# Patient Record
Sex: Male | Born: 1960 | Race: White | Hispanic: No | Marital: Married | Smoking: Never smoker
Health system: Southern US, Community
[De-identification: ages and names within clinical notes are randomized; demographics above are authoritative.]

## PROBLEM LIST (undated history)

## (undated) DIAGNOSIS — I10 Essential (primary) hypertension: Secondary | ICD-10-CM

## (undated) DIAGNOSIS — Z87442 Personal history of urinary calculi: Secondary | ICD-10-CM

## (undated) DIAGNOSIS — K219 Gastro-esophageal reflux disease without esophagitis: Secondary | ICD-10-CM

## (undated) DIAGNOSIS — T8859XA Other complications of anesthesia, initial encounter: Secondary | ICD-10-CM

## (undated) DIAGNOSIS — N50819 Testicular pain, unspecified: Secondary | ICD-10-CM

## (undated) DIAGNOSIS — C801 Malignant (primary) neoplasm, unspecified: Secondary | ICD-10-CM

## (undated) DIAGNOSIS — Z87438 Personal history of other diseases of male genital organs: Secondary | ICD-10-CM

## (undated) DIAGNOSIS — H8190 Unspecified disorder of vestibular function, unspecified ear: Secondary | ICD-10-CM

## (undated) DIAGNOSIS — E782 Mixed hyperlipidemia: Secondary | ICD-10-CM

## (undated) DIAGNOSIS — R42 Dizziness and giddiness: Secondary | ICD-10-CM

## (undated) DIAGNOSIS — G4730 Sleep apnea, unspecified: Secondary | ICD-10-CM

## (undated) HISTORY — PX: LITHOTRIPSY: SUR834

## (undated) HISTORY — DX: Personal history of other diseases of male genital organs: Z87.438

## (undated) HISTORY — DX: Testicular pain, unspecified: N50.819

## (undated) HISTORY — DX: Unspecified disorder of vestibular function, unspecified ear: H81.90

## (undated) HISTORY — DX: Mixed hyperlipidemia: E78.2

## (undated) HISTORY — DX: Dizziness and giddiness: R42

---

## 2002-08-01 HISTORY — PX: NASAL SEPTUM SURGERY: SHX37

## 2006-08-01 DIAGNOSIS — N50819 Testicular pain, unspecified: Secondary | ICD-10-CM

## 2006-08-01 HISTORY — DX: Testicular pain, unspecified: N50.819

## 2006-11-05 ENCOUNTER — Emergency Department: Payer: Self-pay | Admitting: Emergency Medicine

## 2006-11-14 ENCOUNTER — Emergency Department: Payer: Self-pay | Admitting: Emergency Medicine

## 2007-03-27 ENCOUNTER — Ambulatory Visit: Payer: Self-pay | Admitting: Unknown Physician Specialty

## 2007-03-29 ENCOUNTER — Ambulatory Visit: Payer: Self-pay | Admitting: Urology

## 2008-04-08 ENCOUNTER — Ambulatory Visit: Payer: Self-pay | Admitting: Urology

## 2008-04-10 ENCOUNTER — Ambulatory Visit: Payer: Self-pay | Admitting: Urology

## 2008-04-17 ENCOUNTER — Ambulatory Visit: Payer: Self-pay | Admitting: Urology

## 2008-04-29 ENCOUNTER — Ambulatory Visit: Payer: Self-pay | Admitting: Urology

## 2010-01-25 IMAGING — CR DG ABDOMEN 1V
1 series · 1 of 1 positions shown · non-contrast
Comparison: none

REASON FOR EXAM: renal calculi-lithotripsy
COMMENTS:

[view not recorded]
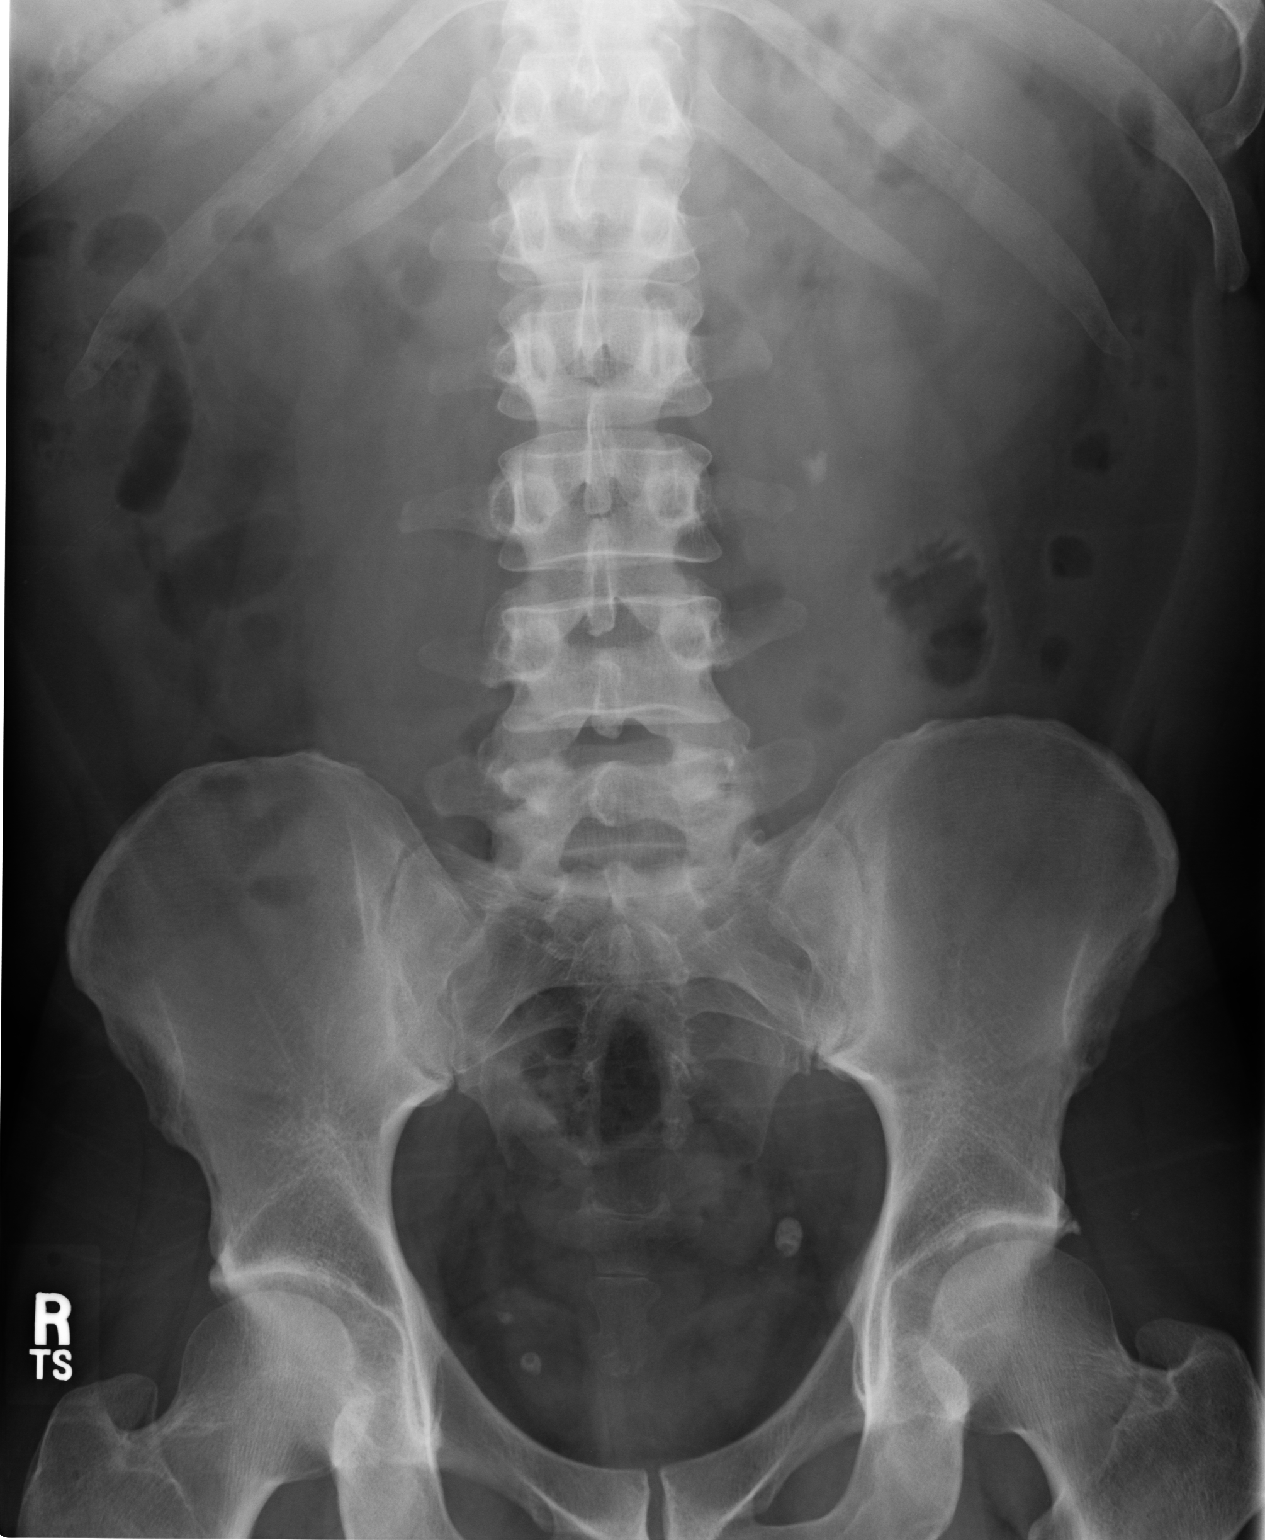

[1 of 1 positions shown; findings below may reference images not displayed]

PROCEDURE:     DXR - DXR KIDNEY URETER BLADDER  - April 17, 2008 [DATE]

RESULT:     Comparison is made to the prior exam of 04/08/2008. There is a 9
mm calcification projected superior to the L3 lumbar transverse process on
the LEFT and consistent with a LEFT ureteral stone that does not appear
appreciably changed in position as compared to the exam of 04/08/2008. No
other calcifications suspicious for renal or ureteral stones are seen.
IMPRESSION: 1.     There is again noted a calcific density superior to the L3 lumbar
transverse process on the LEFT and consistent with a LEFT ureteral stone.

## 2012-01-16 ENCOUNTER — Ambulatory Visit (INDEPENDENT_AMBULATORY_CARE_PROVIDER_SITE_OTHER): Payer: PRIVATE HEALTH INSURANCE | Admitting: Internal Medicine

## 2012-01-16 ENCOUNTER — Encounter: Payer: Self-pay | Admitting: Internal Medicine

## 2012-01-16 VITALS — BP 130/84 | HR 95 | Temp 98.1°F | Resp 16 | Ht 68.0 in | Wt 208.8 lb

## 2012-01-16 DIAGNOSIS — Z131 Encounter for screening for diabetes mellitus: Secondary | ICD-10-CM

## 2012-01-16 DIAGNOSIS — L255 Unspecified contact dermatitis due to plants, except food: Secondary | ICD-10-CM

## 2012-01-16 DIAGNOSIS — E785 Hyperlipidemia, unspecified: Secondary | ICD-10-CM

## 2012-01-16 DIAGNOSIS — Z0000 Encounter for general adult medical examination without abnormal findings: Secondary | ICD-10-CM

## 2012-01-16 DIAGNOSIS — B356 Tinea cruris: Secondary | ICD-10-CM

## 2012-01-16 DIAGNOSIS — L259 Unspecified contact dermatitis, unspecified cause: Secondary | ICD-10-CM

## 2012-01-16 DIAGNOSIS — L237 Allergic contact dermatitis due to plants, except food: Secondary | ICD-10-CM

## 2012-01-16 MED ORDER — NYSTATIN 100000 UNIT/GM EX POWD: Freq: Four times a day (QID) | CUTANEOUS | Status: AC

## 2012-01-16 MED ORDER — PREDNISONE (PAK) 10 MG PO TABS: ORAL_TABLET | ORAL | Status: AC

## 2012-01-16 NOTE — Progress Notes (Signed)
Patient ID: Plummer Matich, male   DOB: June 28, 1961, 51 y.o.   MRN: [id number] Patient Active Problem List  Diagnosis  . Routine general medical examination at a health care facility  . Jock itch  . Episodic recurrent vertigo  . History of acute prostatitis  . Contact dermatitis due to poison ivy    Subjective:  CC:   Chief Complaint  Patient presents with  . Annual Exam    HPI:   Kendricks Reap a 51 y.o. male who presents Annual exam.  Feels generally well.  No insomnia .  Walking for exercise 5 days per week.  No joint pain.  Has been trying to resolve a contact dermatitis on legs/arms for 4 weeks secondary to poison ivy/sumac,  Has tried calamine then fluocinolide ointment borrowed from parents  .  Left lateral legthe worst affected, with large patch covering 50% of lower leg.   2nd issue is recurrence of reflux aggravated by food choices. Not daily.  3rd issue is a skin tag on right inner thigh.    Past Medical History  Diagnosis Date  . Episodic recurrent vertigo     evaluated by Dr. Jenne Campus with normal MRI  . Testicular pain 2008    prostatis, treated with Flomax by Dr. Achilles Dunk  . Episodic recurrent vertigo     normal MRI   . History of acute prostatitis Fall 2008    Past Surgical History  Procedure Date  . Nasal septum surgery 2004         The following portions of the patient's history were reviewed and updated as appropriate: Allergies, current medications, and problem list.    Review of Systems:   12 Pt  review of systems was negative except those addressed in the HPI,     History   Social History  . Marital Status: Married    Spouse Name: N/A    Number of Children: N/A  . Years of Education: N/A   Occupational History  . Not on file.   Social History Main Topics  . Smoking status: Never Smoker   . Smokeless tobacco: Never Used  . Alcohol Use: No  . Drug Use: No  . Sexually Active: Not on file   Other Topics Concern  . Not on file   Social  History Narrative  . No narrative on file    Objective:  BP 130/84  Pulse 95  Temp 98.1 F (36.7 C) (Oral)  Resp 16  Ht 5\' 8"  (1.727 m)  Wt 208 lb 12 oz (94.688 kg)  BMI 31.74 kg/m2  SpO2 98%  General appearance: alert, cooperative and appears stated age Ears: normal TM's and external ear canals both ears Throat: lips, mucosa, and tongue normal; teeth and gums normal Neck: no adenopathy, no carotid bruit, supple, symmetrical, trachea midline and thyroid not enlarged, symmetric, no tenderness/mass/nodules Back: symmetric, no curvature. ROM normal. No CVA tenderness. Lungs: clear to auscultation bilaterally Heart: regular rate and rhythm, S1, S2 normal, no murmur, click, rub or gallop Abdomen: soft, non-tender; bowel sounds normal; no masses,  no organomegaly Pulses: 2+ and symmetric Skin: large erythematous papular rash left lower extremity, several linear stripes on arms.  Inguinal:red macular rash inguinal area. Skin tag ,, pedunculated left inner thigh.  Urologic: testicular exam normal, uncircumcised penis normal Lymph nodes: Cervical, supraclavicular, and axillary nodes normal.  Assessment and Plan:  Routine general medical examination at a health care facility Annual exam done except for DRE.  Visual acuity 20/15 wearing his corrective  lenses  Jock itch Trial of nystatin powder bid x 2 weeks, then switch to Gold Bond.   Contact dermatitis due to poison ivy Persistent,  X 4 weeks,  predisone taper. rxed   Updated Medication List Outpatient Encounter Prescriptions as of 01/16/2012  Medication Sig Dispense Refill  . nystatin (MYCOSTATIN) powder Apply topically 4 (four) times daily.  15 g  0  . predniSONE (STERAPRED UNI-PAK) 10 MG tablet 6 tablets on Day 1 , then reduce by 1 tablet daily until gone  21 tablet  0  . DISCONTD: Tamsulosin HCl (FLOMAX) 0.4 MG CAPS Take by mouth.         Orders Placed This Encounter  Procedures  . Lipid panel  . COMPLETE METABOLIC  PANEL WITH GFR    No Follow-up on file.

## 2012-01-16 NOTE — Assessment & Plan Note (Addendum)
Annual exam done except for DRE.  Visual acuity 20/15 wearing his corrective lenses

## 2012-01-16 NOTE — Patient Instructions (Addendum)
Try pepcid (famotidine) 20 mg as needed for reflux,  Max dose 40 mg daily  ,  Works faster than prilosec  Consider the Low Glycemic Index Diet and 6 smaller meals daily .  This boosts your metabolism and regulates your sugars:   7 AM Low carbohydrate Protein  Shakes (EAS Carb Control  Or Atkins ,  Available everywhere,   In  cases at [employer] )  2.5 carbs  (Add or substitute a toasted sandwhich thin w/ peanut butter)  10 AM: Protein bar by Atkins (snack size,  Chocolate lover's variety at  [employer])    Lunch: sandwich on pita bread or flatbread (Joseph's makes a pita bread and a flat bread , available at [employer] and [employer]; Toufayah makes a low carb flatbread available at [employer] and [employer]) Mission makes a low carb whole wheat tortilla available at [employer] most grocery stores   3 PM:  Mid day :  Another protein bar,  Or a  cheese stick, 1/4 cup of almonds, walnuts, pistachios, pecans, peanuts,  Macadamia nuts  6 PM  Dinner:  "mean and green:"  Meat/chicken/fish, salad, and green veggie : use ranch, vinagrette,  Blue cheese, etc  9 PM snack : Breyer's low carb fudgsicle or  ice cream bar (Carb Smart), or  Weight Watcher's ice cream bar , or another protein shake

## 2012-01-17 ENCOUNTER — Encounter: Payer: Self-pay | Admitting: Internal Medicine

## 2012-01-17 ENCOUNTER — Other Ambulatory Visit (INDEPENDENT_AMBULATORY_CARE_PROVIDER_SITE_OTHER): Payer: PRIVATE HEALTH INSURANCE | Admitting: *Deleted

## 2012-01-17 DIAGNOSIS — L237 Allergic contact dermatitis due to plants, except food: Secondary | ICD-10-CM | POA: Insufficient documentation

## 2012-01-17 DIAGNOSIS — Z131 Encounter for screening for diabetes mellitus: Secondary | ICD-10-CM

## 2012-01-17 DIAGNOSIS — E785 Hyperlipidemia, unspecified: Secondary | ICD-10-CM

## 2012-01-17 DIAGNOSIS — R42 Dizziness and giddiness: Secondary | ICD-10-CM | POA: Insufficient documentation

## 2012-01-17 DIAGNOSIS — Z87438 Personal history of other diseases of male genital organs: Secondary | ICD-10-CM | POA: Insufficient documentation

## 2012-01-17 LAB — COMPLETE METABOLIC PANEL WITH GFR
ALT: 14 U/L (ref 0–53)
AST: 17 U/L (ref 0–37)
Albumin: 4.5 g/dL (ref 3.5–5.2)
Alkaline Phosphatase: 61 U/L (ref 39–117)
GFR, Est Non African American: >89 mL/min
Glucose, Bld: 116 mg/dL — ABNORMAL HIGH (ref 70–99)
Potassium: 4.6 mEq/L (ref 3.5–5.3)
Sodium: 140 mEq/L (ref 135–145)
Total Bilirubin: 0.6 mg/dL (ref 0.3–1.2)
Total Protein: 7.3 g/dL (ref 6.0–8.3)

## 2012-01-17 LAB — LIPID PANEL
HDL: 49.1 mg/dL (ref >39.00)
Total CHOL/HDL Ratio: 5
Triglycerides: 115 mg/dL (ref 0.0–149.0)
VLDL: 23 mg/dL (ref 0.0–40.0)

## 2012-01-17 NOTE — Assessment & Plan Note (Signed)
Trial of nystatin powder bid x 2 weeks, then switch to Gold Bond.

## 2012-01-17 NOTE — Assessment & Plan Note (Signed)
Persistent,  X 4 weeks,  predisone taper. rxed

## 2012-01-27 ENCOUNTER — Encounter: Payer: Self-pay | Admitting: Internal Medicine

## 2012-03-06 ENCOUNTER — Telehealth: Payer: Self-pay | Admitting: Internal Medicine

## 2012-03-06 DIAGNOSIS — L255 Unspecified contact dermatitis due to plants, except food: Secondary | ICD-10-CM

## 2012-03-06 MED ORDER — PREDNISONE (PAK) 10 MG PO TABS: ORAL_TABLET | ORAL | Status: DC

## 2012-03-06 NOTE — Telephone Encounter (Signed)
Left message asking patient to call back

## 2012-03-06 NOTE — Telephone Encounter (Signed)
Caller: Tyler Peters; PCP: Duncan Dull; CB#: [phone]; Call regarding Poison Ivy/[place]ak;  Not responding to home treatment.  Onset 03/01/12.  Areas involved:  right lower leg, arm, chest axilla, thigh. Advised see in 24 hours per Poison [place], [place],  or [facility] protocol.  Caller requests to be seen today if possible and none available in Epic.    Note to office for possible work in appointment.

## 2012-03-06 NOTE — Telephone Encounter (Signed)
Patient notified appt scheduled for tomorrow.

## 2012-03-06 NOTE — Telephone Encounter (Signed)
Yes, but if he is diffusely covered in poisoin ivy,  He needs a prednsione dose pack to start today.  I  will call in to his pharmacy.  Follow up lateri n week with me.

## 2012-03-06 NOTE — Telephone Encounter (Signed)
Dr.Tullo you have no appts available for today, is it ok for patient to be seen tomorrow?

## 2012-03-07 ENCOUNTER — Ambulatory Visit (INDEPENDENT_AMBULATORY_CARE_PROVIDER_SITE_OTHER): Payer: PRIVATE HEALTH INSURANCE | Admitting: Internal Medicine

## 2012-03-07 ENCOUNTER — Encounter: Payer: Self-pay | Admitting: Internal Medicine

## 2012-03-07 VITALS — BP 126/94 | HR 114 | Temp 98.6°F | Resp 16 | Wt 207.5 lb

## 2012-03-07 DIAGNOSIS — L255 Unspecified contact dermatitis due to plants, except food: Secondary | ICD-10-CM

## 2012-03-07 DIAGNOSIS — L237 Allergic contact dermatitis due to plants, except food: Secondary | ICD-10-CM

## 2012-03-08 ENCOUNTER — Encounter: Payer: Self-pay | Admitting: Internal Medicine

## 2012-03-08 DIAGNOSIS — E782 Mixed hyperlipidemia: Secondary | ICD-10-CM | POA: Insufficient documentation

## 2012-03-08 NOTE — Progress Notes (Signed)
Patient ID: Tyler Peters, male   DOB: 07-31-1961, 51 y.o.   MRN: [id number]  Patient Active Problem List  Diagnosis  . Routine general medical examination at a health care facility  . Jock itch  . Episodic recurrent vertigo  . History of acute prostatitis  . Contact dermatitis due to poison ivy  . Hyperlipidemia, mixed    Subjective:  CC:   Chief Complaint  Patient presents with  . Poison Ivy    HPI:   Tyler Peters a 51 y.o. male who presents Contact dermatitis secondary to contact with poison ivy in his yard.  Has spread to arms and axilla from right LE.  Using an OTC soap that has helped to remove the oils,  But leg is very enflamed, and priro treatment with prednisone taper worked rapidly to relieve disomfort.    Past Medical History  Diagnosis Date  . Episodic recurrent vertigo     evaluated by Dr. Jenne Campus with normal MRI  . Testicular pain 2008    prostatis, treated with Flomax by Dr. Achilles Dunk  . Episodic recurrent vertigo     normal MRI   . History of acute prostatitis Fall 2008  . Hyperlipidemia, mixed     Past Surgical History  Procedure Date  . Nasal septum surgery 2004         The following portions of the patient's history were reviewed and updated as appropriate: Allergies, current medications, and problem list.    Review of Systems:   12 Pt  review of systems was negative except those addressed in the HPI,     History   Social History  . Marital Status: Married    Spouse Name: N/A    Number of Children: N/A  . Years of Education: N/A   Occupational History  . Not on file.   Social History Main Topics  . Smoking status: Never Smoker   . Smokeless tobacco: Never Used  . Alcohol Use: No  . Drug Use: No  . Sexually Active: Not on file   Other Topics Concern  . Not on file   Social History Narrative  . No narrative on file    Objective:  BP 126/94  Pulse 114  Temp 98.6 F (37 C) (Oral)  Resp 16  Wt 207 lb 8 oz (94.121 kg)   SpO2 96%  General appearance: alert, cooperative and appears stated age  Skin: 6 cm ellipitical shaped  patch of erythema, well demarcated,  Right shin.  Scattered linear streaks on both forearms. No cellulitis.  Lymph nodes: Cervical, supraclavicular, and axillary nodes normal.  Assessment and Plan:  Contact dermatitis due to poison ivy Repeat episode due to contact with poisin ivy .  6 Day steroid taper prescribed.    Updated Medication List Outpatient Encounter Prescriptions as of 03/07/2012  Medication Sig Dispense Refill  . nystatin (MYCOSTATIN) powder Apply topically 4 (four) times daily.  15 g  0  . predniSONE (STERAPRED UNI-PAK) 10 MG tablet 6 tablets on Day 1 , then reduce by 1 tablet daily until gone  21 tablet  0     No orders of the defined types were placed in this encounter.    No Follow-up on file.

## 2012-03-08 NOTE — Assessment & Plan Note (Signed)
Repeat episode due to contact with poisin ivy .  6 Day steroid taper prescribed.

## 2012-03-12 ENCOUNTER — Encounter: Payer: Self-pay | Admitting: Internal Medicine

## 2012-03-12 DIAGNOSIS — L309 Dermatitis, unspecified: Secondary | ICD-10-CM

## 2012-03-12 MED ORDER — PREDNISONE 10 MG PO TABS: ORAL_TABLET | ORAL | Status: DC

## 2012-09-16 ENCOUNTER — Other Ambulatory Visit: Payer: Self-pay

## 2013-01-16 ENCOUNTER — Encounter: Payer: PRIVATE HEALTH INSURANCE | Admitting: Internal Medicine

## 2013-04-16 ENCOUNTER — Encounter: Payer: PRIVATE HEALTH INSURANCE | Admitting: Internal Medicine

## 2013-06-03 ENCOUNTER — Encounter: Payer: Self-pay | Admitting: Internal Medicine

## 2013-06-03 ENCOUNTER — Ambulatory Visit (INDEPENDENT_AMBULATORY_CARE_PROVIDER_SITE_OTHER): Payer: Managed Care, Other (non HMO) | Admitting: Internal Medicine

## 2013-06-03 VITALS — BP 130/94 | HR 86 | Temp 98.7°F | Resp 12 | Ht 68.5 in | Wt 215.0 lb

## 2013-06-03 DIAGNOSIS — I10 Essential (primary) hypertension: Secondary | ICD-10-CM | POA: Insufficient documentation

## 2013-06-03 DIAGNOSIS — E785 Hyperlipidemia, unspecified: Secondary | ICD-10-CM

## 2013-06-03 DIAGNOSIS — R030 Elevated blood-pressure reading, without diagnosis of hypertension: Secondary | ICD-10-CM

## 2013-06-03 DIAGNOSIS — E559 Vitamin D deficiency, unspecified: Secondary | ICD-10-CM

## 2013-06-03 DIAGNOSIS — R5381 Other malaise: Secondary | ICD-10-CM

## 2013-06-03 DIAGNOSIS — Z0000 Encounter for general adult medical examination without abnormal findings: Secondary | ICD-10-CM

## 2013-06-03 DIAGNOSIS — E669 Obesity, unspecified: Secondary | ICD-10-CM

## 2013-06-03 DIAGNOSIS — Z23 Encounter for immunization: Secondary | ICD-10-CM

## 2013-06-03 LAB — CBC WITH DIFFERENTIAL/PLATELET
Basophils Absolute: 0 10*3/uL (ref 0.0–0.1)
Basophils Relative: 0.2 % (ref 0.0–3.0)
Eosinophils Absolute: 0.1 10*3/uL (ref 0.0–0.7)
Lymphocytes Relative: 19.8 % (ref 12.0–46.0)
MCHC: 33 g/dL (ref 30.0–36.0)
Neutrophils Relative %: 72.1 % (ref 43.0–77.0)
RBC: 5.55 Mil/uL (ref 4.22–5.81)
RDW: 14.1 % (ref 11.5–14.6)

## 2013-06-03 LAB — TSH: TSH: 1.36 u[IU]/mL (ref 0.35–5.50)

## 2013-06-03 LAB — LIPID PANEL
Cholesterol: 216 mg/dL — ABNORMAL HIGH (ref 0–200)
Triglycerides: 232 mg/dL — ABNORMAL HIGH (ref 0.0–149.0)

## 2013-06-03 LAB — COMPREHENSIVE METABOLIC PANEL
ALT: 21 U/L (ref 0–53)
CO2: 28 mEq/L (ref 19–32)
Calcium: 9.5 mg/dL (ref 8.4–10.5)
Chloride: 103 mEq/L (ref 96–112)
Creatinine, Ser: 1.1 mg/dL (ref 0.4–1.5)
GFR: 77.78 mL/min (ref >60.00)

## 2013-06-03 NOTE — Assessment & Plan Note (Signed)
Patient checks BP at [facility] and is consistently < 120/80.  No treatment today. Return for fasting labs.

## 2013-06-03 NOTE — Assessment & Plan Note (Signed)
Annual male exam was done including testicular  exam.

## 2013-06-03 NOTE — Assessment & Plan Note (Signed)
I have addressed  BMI and recommended wt loss of 10% of body weigh over the next 6 months using a low glycemic index diet and regular exercise a minimum of 5 days per week.   

## 2013-06-03 NOTE — Progress Notes (Signed)
Patient ID: Tyler Peters, male   DOB: April 04, 1961, 52 y.o.   MRN: [id number]  The patient is here for his annual male physical examination. He was last seen August 2013.  Still receivng "balance due" charges from [facility] for the prior visit that was supposed to be written off.   No new issues other than recurrent sinus congestion , sore throat,  Some cough and PND.  No fevers or myalgias.     The risk factors are reflected in the social history.  The roster of all physicians providing medical care to patient - is listed in the Snapshot section of the chart.  Activities of daily living:  The patient is 100% independent in all ADLs: dressing, toileting, feeding as well as independent mobility  Home safety : The patient has smoke detectors in the home. He wears seatbelts.  There are no firearms at home. There is no violence in the home.   There is no risks for hepatitis, STDs or HIV. There is no   history of blood transfusion. There is no travel history to infectious disease endemic areas of the world.  The patient has seen their dentist in the last six month and  their eye doctor in the last year.  They do not  have excessive sun exposure. They have seen a dermatoloigist in the last year. Discussed the need for sun protection: hats, long sleeves and use of sunscreen if there is significant sun exposure.   Diet: the importance of a healthy diet is discussed. They do have a healthy diet.  The benefits of regular aerobic exercise were discussed. He exercises a minimum of 30 minutes  5 days per week. Depression screen: there are no signs or vegative symptoms of depression- irritability, change in appetite, anhedonia, sadness/tearfullness.  The following portions of the patient's history were reviewed and updated as appropriate: allergies, current medications, past family history, past medical history,  past surgical history, past social history  and problem list.  Visual acuity was not assessed per  patient preference since he has regular follow up with his ophthalmologist. Hearing and body mass index were assessed and reviewed.   During the course of the visit the patient was educated and counseled about appropriate screening and preventive services including :  nutrition counseling, colorectal cancer screening, and recommended immunizations.    Objective:   BP 130/94  Pulse 86  Temp(Src) 98.7 F (37.1 C) (Oral)  Resp 12  Ht 5' 8.5" (1.74 m)  Wt 215 lb (97.523 kg)  BMI 32.21 kg/m2  SpO2 98%  General Appearance:    Alert, cooperative, no distress, appears stated age  Head:    Normocephalic, without obvious abnormality, atraumatic  Eyes:    PERRL, conjunctiva/corneas clear, EOM's intact, fundi    benign, both eyes       Ears:    Normal TM's and external ear canals, both ears  Nose:   Nares normal, septum midline, mucosa normal, no drainage   or sinus tenderness  Throat:   Lips, mucosa, and tongue normal; teeth and gums normal  Neck:   Supple, symmetrical, trachea midline, no adenopathy;       thyroid:  No enlargement/tenderness/nodules; no carotid   bruit or JVD  Back:     Symmetric, no curvature, ROM normal, no CVA tenderness  Lungs:     Clear to auscultation bilaterally, respirations unlabored  Chest wall:    No tenderness or deformity  Heart:    Regular rate and rhythm, S1 and  S2 normal, no murmur, rub   or gallop  Abdomen:     Soft, non-tender, bowel sounds active all four quadrants,    no masses, no organomegaly  Genitalia:    Normal male without lesion, discharge or tendernessNo inguinal hernias or testicular massess.     Extremities:   Extremities normal, atraumatic, no cyanosis or edema  Pulses:   2+ and symmetric all extremities  Skin:   Skin color, texture, turgor normal, no rashes or lesions  Lymph nodes:   Cervical, supraclavicular, and axillary nodes normal  Neurologic:   CNII-XII intact. Normal strength, sensation and reflexes      throughout   Assessment  and plan:   Routine general medical examination at a health care facility Annual male exam was done including testicular  exam.   White coat syndrome without diagnosis of hypertension [facility] and is consistently < 120/80.  No treatment today. Return for fasting labs.  Obesity, unspecified I have addressed  BMI and recommended wt loss of 10% of body weigh over the next 6 months using a low glycemic index diet and regular exercise a minimum of 5 days per week.     Updated Medication List Outpatient Encounter Prescriptions as of 06/03/2013  Medication Sig  . [DISCONTINUED] predniSONE (DELTASONE) 10 MG tablet 3 tablets daily for 2 days , decrease by 1 tablet every 2 days until gone.

## 2014-11-10 ENCOUNTER — Encounter: Payer: Self-pay | Admitting: Internal Medicine

## 2014-11-10 ENCOUNTER — Ambulatory Visit (INDEPENDENT_AMBULATORY_CARE_PROVIDER_SITE_OTHER): Payer: Managed Care, Other (non HMO) | Admitting: Internal Medicine

## 2014-11-10 VITALS — BP 128/70 | HR 96 | Temp 98.3°F | Resp 16 | Ht 67.0 in | Wt 217.5 lb

## 2014-11-10 DIAGNOSIS — R05 Cough: Secondary | ICD-10-CM

## 2014-11-10 DIAGNOSIS — J209 Acute bronchitis, unspecified: Secondary | ICD-10-CM | POA: Diagnosis not present

## 2014-11-10 DIAGNOSIS — R059 Cough, unspecified: Secondary | ICD-10-CM

## 2014-11-10 MED ORDER — HYDROCOD POLST-CHLORPHEN POLST 10-8 MG/5ML PO LQCR: 5.0000 mL | Freq: Every evening | ORAL | Status: DC | PRN

## 2014-11-10 MED ORDER — PREDNISONE 10 MG PO TABS: ORAL_TABLET | ORAL | Status: DC

## 2014-11-10 MED ORDER — BENZONATATE 200 MG PO CAPS: 200.0000 mg | ORAL_CAPSULE | Freq: Three times a day (TID) | ORAL | Status: DC | PRN

## 2014-11-10 MED ORDER — AZITHROMYCIN 500 MG PO TABS: 500.0000 mg | ORAL_TABLET | Freq: Every day | ORAL | Status: DC

## 2014-11-10 NOTE — Progress Notes (Signed)
Pre-visit discussion using our clinic review tool. No additional management support is needed unless otherwise documented below in the visit note.  

## 2014-11-10 NOTE — Progress Notes (Signed)
Patient ID: Tyler Peters, male   DOB: May 25, 1961, 54 y.o.   MRN: [id number]  Patient Active Problem List   Diagnosis Date Noted  . Acute bronchitis 11/11/2014  . White coat syndrome without diagnosis of hypertension 06/03/2013  . Obesity, unspecified 06/03/2013  . Hyperlipidemia, mixed   . Episodic recurrent vertigo   . History of acute prostatitis   . Routine general medical examination at a health care facility 01/16/2012    Subjective:  CC:   Chief Complaint  Patient presents with  . Cough    X 4 weeks sore throat and achy to start in March now head congestion and runny nose.  . Sinusitis    Drainage to back of throat. cough productive at times does not know color.    HPI:   Tyler Peters is a 54 y.o. male who presents for 4 week history of cough .  Symptoms started the weekend before Easter with a sore throat,  Generalized body aches, so severe he left work early.  Slept for 6 hours that day same day .  achiness improved but sinus congestion persisted.  She has been having daily headaches,  Copious sinus drainage and cough.  Chest sometimes hurts to cough .  Right maxillary pain ,  Cough makes his head hurt on the right,  Tried all otc decongestants and Mucinex     Past Medical History  Diagnosis Date  . Episodic recurrent vertigo     evaluated by Dr. Tami Ribas with normal MRI  . Testicular pain 2008    prostatis, treated with Flomax by Dr. Jacqlyn Larsen  . Episodic recurrent vertigo     normal MRI   . History of acute prostatitis Fall 2008  . Hyperlipidemia, mixed     Past Surgical History  Procedure Laterality Date  . Nasal septum surgery  2004       The following portions of the patient's history were reviewed and updated as appropriate: Allergies, current medications, and problem list.    Review of Systems:   Patient denies headache, fevers, malaise, unintentional weight loss, skin rash, eye pain, sinus congestion and sinus pain, sore throat, dysphagia,   hemoptysis , cough, dyspnea, wheezing, chest pain, palpitations, orthopnea, edema, abdominal pain, nausea, melena, diarrhea, constipation, flank pain, dysuria, hematuria, urinary  Frequency, nocturia, numbness, tingling, seizures,  Focal weakness, Loss of consciousness,  Tremor, insomnia, depression, anxiety, and suicidal ideation.     History   Social History  . Marital Status: Married    Spouse Name: N/A  . Number of Children: N/A  . Years of Education: N/A   Occupational History  . Not on file.   Social History Main Topics  . Smoking status: Never Smoker   . Smokeless tobacco: Never Used  . Alcohol Use: No  . Drug Use: No  . Sexual Activity: Not on file   Other Topics Concern  . Not on file   Social History Narrative    Objective:  Filed Vitals:   11/10/14 1651  BP: 128/70  Pulse: 96  Temp: 98.3 F (36.8 C)  Resp: 16     General appearance: alert, cooperative and appears stated age Ears: normal TM's and external ear canals both ears Throat: lips, mucosa, and tongue normal; teeth and gums normal Neck: no adenopathy, no carotid bruit, supple, symmetrical, trachea midline and thyroid not enlarged, symmetric, no tenderness/mass/nodules Back: symmetric, no curvature. ROM normal. No CVA tenderness. Lungs: clear to auscultation bilaterally Heart: regular rate and rhythm, S1, S2  normal, no murmur, click, rub or gallop Abdomen: soft, non-tender; bowel sounds normal; no masses,  no organomegaly Pulses: 2+ and symmetric Skin: Skin color, texture, turgor normal. No rashes or lesions Lymph nodes: Cervical, supraclavicular, and axillary nodes normal.  Assessment and Plan:  Acute bronchitis Suspect pertussis.  Nasal swab pending.  Treat with azithromycin,  Prednisone and cough suppression.      Updated Medication List Outpatient Encounter Prescriptions as of 11/10/2014  Medication Sig  . dextromethorphan-guaiFENesin (MUCINEX DM) 30-600 MG per 12 hr tablet Take 1 tablet  by mouth 2 (two) times daily.  . Pseudoeph-Doxylamine-DM-APAP (NYQUIL PO) Take 30 mLs by mouth at bedtime.  . Pseudoephedrine-APAP-DM (DAYQUIL PO) Take 2 capsules by mouth 2 (two) times daily.  Marland Kitchen azithromycin (ZITHROMAX) 500 MG tablet Take 1 tablet (500 mg total) by mouth daily.  . benzonatate (TESSALON) 200 MG capsule Take 1 capsule (200 mg total) by mouth 3 (three) times daily as needed for cough.  . chlorpheniramine-HYDROcodone (TUSSIONEX) 10-8 MG/5ML LQCR Take 5 mLs by mouth at bedtime as needed for cough.  . predniSONE (DELTASONE) 10 MG tablet 6 tablets on Day 1 , then reduce by 1 tablet daily until gone     Orders Placed This Encounter  Procedures  . Bordetella Pertussis PCR    No Follow-up on file.

## 2014-11-10 NOTE — Patient Instructions (Addendum)
I am treating you for pertussis based on the severity of your cough and the chroncity of it.   Azithromycin x 7 days and prednisone taper. Tessalon for daytime cough and tussionex for nighttime cough  Flush sinues twice daily with Neilmed sinus rinse  Add benadryl for sinus drainage  If no improvement in one week  Call for chest x ray and second round of abx    Please take a probiotic ( Align, Floraque or Culturelle) , or the generic equivalent for a minimum of 3 weeks while you are on the antibiotic to prevent a serious antibiotic associated diarrhea  Called clostridium dificile colitis

## 2014-11-11 ENCOUNTER — Encounter: Payer: Self-pay | Admitting: Internal Medicine

## 2014-11-11 DIAGNOSIS — J209 Acute bronchitis, unspecified: Secondary | ICD-10-CM | POA: Insufficient documentation

## 2014-11-11 NOTE — Assessment & Plan Note (Signed)
Suspect pertussis.  Nasal swab pending.  Treat with azithromycin,  Prednisone and cough suppression.

## 2014-11-12 LAB — BORDETELLA PERTUSSIS PCR
B parapertussis, DNA: NOT DETECTED
B pertussis, DNA: NOT DETECTED

## 2014-11-14 ENCOUNTER — Encounter: Payer: Self-pay | Admitting: Internal Medicine

## 2015-01-07 ENCOUNTER — Encounter: Payer: Self-pay | Admitting: Internal Medicine

## 2015-01-07 MED ORDER — PREDNISONE 10 MG PO TABS: ORAL_TABLET | ORAL | Status: DC

## 2015-11-10 ENCOUNTER — Encounter: Payer: Self-pay | Admitting: Internal Medicine

## 2015-11-10 ENCOUNTER — Ambulatory Visit (INDEPENDENT_AMBULATORY_CARE_PROVIDER_SITE_OTHER): Payer: Managed Care, Other (non HMO) | Admitting: Internal Medicine

## 2015-11-10 VITALS — BP 148/100 | HR 113 | Temp 97.9°F | Resp 12 | Ht 68.0 in | Wt 225.5 lb

## 2015-11-10 DIAGNOSIS — IMO0001 Reserved for inherently not codable concepts without codable children: Secondary | ICD-10-CM

## 2015-11-10 DIAGNOSIS — E669 Obesity, unspecified: Secondary | ICD-10-CM

## 2015-11-10 DIAGNOSIS — E785 Hyperlipidemia, unspecified: Secondary | ICD-10-CM | POA: Diagnosis not present

## 2015-11-10 DIAGNOSIS — R5383 Other fatigue: Secondary | ICD-10-CM

## 2015-11-10 DIAGNOSIS — E559 Vitamin D deficiency, unspecified: Secondary | ICD-10-CM | POA: Diagnosis not present

## 2015-11-10 DIAGNOSIS — Z7289 Other problems related to lifestyle: Secondary | ICD-10-CM | POA: Diagnosis not present

## 2015-11-10 DIAGNOSIS — R635 Abnormal weight gain: Secondary | ICD-10-CM | POA: Diagnosis not present

## 2015-11-10 DIAGNOSIS — Z0000 Encounter for general adult medical examination without abnormal findings: Secondary | ICD-10-CM

## 2015-11-10 DIAGNOSIS — Z125 Encounter for screening for malignant neoplasm of prostate: Secondary | ICD-10-CM | POA: Diagnosis not present

## 2015-11-10 DIAGNOSIS — R030 Elevated blood-pressure reading, without diagnosis of hypertension: Secondary | ICD-10-CM | POA: Diagnosis not present

## 2015-11-10 DIAGNOSIS — E782 Mixed hyperlipidemia: Secondary | ICD-10-CM

## 2015-11-10 DIAGNOSIS — R0683 Snoring: Secondary | ICD-10-CM

## 2015-11-10 NOTE — Progress Notes (Signed)
Patient ID: Tyler Peters, male    DOB: 10-Feb-1961  Age: 55 y.o. MRN: [id number]  The patient is here for annual  wellness examination and management of other chronic and acute problems.   The risk factors are reflected in the social history.  The roster of all physicians providing medical care to patient - is listed in the Snapshot section of the chart.  Activities of daily living:  The patient is 100% independent in all ADLs: dressing, toileting, feeding as well as independent mobility  Home safety : The patient has smoke detectors in the home. They wear seatbelts.  There are no firearms at home. There is no violence in the home.   There is no risks for hepatitis, STDs or HIV. There is no   history of blood transfusion. They have no travel history to infectious disease endemic areas of the world.  The patient has seen their dentist in the last six month. They have seen their eye doctor in the last year. They admit to slight hearing difficulty with regard to whispered voices and some television programs.  They have deferred audiologic testing in the last year.  They do not  have excessive sun exposure. Discussed the need for sun protection: hats, long sleeves and use of sunscreen if there is significant sun exposure.   Diet: the importance of a healthy diet is discussed. They do not have a healthy diet  The benefits of regular aerobic exercise were discussed. He is not exercising regularly and has a gained 8 lbs since last year's visit.    Depression screen: there are no signs or vegative symptoms of depression- irritability, change in appetite, anhedonia, sadness/tearfullness.  Cognitive assessment: the patient manages all their financial and personal affairs and is actively engaged. They could relate day,date,year and events; recalled 2/3 objects at 3 minutes; performed clock-face test normally.  The following portions of the patient's history were reviewed and updated as appropriate:  allergies, current medications, past family history, past medical history,  past surgical history, past social history  and problem list.  Visual acuity was not assessed per patient preference since she has regular follow up with her ophthalmologist. Hearing and body mass index were assessed and reviewed.   During the course of the visit the patient was educated and counseled about appropriate screening and preventive services including : fall prevention , diabetes screening, nutrition counseling, colorectal cancer screening, and recommended immunizations.    CC: The primary encounter diagnosis was Other problems related to lifestyle. Diagnoses of Hyperlipidemia, Weight gain, Elevated blood pressure, Vitamin D deficiency, Other fatigue, Prostate cancer screening, Blood pressure elevated without history of HTN, Encounter for preventive health examination, White coat syndrome without diagnosis of hypertension, Hyperlipidemia, mixed, Obesity, unspecified, and Snoring were also pertinent to this visit.   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  averaging 60 hours per week .  Not exercising.  Eats fast food two times per week with french fries . Eats  Fish once a week .  No deep frying ,  Most meats are baked. Pasta 2-3 /week, rice 1/wk,  Potatoes 1/wk in addition to the FF.    No ETOH.Marland Kitchen ice cream rare,  Candy bar once in a while  No sweet tea , but drinks  One glass of soda daily .     History Tyler Peters has a past medical history of Episodic recurrent vertigo; Testicular pain (2008); Episodic recurrent vertigo; History  of acute prostatitis (Fall 2008); and Hyperlipidemia, mixed.   He has past surgical history that includes Nasal septum surgery (2004).   His family history includes Diabetes in his maternal grandmother; Heart disease in his father and maternal grandmother.He reports that he has never smoked. He has never used smokeless tobacco. He reports  that he does not drink alcohol or use illicit drugs.  Outpatient Prescriptions Prior to Visit  Medication Sig Dispense Refill  . azithromycin (ZITHROMAX) 500 MG tablet Take 1 tablet (500 mg total) by mouth daily. 7 tablet 0  . benzonatate (TESSALON) 200 MG capsule Take 1 capsule (200 mg total) by mouth 3 (three) times daily as needed for cough. 60 capsule 1  . chlorpheniramine-HYDROcodone (TUSSIONEX) 10-8 MG/5ML LQCR Take 5 mLs by mouth at bedtime as needed for cough. 180 mL 0  . dextromethorphan-guaiFENesin (MUCINEX DM) 30-600 MG per 12 hr tablet Take 1 tablet by mouth 2 (two) times daily.    . predniSONE (DELTASONE) 10 MG tablet 6 tablets on Day 1 , then reduce by 1 tablet daily until gone 21 tablet 0  . predniSONE (DELTASONE) 10 MG tablet 6 tablets on Days 1 and 2 , then reduce by 1 tablet daily until gone 27 tablet 0  . Pseudoeph-Doxylamine-DM-APAP (NYQUIL PO) Take 30 mLs by mouth at bedtime.    . Pseudoephedrine-APAP-DM (DAYQUIL PO) Take 2 capsules by mouth 2 (two) times daily.     No facility-administered medications prior to visit.    Review of Systems   Patient denies headache, fevers, malaise, unintentional weight loss, skin rash, eye pain, sinus congestion and sinus pain, sore throat, dysphagia,  hemoptysis , cough, dyspnea, wheezing, chest pain, palpitations, orthopnea, edema, abdominal pain, nausea, melena, diarrhea, constipation, flank pain, dysuria, hematuria, urinary  Frequency, nocturia, numbness, tingling, seizures,  Focal weakness, Loss of consciousness,  Tremor, insomnia, depression, anxiety, and suicidal ideation.      Objective:  BP 148/100 mmHg  Pulse 113  Temp(Src) 97.9 F (36.6 C) (Oral)  Resp 12  Ht 5\' 8"  (1.727 m)  Wt 225 lb 8 oz (102.286 kg)  BMI 34.30 kg/m2  SpO2 94%  Physical Exam  General appearance: obese, alert, cooperative and appears stated age Ears: normal TM's and external ear canals both ears Throat: lips, mucosa, and tongue normal; teeth and  gums normal Neck: no adenopathy, no carotid bruit, supple, symmetrical, trachea midline and thyroid not enlarged, symmetric, no tenderness/mass/nodules Back: symmetric, no curvature. ROM normal. No CVA tenderness. Lungs: clear to auscultation bilaterally Heart: regular rate and rhythm, S1, S2 normal, no murmur, click, rub or gallop Abdomen: soft, non-tender; bowel sounds normal; no masses,  no organomegaly Pulses: 2+ and symmetric Skin: Skin color, texture, turgor normal. No rashes or lesions Lymph nodes: Cervical, supraclavicular, and axillary nodes normal.   Assessment & Plan:   Problem List Items Addressed This Visit    Encounter for preventive health examination    Annual comprehensive preventive exam was done as well as an evaluation and management of acute and chronic conditions .  During the course of the visit the patient was educated and counseled about appropriate screening and preventive services including :  diabetes screening, lipid analysis with projected  10 year  risk for CAD , nutrition counseling, prostate and colorectal cancer screening, and recommended immunizations.  Printed recommendations for health maintenance screenings was given.       Hyperlipidemia, mixed    Based on your current lipid profile, the risk of clinically significant CAD is 16 %  over the next 10 years, using the Framingham risk calculator. Statin therapy will be advised. .    Lab Results  Component Value Date   CHOL 236* 11/10/2015   HDL 44.00 11/10/2015   LDLDIRECT 147.0 11/10/2015   TRIG 239.0* 11/10/2015   CHOLHDL 5 11/10/2015         White coat syndrome without diagnosis of hypertension    He has not been checking his BP at home and BP remains elevated upon recheck. Possible secondary to untreated OSA given history of snoring. Patient advised to purchase a home BP device and check pressures once daily until diagnosis can be confirmed.   Lab Results  Component Value Date   CREATININE  0.97 11/10/2015   Lab Results  Component Value Date   NA 142 11/10/2015   K 4.0 11/10/2015   CL 101 11/10/2015   CO2 29 11/10/2015   No results found for: MICROALBUR, [id number]      Obesity, unspecified    Weight gain addressed.  I have addressed  BMI and recommended wt loss of 10% of body weigh over the next 6 months using a low glycemic index diet and regular exercise a minimum of 5 days per week.        Snoring    Advised to consider sleep study given his obesity, snoring and elevated blood pressure.        Other Visit Diagnoses    Other problems related to lifestyle    -  Primary    Relevant Orders    Hepatitis C antibody (Completed)    HIV antibody (Completed)    Hyperlipidemia        Relevant Orders    Lipid panel (Completed)    Weight gain        Relevant Orders    TSH (Completed)    Elevated blood pressure        Relevant Orders    Comprehensive metabolic panel (Completed)    Vitamin D deficiency        Relevant Orders    VITAMIN D 25 Hydroxy (Vit-D Deficiency, Fractures) (Completed)    Other fatigue        Relevant Orders    CBC with Differential/Platelet (Completed)    Prostate cancer screening        Relevant Orders    PSA (Completed)    Blood pressure elevated without history of HTN        Relevant Orders    DME Other see comment       I have discontinued Mr. Drath Pseudoephedrine-APAP-DM (DAYQUIL PO), Pseudoeph-Doxylamine-DM-APAP (NYQUIL PO), dextromethorphan-guaiFENesin, azithromycin, predniSONE, benzonatate, chlorpheniramine-HYDROcodone, and predniSONE. I am also having him maintain his multivitamin with minerals.  Meds ordered this encounter  Medications  . Multiple Vitamins-Minerals (MULTIVITAMIN WITH MINERALS) tablet    Sig: Take 1 tablet by mouth daily.    Medications Discontinued During This Encounter  Medication Reason  . azithromycin (ZITHROMAX) 500 MG tablet Completed Course  . benzonatate (TESSALON) 200 MG capsule Completed  Course  . chlorpheniramine-HYDROcodone (TUSSIONEX) 10-8 MG/5ML LQCR Completed Course  . dextromethorphan-guaiFENesin (MUCINEX DM) 30-600 MG per 12 hr tablet Error  . predniSONE (DELTASONE) 10 MG tablet Completed Course  . predniSONE (DELTASONE) 10 MG tablet Error  . Pseudoeph-Doxylamine-DM-APAP (NYQUIL PO) Error  . Pseudoephedrine-APAP-DM (DAYQUIL PO) Error    Follow-up: No Follow-up on file.   Crecencio Mc, MD

## 2015-11-10 NOTE — Patient Instructions (Addendum)
Please purchase a BP cuff and check your BP at home  Goal is 130/80 or less; let me know what readings you are getting   Consider getting a sleep study test to evaluate your snoring /apnea  Colonoscopy advised.  Here the the docs in [place]:  Job Founds,  [facility]  [facility]  [facility]:  Romero Liner,  [place], and [place]  I recommend 30 minutes of exercise 5 days per week as your goal ,  And wt loss of 22 lbs over the next 6 months  Mediterranean style diet is best .  Low carb if your fasting sugar is elevated today.  Health Maintenance, Male A healthy lifestyle and preventative care can promote health and wellness.  Maintain regular health, dental, and eye exams.  Eat a healthy diet. Foods like vegetables, fruits, whole grains, low-fat dairy products, and lean protein foods contain the nutrients you need and are low in calories. Decrease your intake of foods high in solid fats, added sugars, and salt. Get information about a proper diet from your health care provider, if necessary.  Regular physical exercise is one of the most important things you can do for your health. Most adults should get at least 150 minutes of moderate-intensity exercise (any activity that increases your heart rate and causes you to sweat) each week. In addition, most adults need muscle-strengthening exercises on 2 or more days a week.   Maintain a healthy weight. The body mass index (BMI) is a screening tool to identify possible weight problems. It provides an estimate of body fat based on height and weight. Your health care provider can find your BMI and can help you achieve or maintain a healthy weight. For males 20 years and older:  A BMI below 18.5 is considered underweight.  A BMI of 18.5 to 24.9 is normal.  A BMI of 25 to 29.9 is considered overweight.  A BMI of 30 and above is considered obese.  Maintain normal blood lipids and cholesterol by exercising  and minimizing your intake of saturated fat. Eat a balanced diet with plenty of fruits and vegetables. Blood tests for lipids and cholesterol should begin at age 30 and be repeated every 5 years. If your lipid or cholesterol levels are high, you are over age 32, or you are at high risk for heart disease, you may need your cholesterol levels checked more frequently.Ongoing high lipid and cholesterol levels should be treated with medicines if diet and exercise are not working.  If you smoke, find out from your health care provider how to quit. If you do not use tobacco, do not start.  Lung cancer screening is recommended for adults aged 43-80 years who are at high risk for developing lung cancer because of a history of smoking. A yearly low-dose CT scan of the lungs is recommended for people who have at least a 30-pack-year history of smoking and are current smokers or have quit within the past 15 years. A pack year of smoking is smoking an average of 1 pack of cigarettes a day for 1 year (for example, a 30-pack-year history of smoking could mean smoking 1 pack a day for 30 years or 2 packs a day for 15 years). Yearly screening should continue until the smoker has stopped smoking for at least 15 years. Yearly screening should be stopped for people who develop a health problem that would prevent them from having lung cancer treatment.  If you choose  to drink alcohol, do not have more than 2 drinks per day. One drink is considered to be 12 oz (360 mL) of beer, 5 oz (150 mL) of wine, or 1.5 oz (45 mL) of liquor.  Avoid the use of street drugs. Do not share needles with anyone. Ask for help if you need support or instructions about stopping the use of drugs.  High blood pressure causes heart disease and increases the risk of stroke. High blood pressure is more likely to develop in:  People who have blood pressure in the end of the normal range (100-139/85-89 mm Hg).  People who are overweight or  obese.  People who are African American.  If you are 20-55 years of age, have your blood pressure checked every 3-5 years. If you are 49 years of age or older, have your blood pressure checked every year. You should have your blood pressure measured twice--once when you are at a hospital or clinic, and once when you are not at a hospital or clinic. Record the average of the two measurements. To check your blood pressure when you are not at a hospital or clinic, you can use:  An automated blood pressure machine at a pharmacy.  A home blood pressure monitor.  If you are 32-67 years old, ask your health care provider if you should take aspirin to prevent heart disease.  Diabetes screening involves taking a blood sample to check your fasting blood sugar level. This should be done once every 3 years after age 60 if you are at a normal weight and without risk factors for diabetes. Testing should be considered at a younger age or be carried out more frequently if you are overweight and have at least 1 risk factor for diabetes.  Colorectal cancer can be detected and often prevented. Most routine colorectal cancer screening begins at the age of 55 and continues through age 92. However, your health care provider may recommend screening at an earlier age if you have risk factors for colon cancer. On a yearly basis, your health care provider may provide home test kits to check for hidden blood in the stool. A small camera at the end of a tube may be used to directly examine the colon (sigmoidoscopy or colonoscopy) to detect the earliest forms of colorectal cancer. Talk to your health care provider about this at age 19 when routine screening begins. A direct exam of the colon should be repeated every 5-10 years through age 77, unless early forms of precancerous polyps or small growths are found.  People who are at an increased risk for hepatitis B should be screened for this virus. You are considered at high risk  for hepatitis B if:  You were born in a country where hepatitis B occurs often. Talk with your health care provider about which countries are considered high risk.  Your parents were born in a high-risk country and you have not received a shot to protect against hepatitis B (hepatitis B vaccine).  You have HIV or AIDS.  You use needles to inject street drugs.  You live with, or have sex with, someone who has hepatitis B.  You are a man who has sex with other men (MSM).  You get hemodialysis treatment.  You take certain medicines for conditions like cancer, organ transplantation, and autoimmune conditions.  Hepatitis C blood testing is recommended for all people born from [birth year] through 1963 and any individual with known risk factors for hepatitis C.  Healthy men  should no longer receive prostate-specific antigen (PSA) blood tests as part of routine cancer screening. Talk to your health care provider about prostate cancer screening.  Testicular cancer screening is not recommended for adolescents or adult males who have no symptoms. Screening includes self-exam, a health care provider exam, and other screening tests. Consult with your health care provider about any symptoms you have or any concerns you have about testicular cancer.  Practice safe sex. Use condoms and avoid high-risk sexual practices to reduce the spread of sexually transmitted infections (STIs).  You should be screened for STIs, including gonorrhea and chlamydia if:  You are sexually active and are younger than 24 years.  You are older than 24 years, and your health care provider tells you that you are at risk for this type of infection.  Your sexual activity has changed since you were last screened, and you are at an increased risk for chlamydia or gonorrhea. Ask your health care provider if you are at risk.  If you are at risk of being infected with HIV, it is recommended that you take a prescription medicine daily to  prevent HIV infection. This is called pre-exposure prophylaxis (PrEP). You are considered at risk if:  You are a man who has sex with other men (MSM).  You are a heterosexual man who is sexually active with multiple partners.  You take drugs by injection.  You are sexually active with a partner who has HIV.  Talk with your health care provider about whether you are at high risk of being infected with HIV. If you choose to begin PrEP, you should first be tested for HIV. You should then be tested every 3 months for as long as you are taking PrEP.  Use sunscreen. Apply sunscreen liberally and repeatedly throughout the day. You should seek shade when your shadow is shorter than you. Protect yourself by wearing long sleeves, pants, a wide-brimmed hat, and sunglasses year round whenever you are outdoors.  Tell your health care provider of new moles or changes in moles, especially if there is a change in shape or color. Also, tell your health care provider if a mole is larger than the size of a pencil eraser.  A one-time screening for abdominal aortic aneurysm (AAA) and surgical repair of large AAAs by ultrasound is recommended for men aged 92-75 years who are current or former smokers.  Stay current with your vaccines (immunizations).   This information is not intended to replace advice given to you by your health care provider. Make sure you discuss any questions you have with your health care provider.   Document Released: 01/14/2008 Document Revised: 08/08/2014 Document Reviewed: 12/13/2010 Elsevier Interactive Patient Education [employer].

## 2015-11-10 NOTE — Progress Notes (Signed)
Pre-visit discussion using our clinic review tool. No additional management support is needed unless otherwise documented below in the visit note.  

## 2015-11-11 LAB — CBC WITH DIFFERENTIAL/PLATELET
BASOS ABS: 0.1 10*3/uL (ref 0.0–0.1)
BASOS PCT: 0.5 % (ref 0.0–3.0)
Eosinophils Absolute: 0.1 10*3/uL (ref 0.0–0.7)
Eosinophils Relative: 1.3 % (ref 0.0–5.0)
HCT: 49.4 % (ref 39.0–52.0)
Hemoglobin: 16.9 g/dL (ref 13.0–17.0)
LYMPHS ABS: 1.9 10*3/uL (ref 0.7–4.0)
Lymphocytes Relative: 18.7 % (ref 12.0–46.0)
MCHC: 34.2 g/dL (ref 30.0–36.0)
MCV: 85.7 fl (ref 78.0–100.0)
MONOS PCT: 6.4 % (ref 3.0–12.0)
Monocytes Absolute: 0.7 10*3/uL (ref 0.1–1.0)
NEUTROS ABS: 7.6 10*3/uL (ref 1.4–7.7)
NEUTROS PCT: 73.1 % (ref 43.0–77.0)
PLATELETS: 279 10*3/uL (ref 150.0–400.0)
RBC: 5.77 Mil/uL (ref 4.22–5.81)
RDW: 13.2 % (ref 11.5–15.5)
WBC: 10.4 10*3/uL (ref 4.0–10.5)

## 2015-11-11 LAB — COMPREHENSIVE METABOLIC PANEL
ALT: 18 U/L (ref 0–53)
AST: 16 U/L (ref 0–37)
Albumin: 4.6 g/dL (ref 3.5–5.2)
Alkaline Phosphatase: 59 U/L (ref 39–117)
BILIRUBIN TOTAL: 0.6 mg/dL (ref 0.2–1.2)
BUN: 13 mg/dL (ref 6–23)
CO2: 29 mEq/L (ref 19–32)
Calcium: 9.9 mg/dL (ref 8.4–10.5)
Chloride: 101 mEq/L (ref 96–112)
Creatinine, Ser: 0.97 mg/dL (ref 0.40–1.50)
GFR: 86.27 mL/min (ref >60.00)
GLUCOSE: 54 mg/dL — AB (ref 70–99)
Potassium: 4 mEq/L (ref 3.5–5.1)
SODIUM: 142 meq/L (ref 135–145)
TOTAL PROTEIN: 7.2 g/dL (ref 6.0–8.3)

## 2015-11-11 LAB — LIPID PANEL
Cholesterol: 236 mg/dL — ABNORMAL HIGH (ref 0–200)
HDL: 44 mg/dL (ref >39.00)
NONHDL: 192.49
Total CHOL/HDL Ratio: 5
Triglycerides: 239 mg/dL — ABNORMAL HIGH (ref 0.0–149.0)
VLDL: 47.8 mg/dL — ABNORMAL HIGH (ref 0.0–40.0)

## 2015-11-11 LAB — LDL CHOLESTEROL, DIRECT: LDL DIRECT: 147 mg/dL

## 2015-11-11 LAB — HEPATITIS C ANTIBODY: HCV AB: NEGATIVE

## 2015-11-11 LAB — VITAMIN D 25 HYDROXY (VIT D DEFICIENCY, FRACTURES): VITD: 23.92 ng/mL — ABNORMAL LOW (ref 30.00–100.00)

## 2015-11-11 LAB — HIV ANTIBODY (ROUTINE TESTING W REFLEX): HIV: NONREACTIVE

## 2015-11-11 LAB — TSH: TSH: 1.91 u[IU]/mL (ref 0.35–4.50)

## 2015-11-11 LAB — PSA: PSA: 0.99 ng/mL (ref 0.10–4.00)

## 2015-11-13 ENCOUNTER — Other Ambulatory Visit: Payer: Self-pay | Admitting: Internal Medicine

## 2015-11-13 ENCOUNTER — Encounter: Payer: Self-pay | Admitting: Internal Medicine

## 2015-11-13 DIAGNOSIS — G4733 Obstructive sleep apnea (adult) (pediatric): Secondary | ICD-10-CM | POA: Insufficient documentation

## 2015-11-13 DIAGNOSIS — R0683 Snoring: Secondary | ICD-10-CM | POA: Insufficient documentation

## 2015-11-13 DIAGNOSIS — E559 Vitamin D deficiency, unspecified: Secondary | ICD-10-CM | POA: Insufficient documentation

## 2015-11-13 MED ORDER — ERGOCALCIFEROL 1.25 MG (50000 UT) PO CAPS: 50000.0000 [IU] | ORAL_CAPSULE | ORAL | Status: DC

## 2015-11-13 NOTE — Assessment & Plan Note (Signed)
Advised to consider sleep study given his obesity, snoring and elevated blood pressure.

## 2015-11-13 NOTE — Assessment & Plan Note (Signed)
Weight gain addressed. I have addressed  BMI and recommended wt loss of 10% of body weigh over the next 6 months using a low glycemic index diet and regular exercise a minimum of 5 days per week.   

## 2015-11-13 NOTE — Assessment & Plan Note (Signed)
He has not been checking his BP at home and BP remains elevated upon recheck. Possible secondary to untreated OSA given history of snoring. Patient advised to purchase a home BP device and check pressures once daily until diagnosis can be confirmed.   Lab Results  Component Value Date   CREATININE 0.97 11/10/2015   Lab Results  Component Value Date   NA 142 11/10/2015   K 4.0 11/10/2015   CL 101 11/10/2015   CO2 29 11/10/2015   No results found for: Derl Barrow

## 2015-11-13 NOTE — Assessment & Plan Note (Signed)
Based on your current lipid profile, the risk of clinically significant CAD is 16 % over the next 10 years, using the Framingham risk calculator. Statin therapy will be advised. .    Lab Results  Component Value Date   CHOL 236* 11/10/2015   HDL 44.00 11/10/2015   LDLDIRECT 147.0 11/10/2015   TRIG 239.0* 11/10/2015   CHOLHDL 5 11/10/2015

## 2015-11-13 NOTE — Assessment & Plan Note (Signed)

## 2016-01-18 ENCOUNTER — Encounter: Payer: Self-pay | Admitting: Internal Medicine

## 2016-01-18 ENCOUNTER — Telehealth: Payer: Self-pay | Admitting: *Deleted

## 2016-01-18 ENCOUNTER — Other Ambulatory Visit: Payer: Managed Care, Other (non HMO)

## 2016-01-18 ENCOUNTER — Ambulatory Visit (INDEPENDENT_AMBULATORY_CARE_PROVIDER_SITE_OTHER): Payer: Managed Care, Other (non HMO) | Admitting: Internal Medicine

## 2016-01-18 VITALS — BP 144/98 | HR 103 | Temp 98.5°F | Resp 14 | Ht 68.0 in | Wt 228.1 lb

## 2016-01-18 DIAGNOSIS — J209 Acute bronchitis, unspecified: Secondary | ICD-10-CM

## 2016-01-18 DIAGNOSIS — J208 Acute bronchitis due to other specified organisms: Secondary | ICD-10-CM

## 2016-01-18 MED ORDER — GUAIFENESIN-CODEINE 100-10 MG/5ML PO SYRP: 5.0000 mL | ORAL_SOLUTION | Freq: Three times a day (TID) | ORAL | Status: DC | PRN

## 2016-01-18 MED ORDER — PREDNISONE 10 MG PO TABS: ORAL_TABLET | ORAL | Status: DC

## 2016-01-18 MED ORDER — LEVOFLOXACIN 500 MG PO TABS: 500.0000 mg | ORAL_TABLET | Freq: Every day | ORAL | Status: DC

## 2016-01-18 MED ORDER — HYDROCOD POLST-CPM POLST ER 10-8 MG/5ML PO SUER: 5.0000 mL | Freq: Two times a day (BID) | ORAL | Status: DC | PRN

## 2016-01-18 NOTE — Telephone Encounter (Signed)
Spoke to patient and scheduled an appointment for 1830 today.  Patient has no questions, comment, or concerns.

## 2016-01-18 NOTE — Patient Instructions (Addendum)
I am treating you for pneumonia .  Please go get a chest x ray tomorrow either here or at [facility] and collect the sputum tonight before starting the antibiotic    I am prescribing an antibiotic (levaquin) and a prednisone taper  To manage the infection and the inflammation in your ear/sinuses.   Use the tussionex liquid cough syrup at night fr severe cough or headache.  It contains hydrocodone in it so DO NOT SHARE WITH OTHERS and do not expect to drive or work for 12 hours   The other cough medication has codeine in it ,  less strong , and less $$$$

## 2016-01-18 NOTE — Progress Notes (Signed)
Pre-visit discussion using our clinic review tool. No additional management support is needed unless otherwise documented below in the visit note.  

## 2016-01-18 NOTE — Progress Notes (Signed)
Subjective:  Patient ID: Tyler Peters, male    DOB: [birth year]  Age: 55 y.o. MRN: [id number]  CC: The primary encounter diagnosis was Acute bronchitis due to other specified organisms. A diagnosis of Acute bronchitis, unspecified organism was also pertinent to this visit.  HPI Tyler Peters presents for evaluation of productive cough that hasb een present for 5 weeks,  Started with the development of flu like symptoms including sore throat and body aches , sinus congestion , ear ache, subjective fevers and chills that occurred 2 days after flying to [place], [place]. He stayed in a hotel as well. .  Self treated with Nyquil, tylenol, motrin.  The body aches,  Earache and sinus drainage , and subjective fevers resolved,  But the productive cough has been persistent and worsens if he stops taking Nyquil.  He reports purulent sputum for the last 2-3 weeks and dyspnea which occurs after each coughing spell, but denies paroxysmal coughing suggestive of  whooping cough and has had a pertussis booster vaccine (2014) . He  denies worsening of cough with supine position. No orthopnea.   Outpatient Prescriptions Prior to Visit  Medication Sig Dispense Refill  . ergocalciferol (DRISDOL) 50000 units capsule Take 1 capsule (50,000 Units total) by mouth once a week. 4 capsule 0  . Multiple Vitamins-Minerals (MULTIVITAMIN WITH MINERALS) tablet Take 1 tablet by mouth daily.     No facility-administered medications prior to visit.    Review of Systems;  Patient denies headache, fevers, malaise, unintentional weight loss, skin rash, eye pain, sinus congestion and sinus pain, sore throat, dysphagia,  hemoptysis ,chest pain, palpitations, orthopnea, edema, abdominal pain, nausea, melena, diarrhea, constipation, flank pain, dysuria, hematuria, urinary  Frequency, nocturia, numbness, tingling, seizures,  Focal weakness, Loss of consciousness,  Tremor, insomnia, depression, anxiety, and suicidal ideation.       Objective:  BP 144/98 mmHg  Pulse 103  Temp(Src) 98.5 F (36.9 C) (Oral)  Resp 14  Ht 5\' 8"  (1.727 m)  Wt 228 lb 2 oz (103.477 kg)  BMI 34.69 kg/m2  SpO2 93%  BP Readings from Last 3 Encounters:  01/18/16 144/98  11/10/15 148/100  11/10/14 128/70    Wt Readings from Last 3 Encounters:  01/18/16 228 lb 2 oz (103.477 kg)  11/10/15 225 lb 8 oz (102.286 kg)  11/10/14 217 lb 8 oz (98.657 kg)    General appearance: alert, cooperative and appears stated age Ears: normal TM's and external ear canals both ears Face: no frontal or maxillary sinus tenderness  Throat: lips, mucosa, and tongue normal; no tonsillar exudate Neck: no adenopathy, no carotid bruit, supple, symmetrical, trachea midline and thyroid not enlarged, symmetric, no tenderness/mass/nodules Back: symmetric, no curvature. ROM normal. No CVA tenderness. Lungs: bilateral ronchi in all fields,  No egophony  Heart: regular rate and rhythm, S1, S2 normal, no murmur, click, rub or gallop Abdomen: soft, non-tender; bowel sounds normal; no masses,  no organomegaly Pulses: 2+ and symmetric Skin: Skin color, texture, turgor normal. No rashes or lesions Lymph nodes: Cervical, supraclavicular, and axillary nodes normal.  No results found for: HGBA1C  Lab Results  Component Value Date   CREATININE 0.97 11/10/2015   CREATININE 1.1 06/03/2013   CREATININE 0.96 01/17/2012    Lab Results  Component Value Date   WBC 10.4 11/10/2015   HGB 16.9 11/10/2015   HCT 49.4 11/10/2015   PLT 279.0 11/10/2015   GLUCOSE 54* 11/10/2015   CHOL 236* 11/10/2015   TRIG 239.0* 11/10/2015  HDL 44.00 11/10/2015   LDLDIRECT 147.0 11/10/2015   ALT 18 11/10/2015   AST 16 11/10/2015   NA 142 11/10/2015   K 4.0 11/10/2015   CL 101 11/10/2015   CREATININE 0.97 11/10/2015   BUN 13 11/10/2015   CO2 29 11/10/2015   TSH 1.91 11/10/2015   PSA 0.99 11/10/2015    Assessment & Plan:   Problem List Items Addressed This Visit    Acute  bronchitis - Primary    Sputum culture, urinary legionella antigen collected,  Chest x ray and CBC ordered.  Empiric treatment with levaquin and prednisone taper pending results. Probiotic advised.       Relevant Orders   Legionella Antigen, Urine   Respiratory or Resp and Sputum Culture   CBC with Differential/Platelet   DG Chest 2 View      I am having Mr. Fichter start on levofloxacin, predniSONE, chlorpheniramine-HYDROcodone, and guaiFENesin-codeine. I am also having him maintain his multivitamin with minerals, ergocalciferol, Phenylephrine-DM-GG-APAP, and Phenyleph-Doxylamine-DM-APAP.  Meds ordered this encounter  Medications  . Phenylephrine-DM-GG-APAP (VICKS DAYQUIL SEVERE COLD/FLU) 5-10-200-325 MG TABS    Sig: Take 2 capsules by mouth 2 (two) times daily.  Marland Kitchen Phenyleph-Doxylamine-DM-APAP (NYQUIL SEVERE COLD/FLU) 5-6.25-10-325 MG/15ML LIQD    Sig: Take 1 oz by mouth at bedtime as needed.  Marland Kitchen levofloxacin (LEVAQUIN) 500 MG tablet    Sig: Take 1 tablet (500 mg total) by mouth daily.    Dispense:  7 tablet    Refill:  0  . predniSONE (DELTASONE) 10 MG tablet    Sig: 6 tablets on Day 1 , then reduce by 1 tablet daily until gone    Dispense:  21 tablet    Refill:  0  . chlorpheniramine-HYDROcodone (TUSSIONEX PENNKINETIC ER) 10-8 MG/5ML SUER    Sig: Take 5 mLs by mouth every 12 (twelve) hours as needed for cough.    Dispense:  140 mL    Refill:  0  . guaiFENesin-codeine (CHERATUSSIN AC) 100-10 MG/5ML syrup    Sig: Take 5 mLs by mouth 3 (three) times daily as needed for cough.    Dispense:  120 mL    Refill:  0    There are no discontinued medications.  Follow-up: Return in about 2 weeks (around 02/01/2016).   Crecencio Mc, MD

## 2016-01-18 NOTE — Telephone Encounter (Signed)
Patient has been sick for five weeks, with chest congestion and cough OTC medications are no longer working.  He only wants to see Dr.Tullo. Please give a time and date to schedule patient.  Pt contact [phone]

## 2016-01-19 ENCOUNTER — Other Ambulatory Visit: Payer: Managed Care, Other (non HMO)

## 2016-01-19 ENCOUNTER — Encounter: Payer: Self-pay | Admitting: Internal Medicine

## 2016-01-19 ENCOUNTER — Telehealth: Payer: Self-pay | Admitting: *Deleted

## 2016-01-19 ENCOUNTER — Ambulatory Visit: Payer: Self-pay

## 2016-01-19 ENCOUNTER — Ambulatory Visit (INDEPENDENT_AMBULATORY_CARE_PROVIDER_SITE_OTHER): Payer: Managed Care, Other (non HMO)

## 2016-01-19 DIAGNOSIS — J209 Acute bronchitis, unspecified: Secondary | ICD-10-CM

## 2016-01-19 LAB — CBC WITH DIFFERENTIAL/PLATELET
BASOS ABS: 0.1 10*3/uL (ref 0.0–0.1)
Basophils Relative: 0.6 % (ref 0.0–3.0)
EOS ABS: 0.2 10*3/uL (ref 0.0–0.7)
Eosinophils Relative: 2 % (ref 0.0–5.0)
HCT: 46.7 % (ref 39.0–52.0)
Hemoglobin: 15.8 g/dL (ref 13.0–17.0)
LYMPHS ABS: 2 10*3/uL (ref 0.7–4.0)
Lymphocytes Relative: 23.1 % (ref 12.0–46.0)
MCHC: 33.7 g/dL (ref 30.0–36.0)
MCV: 85.9 fl (ref 78.0–100.0)
MONO ABS: 0.6 10*3/uL (ref 0.1–1.0)
Monocytes Relative: 7.2 % (ref 3.0–12.0)
NEUTROS PCT: 67.1 % (ref 43.0–77.0)
Neutro Abs: 5.9 10*3/uL (ref 1.4–7.7)
Platelets: 290 10*3/uL (ref 150.0–400.0)
RBC: 5.44 Mil/uL (ref 4.22–5.81)
RDW: 13.8 % (ref 11.5–15.5)
WBC: 8.8 10*3/uL (ref 4.0–10.5)

## 2016-01-19 NOTE — Assessment & Plan Note (Signed)
Sputum culture, urinary legionella antigen collected,  Chest x ray and CBC ordered.  Empiric treatment with levaquin and prednisone taper pending results. Probiotic advised.

## 2016-01-19 NOTE — Telephone Encounter (Signed)
Patient requested a call in reference to home health care. The name of the place stated was   [facility] home health care [phone]

## 2016-01-19 NOTE — Telephone Encounter (Signed)
Do you know anything about this? Please advise.

## 2016-01-20 ENCOUNTER — Encounter: Payer: Self-pay | Admitting: Internal Medicine

## 2016-01-20 LAB — LEGIONELLA ANTIGEN, URINE
RESULTS - LGAGUR: DETECTED
RESULTS - LGAGUR: NEGATIVE
Result - LGAGUR: NOT DETECTED

## 2016-01-22 ENCOUNTER — Encounter: Payer: Self-pay | Admitting: Internal Medicine

## 2016-01-22 LAB — RESPIRATORY CULTURE OR RESPIRATORY AND SPUTUM CULTURE

## 2016-02-12 ENCOUNTER — Other Ambulatory Visit: Payer: Managed Care, Other (non HMO)

## 2016-02-12 ENCOUNTER — Ambulatory Visit (INDEPENDENT_AMBULATORY_CARE_PROVIDER_SITE_OTHER): Payer: Managed Care, Other (non HMO)

## 2016-02-12 ENCOUNTER — Encounter: Payer: Self-pay | Admitting: Internal Medicine

## 2016-02-12 ENCOUNTER — Other Ambulatory Visit: Payer: Self-pay | Admitting: Internal Medicine

## 2016-02-12 DIAGNOSIS — J189 Pneumonia, unspecified organism: Secondary | ICD-10-CM

## 2016-02-12 MED ORDER — FLUTICASONE PROPIONATE HFA 220 MCG/ACT IN AERO: 2.0000 | INHALATION_SPRAY | Freq: Two times a day (BID) | RESPIRATORY_TRACT | Status: DC

## 2016-02-12 MED ORDER — PREDNISONE 10 MG PO TABS: ORAL_TABLET | ORAL | Status: DC

## 2016-02-12 NOTE — Telephone Encounter (Signed)
Patient feels like symptoms are returning from 01/18/16, starting cough up light green to yellow mucus again which had stopped for 2.5 weeks. I scheduled patient for Monday at 3 FYI

## 2016-02-12 NOTE — Progress Notes (Signed)
Added to lab appointment.

## 2016-02-15 ENCOUNTER — Ambulatory Visit (INDEPENDENT_AMBULATORY_CARE_PROVIDER_SITE_OTHER): Payer: Managed Care, Other (non HMO) | Admitting: Internal Medicine

## 2016-02-15 ENCOUNTER — Encounter: Payer: Self-pay | Admitting: Internal Medicine

## 2016-02-15 VITALS — BP 128/88 | HR 99 | Temp 98.9°F | Resp 12 | Ht 68.0 in | Wt 224.0 lb

## 2016-02-15 DIAGNOSIS — J209 Acute bronchitis, unspecified: Secondary | ICD-10-CM

## 2016-02-15 MED ORDER — BENZONATATE 200 MG PO CAPS: 200.0000 mg | ORAL_CAPSULE | Freq: Three times a day (TID) | ORAL | Status: DC | PRN

## 2016-02-15 MED ORDER — BECLOMETHASONE DIPROPIONATE 40 MCG/ACT IN AERS: 2.0000 | INHALATION_SPRAY | Freq: Two times a day (BID) | RESPIRATORY_TRACT | Status: DC

## 2016-02-15 MED ORDER — DOXYCYCLINE HYCLATE 100 MG PO CAPS: 100.0000 mg | ORAL_CAPSULE | Freq: Two times a day (BID) | ORAL | Status: DC

## 2016-02-15 NOTE — Patient Instructions (Signed)
I am treating you for bronchitis:  Qvar is a steroid inhaler ,  Use it twice daily  Add sudafed or Sudafed PE  for congestion   Benzonatate capsules for cough suppression   Ok to use Afrin at bedtime for 5 days max   Finish  THE PREDNISONE TAPER    You can add Benadryl at night,  Or a non sedating antihistamine (Allegra, Claritin or Zyrtec)  if you are clearing your throat a lot from post nasal drip (which can prolong a cough)   Acute Bronchitis Bronchitis is inflammation of the airways that extend from the windpipe into the lungs (bronchi). The inflammation often causes mucus to develop. This leads to a cough, which is the most common symptom of bronchitis.  In acute bronchitis, the condition usually develops suddenly and goes away over time, usually in a couple weeks. Smoking, allergies, and asthma can make bronchitis worse. Repeated episodes of bronchitis may cause further lung problems.  CAUSES Acute bronchitis is most often caused by the same virus that causes a cold. The virus can spread from person to person (contagious) through coughing, sneezing, and touching contaminated objects. SIGNS AND SYMPTOMS   Cough.   Fever.   Coughing up mucus.   Body aches.   Chest congestion.   Chills.   Shortness of breath.   Sore throat.  DIAGNOSIS  Acute bronchitis is usually diagnosed through a physical exam. Your health care provider will also ask you questions about your medical history. Tests, such as chest X-rays, are sometimes done to rule out other conditions.  TREATMENT  Acute bronchitis usually goes away in a couple weeks. Oftentimes, no medical treatment is necessary. Medicines are sometimes given for relief of fever or cough. Antibiotic medicines are usually not needed but may be prescribed in certain situations. In some cases, an inhaler may be recommended to help reduce shortness of breath and control the cough. A cool mist vaporizer may also be used to help thin  bronchial secretions and make it easier to clear the chest.  HOME CARE INSTRUCTIONS  Get plenty of rest.   Drink enough fluids to keep your urine clear or pale yellow (unless you have a medical condition that requires fluid restriction). Increasing fluids may help thin your respiratory secretions (sputum) and reduce chest congestion, and it will prevent dehydration.   Take medicines only as directed by your health care provider.  If you were prescribed an antibiotic medicine, finish it all even if you start to feel better.  Avoid smoking and secondhand smoke. Exposure to cigarette smoke or irritating chemicals will make bronchitis worse. If you are a smoker, consider using nicotine gum or skin patches to help control withdrawal symptoms. Quitting smoking will help your lungs heal faster.   Reduce the chances of another bout of acute bronchitis by washing your hands frequently, avoiding people with cold symptoms, and trying not to touch your hands to your mouth, nose, or eyes.   Keep all follow-up visits as directed by your health care provider.  SEEK MEDICAL CARE IF: Your symptoms do not improve after 1 week of treatment.  SEEK IMMEDIATE MEDICAL CARE IF:  You develop an increased fever or chills.   You have chest pain.   You have severe shortness of breath.  You have bloody sputum.   You develop dehydration.  You faint or repeatedly feel like you are going to pass out.  You develop repeated vomiting.  You develop a severe headache. MAKE SURE YOU:  Understand these instructions.  Will watch your condition.  Will get help right away if you are not doing well or get worse.   This information is not intended to replace advice given to you by your health care provider. Make sure you discuss any questions you have with your health care provider.   Document Released: 08/25/2004 Document Revised: 08/08/2014 Document Reviewed: 01/08/2013 Elsevier Interactive Patient  Education [employer].

## 2016-02-15 NOTE — Progress Notes (Signed)
Subjective:  Patient ID: Tyler Peters, male    DOB: 1961-01-30  Age: 55 y.o. MRN: [id number]  CC: The encounter diagnosis was Acute bronchitis, unspecified organism.  HPI Tyler Peters presents for recurrence of cough following treatment for pneumonia 2 weeks ago.  Chest x ray was repeated last Friday showing resolution of infiltrate,  But peribronchial thickening.   He was prescribed a prednisone taper, steroid inhaler,  But insurance would not cover not cover Flovent,  Only Qvar.  He has been taking prednisone since Saturday.    Outpatient Prescriptions Prior to Visit  Medication Sig Dispense Refill  . fluticasone (FLOVENT HFA) 220 MCG/ACT inhaler Inhale 2 puffs into the lungs 2 (two) times daily. 1 Inhaler 12  . Multiple Vitamins-Minerals (MULTIVITAMIN WITH MINERALS) tablet Take 1 tablet by mouth daily.    . predniSONE (DELTASONE) 10 MG tablet 6 tablets on Day 1 , then reduce by 1 tablet daily until gone 21 tablet 0  . predniSONE (DELTASONE) 10 MG tablet 6 tablets on Day 1 , then reduce by 1 tablet daily until gone 21 tablet 0  . ergocalciferol (DRISDOL) 50000 units capsule Take 1 capsule (50,000 Units total) by mouth once a week. 4 capsule 0  . levofloxacin (LEVAQUIN) 500 MG tablet Take 1 tablet (500 mg total) by mouth daily. (Patient not taking: Reported on 02/15/2016) 7 tablet 0  . Phenyleph-Doxylamine-DM-APAP (NYQUIL SEVERE COLD/FLU) 5-6.25-10-325 MG/15ML LIQD Take 1 oz by mouth at bedtime as needed. Reported on 02/15/2016    . Phenylephrine-DM-GG-APAP (VICKS DAYQUIL SEVERE COLD/FLU) 5-10-200-325 MG TABS Take 2 capsules by mouth 2 (two) times daily. Reported on 02/15/2016    . chlorpheniramine-HYDROcodone (TUSSIONEX PENNKINETIC ER) 10-8 MG/5ML SUER Take 5 mLs by mouth every 12 (twelve) hours as needed for cough. (Patient not taking: Reported on 02/15/2016) 140 mL 0  . guaiFENesin-codeine (CHERATUSSIN AC) 100-10 MG/5ML syrup Take 5 mLs by mouth 3 (three) times daily as needed for cough.  (Patient not taking: Reported on 02/15/2016) 120 mL 0   No facility-administered medications prior to visit.    Review of Systems;  Patient denies headache, fevers, malaise, unintentional weight loss, skin rash, eye pain, sinus congestion and sinus pain, sore throat, dysphagia,  hemoptysis , cough, dyspnea, wheezing, chest pain, palpitations, orthopnea, edema, abdominal pain, nausea, melena, diarrhea, constipation, flank pain, dysuria, hematuria, urinary  Frequency, nocturia, numbness, tingling, seizures,  Focal weakness, Loss of consciousness,  Tremor, insomnia, depression, anxiety, and suicidal ideation.      Objective:  BP 128/88 mmHg  Pulse 99  Temp(Src) 98.9 F (37.2 C) (Oral)  Resp 12  Ht 5\' 8"  (1.727 m)  Wt 224 lb (101.606 kg)  BMI 34.07 kg/m2  SpO2 99%  BP Readings from Last 3 Encounters:  02/15/16 128/88  01/18/16 144/98  11/10/15 148/100    Wt Readings from Last 3 Encounters:  02/15/16 224 lb (101.606 kg)  01/18/16 228 lb 2 oz (103.477 kg)  11/10/15 225 lb 8 oz (102.286 kg)    General appearance: alert, cooperative and appears stated age Ears: normal TM's and external ear canals both ears Throat: lips, mucosa, and tongue normal; teeth and gums normal Neck: no adenopathy, no carotid bruit, supple, symmetrical, trachea midline and thyroid not enlarged, symmetric, no tenderness/mass/nodules Back: symmetric, no curvature. ROM normal. No CVA tenderness. Lungs: clear to auscultation bilaterally Heart: regular rate and rhythm, S1, S2 normal, no murmur, click, rub or gallop Abdomen: soft, non-tender; bowel sounds normal; no masses,  no organomegaly Pulses: 2+  and symmetric Skin: Skin color, texture, turgor normal. No rashes or lesions Lymph nodes: Cervical, supraclavicular, and axillary nodes normal.  No results found for: HGBA1C  Lab Results  Component Value Date   CREATININE 0.97 11/10/2015   CREATININE 1.1 06/03/2013   CREATININE 0.96 01/17/2012    Lab  Results  Component Value Date   WBC 8.8 01/18/2016   HGB 15.8 01/18/2016   HCT 46.7 01/18/2016   PLT 290.0 01/18/2016   GLUCOSE 54* 11/10/2015   CHOL 236* 11/10/2015   TRIG 239.0* 11/10/2015   HDL 44.00 11/10/2015   LDLDIRECT 147.0 11/10/2015   ALT 18 11/10/2015   AST 16 11/10/2015   NA 142 11/10/2015   K 4.0 11/10/2015   CL 101 11/10/2015   CREATININE 0.97 11/10/2015   BUN 13 11/10/2015   CO2 29 11/10/2015   TSH 1.91 11/10/2015   PSA 0.99 11/10/2015   .     Assessment & Plan:   Problem List Items Addressed This Visit    Acute bronchitis - Primary    Prednisone taper, steroid inhaler and cough suppressant.          I have discontinued Mr. Friedly ergocalciferol, chlorpheniramine-HYDROcodone, and guaiFENesin-codeine. I am also having him start on beclomethasone, benzonatate, and doxycycline. Additionally, I am having him maintain his multivitamin with minerals, Phenylephrine-DM-GG-APAP, Phenyleph-Doxylamine-DM-APAP, levofloxacin, predniSONE, predniSONE, fluticasone, and Vitamin D3.  Meds ordered this encounter  Medications  . Cholecalciferol (VITAMIN D3) 1000 units CAPS    Sig: Take 1 capsule by mouth daily.  . beclomethasone (QVAR) 40 MCG/ACT inhaler    Sig: Inhale 2 puffs into the lungs 2 (two) times daily.    Dispense:  1 Inhaler    Refill:  0  . benzonatate (TESSALON) 200 MG capsule    Sig: Take 1 capsule (200 mg total) by mouth 3 (three) times daily as needed for cough.    Dispense:  60 capsule    Refill:  1  . doxycycline (VIBRAMYCIN) 100 MG capsule    Sig: Take 1 capsule (100 mg total) by mouth 2 (two) times daily. For tick bite    Dispense:  20 capsule    Refill:  0    Medications Discontinued During This Encounter  Medication Reason  . ergocalciferol (DRISDOL) 50000 units capsule Completed Course  . guaiFENesin-codeine (CHERATUSSIN AC) 100-10 MG/5ML syrup   . chlorpheniramine-HYDROcodone (TUSSIONEX PENNKINETIC ER) 10-8 MG/5ML SUER     Follow-up:  No Follow-up on file.   Crecencio Mc, MD

## 2016-02-15 NOTE — Progress Notes (Signed)
Pre-visit discussion using our clinic review tool. No additional management support is needed unless otherwise documented below in the visit note.  

## 2016-02-17 NOTE — Assessment & Plan Note (Signed)
Prednisone taper, steroid inhaler and cough suppressant.

## 2016-11-15 ENCOUNTER — Ambulatory Visit (INDEPENDENT_AMBULATORY_CARE_PROVIDER_SITE_OTHER): Payer: Managed Care, Other (non HMO)

## 2016-11-15 ENCOUNTER — Ambulatory Visit (INDEPENDENT_AMBULATORY_CARE_PROVIDER_SITE_OTHER): Payer: Managed Care, Other (non HMO) | Admitting: Family

## 2016-11-15 ENCOUNTER — Encounter: Payer: Self-pay | Admitting: Family

## 2016-11-15 VITALS — BP 140/86 | HR 95 | Temp 98.2°F | Ht 68.0 in | Wt 231.0 lb

## 2016-11-15 DIAGNOSIS — J209 Acute bronchitis, unspecified: Secondary | ICD-10-CM | POA: Diagnosis not present

## 2016-11-15 DIAGNOSIS — K219 Gastro-esophageal reflux disease without esophagitis: Secondary | ICD-10-CM | POA: Insufficient documentation

## 2016-11-15 MED ORDER — ALBUTEROL SULFATE HFA 108 (90 BASE) MCG/ACT IN AERS
2.0000 | INHALATION_SPRAY | Freq: Four times a day (QID) | RESPIRATORY_TRACT | 0 refills | Status: DC | PRN
Start: 2016-11-15 — End: 2019-11-28

## 2016-11-15 NOTE — Assessment & Plan Note (Signed)
Discussed possible GERD contributing to cough. Education provided on long term use of ppis.

## 2016-11-15 NOTE — Progress Notes (Signed)
Subjective:    Patient ID: Tyler Peters, male    DOB: 1960/08/10, 56 y.o.   MRN: [id number]  CC: Tyler Peters is a 56 y.o. male who presents today for an acute visit.    HPI: CC: thin, clear sinus congestion x one, unchanged.  Endorses cough, wheezing.  No  SOB, fever.   Tried sudafed, allegra-D, tyelonol cold and congestion, tessalon, robitussin without relief. Did try zyrtec for couple days as well no relief.   Nonsmoker  GERD- takes pepcid. No epigastric burning          HISTORY:  Past Medical History:  Diagnosis Date  . Episodic recurrent vertigo    evaluated by Dr. Tami Ribas with normal MRI  . Episodic recurrent vertigo    normal MRI   . History of acute prostatitis Fall 2008  . Hyperlipidemia, mixed   . Testicular pain 2008   prostatis, treated with Flomax by Dr. Jacqlyn Larsen   Past Surgical History:  Procedure Laterality Date  . NASAL SEPTUM SURGERY  2004   Family History  Problem Relation Age of Onset  . Heart disease Father   . Heart disease Maternal Grandmother   . Diabetes Maternal Grandmother     Allergies: Patient has no known allergies. Current Outpatient Prescriptions on File Prior to Visit  Medication Sig Dispense Refill  . beclomethasone (QVAR) 40 MCG/ACT inhaler Inhale 2 puffs into the lungs 2 (two) times daily. 1 Inhaler 0  . benzonatate (TESSALON) 200 MG capsule Take 1 capsule (200 mg total) by mouth 3 (three) times daily as needed for cough. 60 capsule 1  . Cholecalciferol (VITAMIN D3) 1000 units CAPS Take 1 capsule by mouth daily.    . Multiple Vitamins-Minerals (MULTIVITAMIN WITH MINERALS) tablet Take 1 tablet by mouth daily.    Marland Kitchen Phenyleph-Doxylamine-DM-APAP (NYQUIL SEVERE COLD/FLU) 5-6.25-10-325 MG/15ML LIQD Take 1 oz by mouth at bedtime as needed. Reported on 02/15/2016     No current facility-administered medications on file prior to visit.     Social History  Substance Use Topics  . Smoking status: Never Smoker  . Smokeless  tobacco: Never Used  . Alcohol use No    Review of Systems  Constitutional: Negative for chills and fever.  HENT: Positive for congestion, rhinorrhea and sinus pressure. Negative for ear pain, sinus pain and sore throat.   Respiratory: Positive for cough and wheezing. Negative for shortness of breath.   Cardiovascular: Negative for chest pain and palpitations.  Gastrointestinal: Negative for nausea and vomiting.      Objective:    BP 140/86   Pulse 95   Temp 98.2 F (36.8 C) (Oral)   Ht 5\' 8"  (1.727 m)   Wt 231 lb (104.8 kg)   SpO2 97%   BMI 35.12 kg/m    Physical Exam  Constitutional: Vital signs are normal. He appears well-developed and well-nourished.  HENT:  Head: Normocephalic and atraumatic.  Right Ear: Hearing, tympanic membrane, external ear and ear canal normal. No drainage, swelling or tenderness. Tympanic membrane is not injected, not erythematous and not bulging. No middle ear effusion. No decreased hearing is noted.  Left Ear: Hearing, tympanic membrane, external ear and ear canal normal. No drainage, swelling or tenderness. Tympanic membrane is not injected, not erythematous and not bulging.  No middle ear effusion. No decreased hearing is noted.  Nose: Nose normal. Right sinus exhibits no maxillary sinus tenderness and no frontal sinus tenderness. Left sinus exhibits no maxillary sinus tenderness and no frontal sinus tenderness.  Mouth/Throat: Uvula is midline, oropharynx is clear and moist and mucous membranes are normal. No oropharyngeal exudate, posterior oropharyngeal edema, posterior oropharyngeal erythema or tonsillar abscesses.  Eyes: Conjunctivae are normal.  Cardiovascular: Regular rhythm and normal heart sounds.   Pulmonary/Chest: Effort normal. No respiratory distress. He has wheezes in the right lower field and the left lower field. He has no rhonchi. He has no rales.  Lymphadenopathy:       Head (right side): No submental, no submandibular, no  tonsillar, no preauricular, no posterior auricular and no occipital adenopathy present.       Head (left side): No submental, no submandibular, no tonsillar, no preauricular, no posterior auricular and no occipital adenopathy present.    He has no cervical adenopathy.  Neurological: He is alert.  Skin: Skin is warm and dry.  Psychiatric: He has a normal mood and affect. His speech is normal and behavior is normal.  Vitals reviewed. Patient felt significantly better after albuterol treatment. Lung sounds clear and increased      Assessment & Plan:   Problem List Items Addressed This Visit      Respiratory   Acute bronchitis - Primary    sao2 97. No acute respiratory distress. Pleased to see improvement on nebulizer.  Advised qvar and albuterol inhaler. OTC couugh suppressant at home. Pending CXR to ensure no PNA.       Relevant Medications   albuterol (PROVENTIL HFA;VENTOLIN HFA) 108 (90 Base) MCG/ACT inhaler   Other Relevant Orders   DG Chest 2 View     Digestive   GERD (gastroesophageal reflux disease)    Discussed possible GERD contributing to cough. Education provided on long term use of ppis.           I have discontinued Tyler Peters Phenylephrine-DM-GG-APAP, levofloxacin, predniSONE, predniSONE, fluticasone, and doxycycline. I am also having him start on albuterol. Additionally, I am having him maintain his multivitamin with minerals, Phenyleph-Doxylamine-DM-APAP, Vitamin D3, beclomethasone, and benzonatate.   Meds ordered this encounter  Medications  . albuterol (PROVENTIL HFA;VENTOLIN HFA) 108 (90 Base) MCG/ACT inhaler    Sig: Inhale 2 puffs into the lungs every 6 (six) hours as needed for wheezing or shortness of breath.    Dispense:  1 Inhaler    Refill:  0    Order Specific Question:   Supervising Provider    Answer:   Crecencio Mc [[id number]]    Return precautions given.   Risks, benefits, and alternatives of the medications and treatment plan prescribed  today were discussed, and patient expressed understanding.   Education regarding symptom management and diagnosis given to patient on AVS.  Continue to follow with TULLO, Aris Everts, MD for routine health maintenance.   Daiva Nakayama and I agreed with plan.   Mable Paris, FNP

## 2016-11-15 NOTE — Progress Notes (Signed)
Pre visit review using our clinic review tool, if applicable. No additional management support is needed unless otherwise documented below in the visit note. 

## 2016-11-15 NOTE — Assessment & Plan Note (Addendum)
sao2 97. No acute respiratory distress. Pleased to see improvement on nebulizer.  Advised qvar and albuterol inhaler. OTC couugh suppressant at home. Pending CXR to ensure no PNA.

## 2016-11-15 NOTE — Patient Instructions (Addendum)
Long term use beyond 3 months of proton pump inhibitors , also called PPI's, is associated with malabsorption of vitamins, chronic kidney disease, fracture risk, and diarrheal illnesses. PPI's include Nexium, Prilosec, Protonix, Dexilant, and Prevacid.   I generally recommend trying to control acid reflux with lifestyle modifications including avoiding trigger foods, not eating 2 hours prior to bedtime. You may use histamine 2 blockers daily to twice daily ( this is Zantac, Pepcid) and then when symptoms flare, start back on PPI for short course.    Trial of Qvar consistently   Use albuterol every 6 hours for first 24 hours to get good medication into the lungs and loosen congestion; after, you may use as needed and eventually stop all together when cough resolves.   Chest Xray  Let me know if not better

## 2017-01-12 ENCOUNTER — Encounter: Payer: Self-pay | Admitting: Internal Medicine

## 2017-01-12 ENCOUNTER — Other Ambulatory Visit: Payer: Self-pay | Admitting: Internal Medicine

## 2017-01-12 MED ORDER — PREDNISONE 10 MG PO TABS: ORAL_TABLET | ORAL | 0 refills | Status: DC

## 2017-01-12 MED ORDER — TRIAMCINOLONE ACETONIDE 0.1 % EX CREA: 1.0000 "application " | TOPICAL_CREAM | Freq: Two times a day (BID) | CUTANEOUS | 0 refills | Status: DC

## 2017-01-19 ENCOUNTER — Other Ambulatory Visit: Payer: Self-pay | Admitting: Internal Medicine

## 2017-01-19 MED ORDER — PREDNISONE 10 MG PO TABS: ORAL_TABLET | ORAL | 0 refills | Status: DC

## 2017-05-04 ENCOUNTER — Telehealth: Payer: Self-pay | Admitting: Internal Medicine

## 2017-05-04 NOTE — Telephone Encounter (Signed)
Will you please schedule pt for 05/18/2017 at 11:30am per Dr. Derrel Nip. Pt is aware of appt date and time.

## 2017-05-04 NOTE — Telephone Encounter (Signed)
Would it be okay to schedule him in one of your Monday evening slots or an 11:30 or 4:30 slot?

## 2017-05-04 NOTE — Telephone Encounter (Signed)
You can use an 11:30 slot

## 2017-05-04 NOTE — Telephone Encounter (Signed)
Pt called about needing to have a CPE for his job before the end of October. I advised pt no appt avail to sch. Is there anywhere we can schedule pt to get a CPE? Please advise? Pt preference is any day and any time.   Call pt @ [phone]. Thank you!

## 2017-05-05 NOTE — Telephone Encounter (Signed)
Scheduled

## 2017-05-09 IMAGING — DX DG CHEST 2V
2 series · 2 of 2 positions shown · non-contrast
Comparison: None.

CLINICAL DATA: Five week history of productive cough and wheezing.

EXAM:
CHEST  2 VIEW

[chest pa]
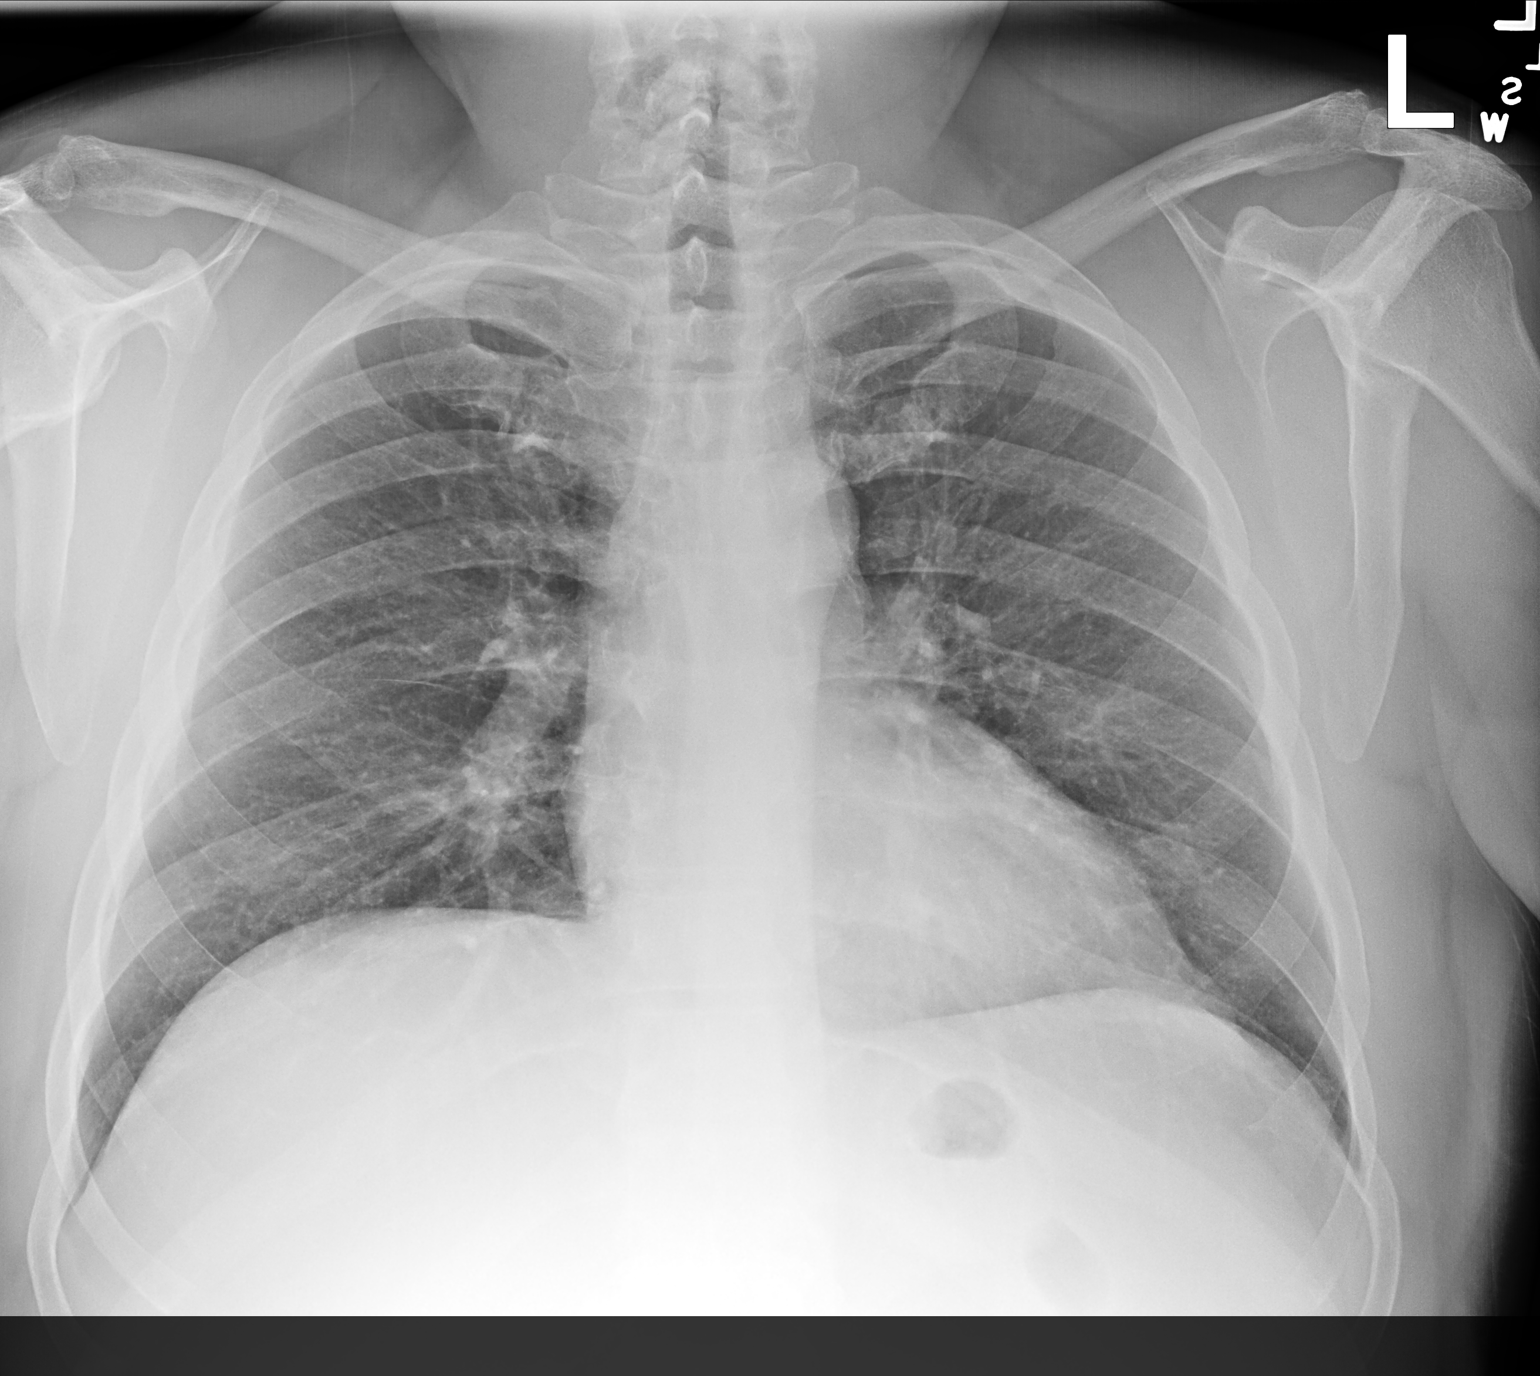

[chest lat]
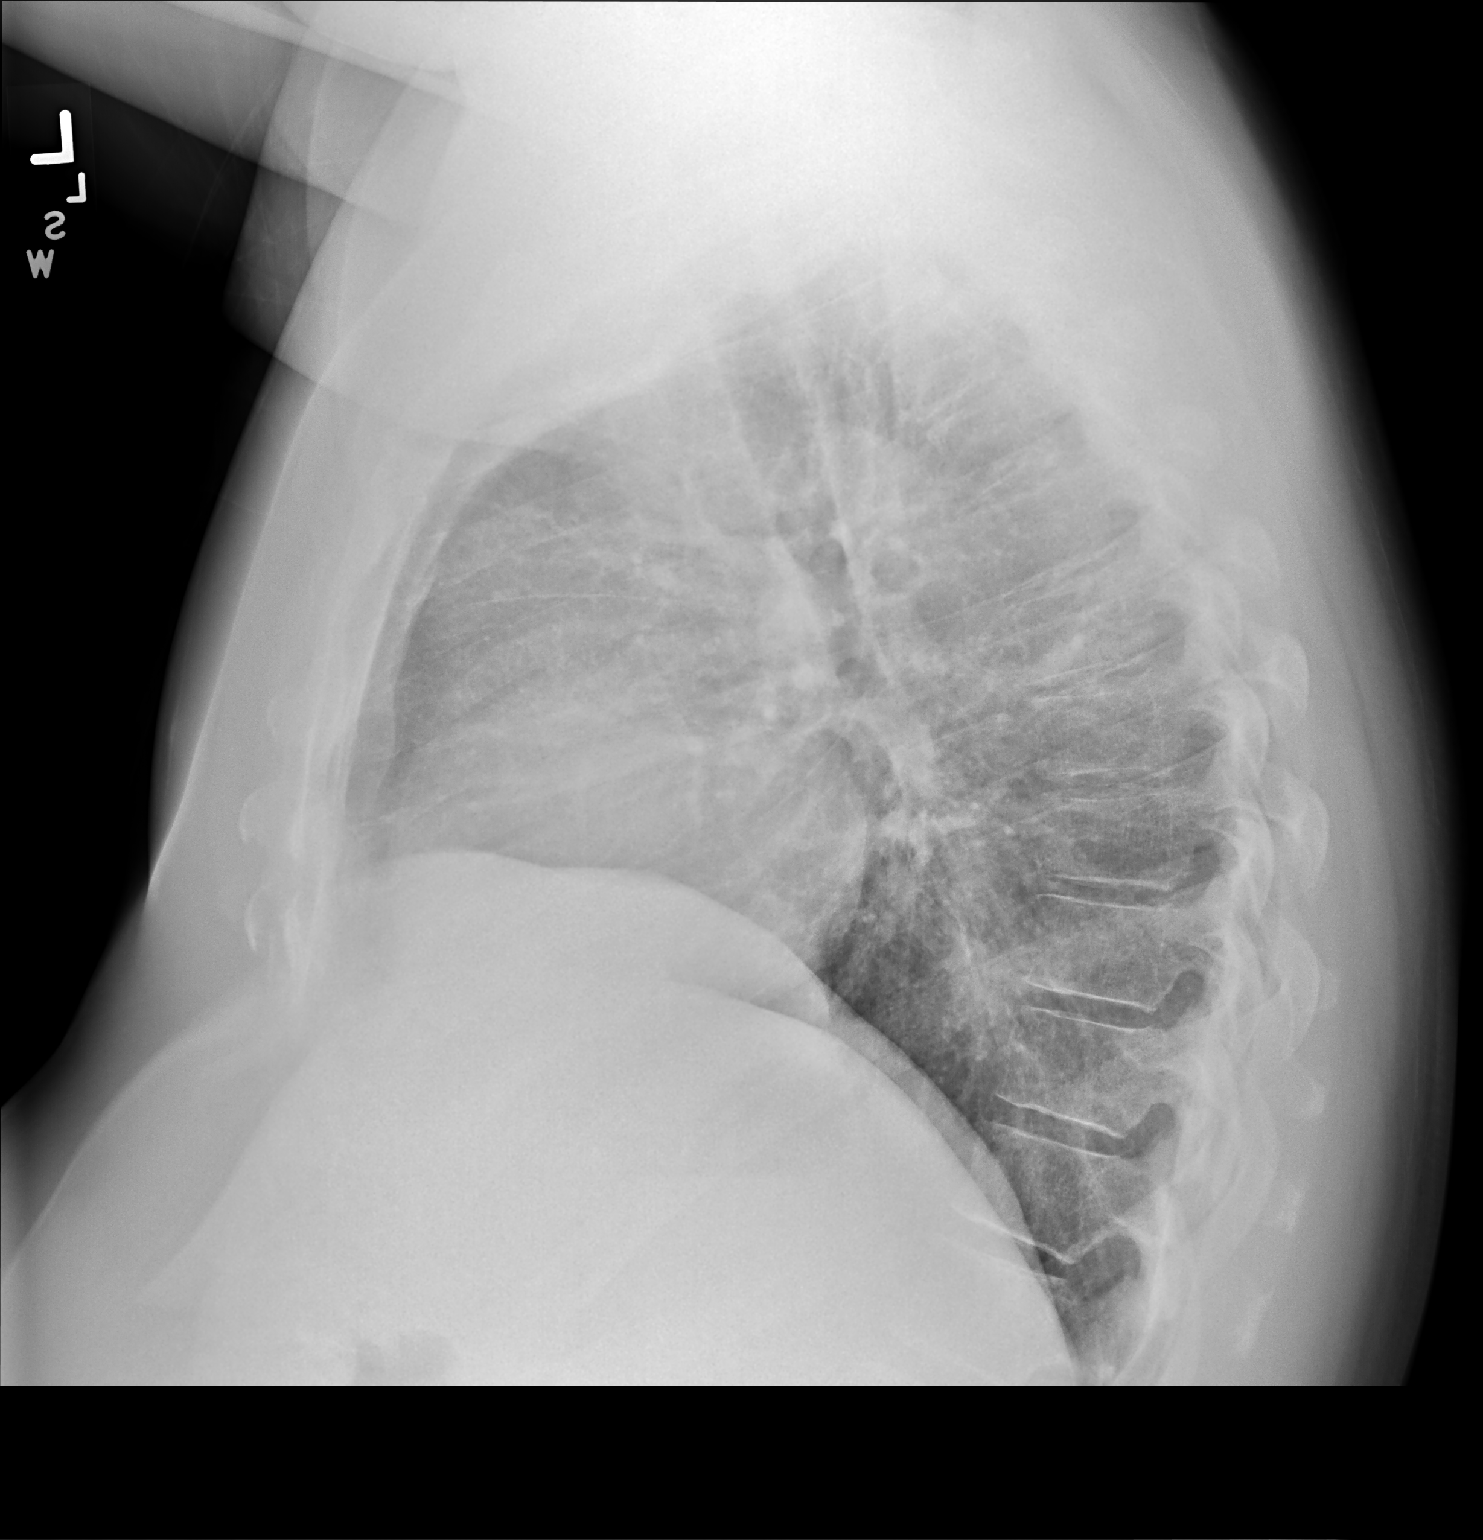

[2 of 2 positions shown; findings below may reference images not displayed]

FINDINGS: Examination demonstrates low lung volumes with possible mild
opacification over the posterior left base which may be due to
vascular crowding versus early infection. No evidence of effusion.
Cardiomediastinal silhouette is within normal. Bones and soft
tissues are normal.
IMPRESSION: Mild opacification of the left retrocardiac region which may be due
to vascular crowding versus early infection. Recommend follow-up to
resolution.

## 2017-05-18 ENCOUNTER — Encounter: Payer: Self-pay | Admitting: Internal Medicine

## 2017-05-18 ENCOUNTER — Ambulatory Visit (INDEPENDENT_AMBULATORY_CARE_PROVIDER_SITE_OTHER): Payer: Managed Care, Other (non HMO) | Admitting: Internal Medicine

## 2017-05-18 VITALS — BP 148/96 | HR 87 | Temp 98.5°F | Resp 15 | Ht 68.0 in | Wt 233.6 lb

## 2017-05-18 DIAGNOSIS — R0683 Snoring: Secondary | ICD-10-CM

## 2017-05-18 DIAGNOSIS — E6609 Other obesity due to excess calories: Secondary | ICD-10-CM

## 2017-05-18 DIAGNOSIS — Z23 Encounter for immunization: Secondary | ICD-10-CM | POA: Diagnosis not present

## 2017-05-18 DIAGNOSIS — E7800 Pure hypercholesterolemia, unspecified: Secondary | ICD-10-CM | POA: Diagnosis not present

## 2017-05-18 DIAGNOSIS — R030 Elevated blood-pressure reading, without diagnosis of hypertension: Secondary | ICD-10-CM | POA: Diagnosis not present

## 2017-05-18 DIAGNOSIS — Z0000 Encounter for general adult medical examination without abnormal findings: Secondary | ICD-10-CM | POA: Diagnosis not present

## 2017-05-18 DIAGNOSIS — I10 Essential (primary) hypertension: Secondary | ICD-10-CM | POA: Diagnosis not present

## 2017-05-18 DIAGNOSIS — R5383 Other fatigue: Secondary | ICD-10-CM

## 2017-05-18 DIAGNOSIS — Z6835 Body mass index (BMI) 35.0-35.9, adult: Secondary | ICD-10-CM

## 2017-05-18 LAB — MICROALBUMIN / CREATININE URINE RATIO
CREATININE, U: 181.5 mg/dL
MICROALB/CREAT RATIO: 0.6 mg/g (ref 0.0–30.0)
Microalb, Ur: 1 mg/dL (ref 0.0–1.9)

## 2017-05-18 LAB — COMPREHENSIVE METABOLIC PANEL
ALT: 16 U/L (ref 0–53)
AST: 17 U/L (ref 0–37)
Albumin: 4.2 g/dL (ref 3.5–5.2)
Alkaline Phosphatase: 52 U/L (ref 39–117)
BUN: 14 mg/dL (ref 6–23)
CHLORIDE: 104 meq/L (ref 96–112)
CO2: 29 meq/L (ref 19–32)
Calcium: 9.4 mg/dL (ref 8.4–10.5)
Creatinine, Ser: 1 mg/dL (ref 0.40–1.50)
GFR: 82.81 mL/min (ref >60.00)
GLUCOSE: 93 mg/dL (ref 70–99)
POTASSIUM: 4 meq/L (ref 3.5–5.1)
SODIUM: 139 meq/L (ref 135–145)
Total Bilirubin: 0.6 mg/dL (ref 0.2–1.2)
Total Protein: 7 g/dL (ref 6.0–8.3)

## 2017-05-18 LAB — LIPID PANEL
CHOL/HDL RATIO: 5
Cholesterol: 206 mg/dL — ABNORMAL HIGH (ref 0–200)
HDL: 39.7 mg/dL (ref >39.00)
NONHDL: 166.59
TRIGLYCERIDES: 201 mg/dL — AB (ref 0.0–149.0)
VLDL: 40.2 mg/dL — ABNORMAL HIGH (ref 0.0–40.0)

## 2017-05-18 LAB — TSH: TSH: 1.67 u[IU]/mL (ref 0.35–4.50)

## 2017-05-18 LAB — LDL CHOLESTEROL, DIRECT: LDL DIRECT: 141 mg/dL

## 2017-05-18 NOTE — Patient Instructions (Addendum)
I recommend that you lose 23 lbs,  Or 10% of your body weight over the next 6 months..  This will reduce your blood pressure and may lessen your symptoms of sleep apnea.  your blood pressure today is  above the currently recommended acceptable standards of 120/70, so I am recommending that you  cehck your blood pressure at home at least 5 times over the next two weeks and send me the readings.   If they are elevated,  I will recommend that you start a mild diuretic called hydrochlorthiazide (HCTZ for short) to lower it, and will send it  to your pharmacy for a 30 day trial.    If there is protein in your urine today,  This will necessitate a different medication to protect your kidneys from the blood pressure    This is  Dr. Lupita Dawn  example of a  "Low GI"  Diet:  It will allow you to lose 4 to 8  lbs  per month if you follow it carefully.  Your goal with exercise is a minimum of 30 minutes of aerobic exercise 5 days per week (Walking does not count once it becomes easy!)    All of the foods can be found at grocery stores and in bulk at [employer].  The Atkins protein bars and shakes are available in more varieties at [employer], [employer] and [place].     7 AM Breakfast:  Choose from the following:  Low carbohydrate Protein  Shakes (I recommend the  Premier Protein chocolate shakes,  EAS AdvantEdge "Carb Control" shakes  Or the Atkins shakes all are under 3 net carbs)     a scrambled egg/bacon/cheese burrito made with [employer]'s "carb balance" whole wheat tortilla  (about 10 net carbs )  Regulatory affairs officer (basically a quiche without the pastry crust) that is eaten cold and very convenient way to get your eggs.  8 carbs)  If you make your own protein shakes, avoid bananas and pineapple,  And use low carb greek yogurt or original /unsweetened almond or soy milk    Avoid cereal and bananas, oatmeal and cream of wheat and grits. They are loaded with carbohydrates!   10 AM: high  protein snack:  Protein bar by Atkins (the snack size, under 200 cal, usually < 6 net carbs).    A stick of cheese:  Around 1 carb,  100 cal     Dannon Light n Fit [place] Yogurt  (80 cal, 8 carbs)  Other so called "protein bars" and Greek yogurts tend to be loaded with carbohydrates.  Remember, in food advertising, the word "energy" is synonymous for " carbohydrate."  Lunch:   A Sandwich using the bread choices listed, Can use any  Eggs,  lunchmeat, grilled meat or canned tuna), avocado, regular mayo/mustard  and cheese.  A Salad using blue cheese, ranch,  Goddess or vinagrette,  Avoid taco shells, croutons or "confetti" and no "candied nuts" but regular nuts OK.   No pretzels, nabs  or chips.  Pickles and miniature sweet peppers are a good low carb alternative that provide a "crunch"  The bread is the only source of carbohydrate in a sandwich and  can be decreased by trying some of the attached alternatives to traditional loaf bread   Avoid "Low fat dressings, as well as Barry Brunner and [employer] dressings They are loaded with sugar!   3 PM/ Mid day  Snack:  Consider  1 ounce of  almonds, walnuts, pistachios, pecans, peanuts,  Macadamia nuts or a nut medley.  Avoid "granola and granola bars "  Mixed nuts are ok in moderation as long as there are no raisins,  cranberries or dried fruit.   KIND bars are OK if you get the low glycemic index variety   Try the prosciutto/mozzarella cheese sticks by [employer]  In deli /backery section   High protein      6 PM  Dinner:     Meat/fowl/fish with a green salad, and either broccoli, cauliflower, green beans, spinach, brussel sprouts or  Lima beans. DO NOT BREAD THE PROTEIN!!      There is a low carb pasta by Dreamfield's that is acceptable and tastes great: only 5 digestible carbs/serving.( All grocery stores but BJs carry it ) Several ready made meals are available low carb:   Try Michel Angelo's chicken piccata or chicken or eggplant parm over low  carb pasta.(Lowes and BJs)   Marjory Lies Sanchez's "Carnitas" (pulled pork, no sauce,  0 carbs) or his beef pot roast to make a dinner burrito (at [employer])  Pesto over low carb pasta (bj's sells a good quality pesto in the center refrigerated section of the deli   Try satueeing  Cheral Marker with mushroooms as a good side   [employer] makes a mashed cauliflower that tastes like mashed potatoes  Whole wheat pasta is still full of digestible carbs and  Not as low in glycemic index as Dreamfield's.   Brown rice is still rice,  So skip the rice and noodles if you eat [place] or [place] (or at least limit to 1/2 cup)  9 PM snack :   Breyer's "low carb" fudgsicle or  ice cream bar (Carb Smart line), or  Weight Watcher's ice cream bar , or another "no sugar added" ice cream;  a serving of fresh berries/cherries with whipped cream   Cheese or DANNON'S LlGHT N FIT GREEK YOGURT  8 ounces of [place] unsweetened almond/cococunut milk    Treat yourself to a parfait made with whipped cream blueberiies, walnuts and vanilla greek yogurt  Avoid bananas, pineapple, grapes  and watermelon on a regular basis because they are high in sugar.  THINK OF THEM AS DESSERT  Remember that snack Substitutions should be less than 10 NET carbs per serving and meals < 20 carbs. Remember to subtract fiber grams to get the "net carbs."

## 2017-05-18 NOTE — Progress Notes (Signed)
Patient ID: Tyler Peters, male    DOB: 02-26-1961  Age: 56 y.o. MRN: [id number]  The patient is here for annual prevetive  examination and management of other chronic and acute problems.   The risk factors are reflected in the social history.  The roster of all physicians providing medical care to patient - is listed in the Snapshot section of the chart.  Activities of daily living:  The patient is 100% independent in all ADLs: dressing, toileting, feeding as well as independent mobility  Home safety : The patient has smoke detectors in the home. They wear seatbelts.  There are no firearms at home. There is no violence in the home.   There is no risks for hepatitis, STDs or HIV. There is no   history of blood transfusion. They have no travel history to infectious disease endemic areas of the world.  The patient has seen their dentist in the last six month. They have seen their eye doctor in the last year.  They do not  have excessive sun exposure. Discussed the need for sun protection: hats, long sleeves and use of sunscreen if there is significant sun exposure.   Diet: the importance of a healthy diet is discussed.  He has a  Relatively  healthy diet.  The benefits of regular aerobic exercise were discussed. He does not exercise regularly. .   Depression screen: there are no signs or vegative symptoms of depression- irritability, change in appetite, anhedonia, sadness/tearfullness.    The following portions of the patient's history were reviewed and updated as appropriate: allergies, current medications, past family history, past medical history,  past surgical history, past social history  and problem list.  Visual acuity was not assessed per patient preference since she has regular follow up with her ophthalmologist. Hearing and body mass index were assessed and reviewed.   During the course of the visit the patient was educated and counseled about appropriate screening and preventive  services including : fall prevention , diabetes screening, nutrition counseling, colorectal cancer screening, and recommended immunizations.    CC: The primary encounter diagnosis was Fatigue, unspecified type. Diagnoses of Pure hypercholesterolemia, Elevated blood pressure reading in office without diagnosis of hypertension, Need for immunization against influenza, Hypertension, essential, Class 2 obesity due to excess calories without serious comorbidity with body mass index (BMI) of 35.0 to 35.9 in adult, Encounter for preventive health examination, and Snoring were also pertinent to this visit.  History Tyler Peters has a past medical history of Episodic recurrent vertigo; Episodic recurrent vertigo; History of acute prostatitis (Fall 2008); Hyperlipidemia, mixed; and Testicular pain (2008).   He has a past surgical history that includes Nasal septum surgery (2004).   His family history includes Diabetes in his maternal grandmother; Heart disease in his father and maternal grandmother.He reports that he has never smoked. He has never used smokeless tobacco. He reports that he does not drink alcohol or use drugs.  Outpatient Medications Prior to Visit  Medication Sig Dispense Refill  . albuterol (PROVENTIL HFA;VENTOLIN HFA) 108 (90 Base) MCG/ACT inhaler Inhale 2 puffs into the lungs every 6 (six) hours as needed for wheezing or shortness of breath. 1 Inhaler 0  . beclomethasone (QVAR) 40 MCG/ACT inhaler Inhale 2 puffs into the lungs 2 (two) times daily. 1 Inhaler 0  . Cholecalciferol (VITAMIN D3) 1000 units CAPS Take 1 capsule by mouth daily.    . Multiple Vitamins-Minerals (MULTIVITAMIN WITH MINERALS) tablet Take 1 tablet by mouth daily.    Marland Kitchen  benzonatate (TESSALON) 200 MG capsule Take 1 capsule (200 mg total) by mouth 3 (three) times daily as needed for cough. (Patient not taking: Reported on 05/18/2017) 60 capsule 1  . Phenyleph-Doxylamine-DM-APAP (NYQUIL SEVERE COLD/FLU) 5-6.25-10-325 MG/15ML LIQD  Take 1 oz by mouth at bedtime as needed. Reported on 02/15/2016    . predniSONE (DELTASONE) 10 MG tablet 6 tablets on Day 1 , then reduce by 1 tablet daily until gone (Patient not taking: Reported on 05/18/2017) 21 tablet 0  . triamcinolone cream (KENALOG) 0.1 % Apply 1 application topically 2 (two) times daily. (Patient not taking: Reported on 05/18/2017) 30 g 0   No facility-administered medications prior to visit.     Review of Systems   Patient denies headache, fevers, malaise, unintentional weight loss, skin rash, eye pain, sinus congestion and sinus pain, sore throat, dysphagia,  hemoptysis , cough, dyspnea, wheezing, chest pain, palpitations, orthopnea, edema, abdominal pain, nausea, melena, diarrhea, constipation, flank pain, dysuria, hematuria, urinary  Frequency, nocturia, numbness, tingling, seizures,  Focal weakness, Loss of consciousness,  Tremor, insomnia, depression, anxiety, and suicidal ideation.      Objective:  BP (!) 148/96 (BP Location: Left Arm, Patient Position: Sitting, Cuff Size: Normal)   Pulse 87   Temp 98.5 F (36.9 C) (Oral)   Resp 15   Ht 5\' 8"  (1.727 m)   Wt 233 lb 9.6 oz (106 kg)   SpO2 (!) 87%   BMI 35.52 kg/m   Physical Exam   General appearance: alert, cooperative and appears stated age Ears: normal TM's and external ear canals both ears Throat: lips, mucosa, and tongue normal; teeth and gums normal Neck: no adenopathy, no carotid bruit, supple, symmetrical, trachea midline and thyroid not enlarged, symmetric, no tenderness/mass/nodules Back: symmetric, no curvature. ROM normal. No CVA tenderness. Lungs: clear to auscultation bilaterally Heart: regular rate and rhythm, S1, S2 normal, no murmur, click, rub or gallop Abdomen: soft, non-tender; bowel sounds normal; no masses,  no organomegaly Pulses: 2+ and symmetric Skin: Skin color, texture, turgor normal. No rashes or lesions Lymph nodes: Cervical, supraclavicular, and axillary nodes  normal.   Assessment & Plan:   Problem List Items Addressed This Visit    Encounter for preventive health examination    Annual comprehensive preventive exam was done as well as an evaluation and management of current untreated conditions.  During the course of the visit the patient was educated and counseled about appropriate screening and preventive services including :  diabetes screening, lipid analysis with projected  10 year  risk for CAD , nutrition counseling, breast, cervical and colorectal cancer screening, and recommended immunizations.  Printed recommendations for health maintenance screenings was given.  Lab Results  Component Value Date   CHOL 206 (H) 05/18/2017   HDL 39.70 05/18/2017   LDLDIRECT 141.0 05/18/2017   TRIG 201.0 (H) 05/18/2017   CHOLHDL 5 05/18/2017   No results found for: HGBA1C Lab Results  Component Value Date   TSH 1.67 05/18/2017         Hypertension, essential    He has not been checking his BP at home but has head repeatedly elevated readings in office.  Etiology is likely  untreated OSA given history of snoring, 1+ pitting edema on exam, and obesity . Renal function, lytes and UA are normal .  Starting hctz .   Lab Results  Component Value Date   CREATININE 1.00 05/18/2017   Lab Results  Component Value Date   NA 139 05/18/2017   K  4.0 05/18/2017   CL 104 05/18/2017   CO2 29 05/18/2017   Lab Results  Component Value Date   MICROALBUR 1.0 05/18/2017        Obesity, unspecified    I have addressed  BMI and recommended wt loss of 10% of body weight over the next 6 months using a low glycemic index diet and regular exercise a minimum of 5 days per week.        Snoring    sleeep apnea suspected based on exam. He has deferred testing again        Other Visit Diagnoses    Fatigue, unspecified type    -  Primary   Relevant Orders   Comprehensive metabolic panel (Completed)   TSH (Completed)   Pure hypercholesterolemia        Relevant Orders   Lipid panel (Completed)   Need for immunization against influenza       Relevant Orders   Flu Vaccine QUAD 36+ mos IM (Completed)      I have discontinued Mr. Alen Phenyleph-Doxylamine-DM-APAP, benzonatate, triamcinolone cream, and predniSONE. I am also having him maintain his multivitamin with minerals, Vitamin D3, beclomethasone, and albuterol.  No orders of the defined types were placed in this encounter.   Medications Discontinued During This Encounter  Medication Reason  . benzonatate (TESSALON) 200 MG capsule Patient has not taken in last 30 days  . Phenyleph-Doxylamine-DM-APAP (NYQUIL SEVERE COLD/FLU) 5-6.25-10-325 MG/15ML LIQD Patient has not taken in last 30 days  . predniSONE (DELTASONE) 10 MG tablet Patient has not taken in last 30 days  . triamcinolone cream (KENALOG) 0.1 % Patient has not taken in last 30 days    Follow-up: Return in about 6 months (around 11/16/2017).   Crecencio Mc, MD

## 2017-05-20 ENCOUNTER — Encounter: Payer: Self-pay | Admitting: Internal Medicine

## 2017-05-20 MED ORDER — HYDROCHLOROTHIAZIDE 25 MG PO TABS: 25.0000 mg | ORAL_TABLET | Freq: Every day | ORAL | 3 refills | Status: DC

## 2017-05-20 NOTE — Assessment & Plan Note (Signed)
sleeep apnea suspected based on exam. He has deferred testing again

## 2017-05-20 NOTE — Assessment & Plan Note (Addendum)
He has not been checking his BP at home but has head repeatedly elevated readings in office.  Etiology is likely  untreated OSA given history of snoring, 1+ pitting edema on exam, and obesity . Renal function, lytes and UA are normal .  Starting hctz .   Lab Results  Component Value Date   CREATININE 1.00 05/18/2017   Lab Results  Component Value Date   NA 139 05/18/2017   K 4.0 05/18/2017   CL 104 05/18/2017   CO2 29 05/18/2017   Lab Results  Component Value Date   MICROALBUR 1.0 05/18/2017

## 2017-05-20 NOTE — Assessment & Plan Note (Addendum)
Annual comprehensive preventive exam was done as well as an evaluation and management of current untreated conditions.  During the course of the visit the patient was educated and counseled about appropriate screening and preventive services including :  diabetes screening, lipid analysis with projected  10 year  risk for CAD , nutrition counseling, breast, cervical and colorectal cancer screening, and recommended immunizations.  Printed recommendations for health maintenance screenings was given.  Lab Results  Component Value Date   CHOL 206 (H) 05/18/2017   HDL 39.70 05/18/2017   LDLDIRECT 141.0 05/18/2017   TRIG 201.0 (H) 05/18/2017   CHOLHDL 5 05/18/2017   No results found for: HGBA1C Lab Results  Component Value Date   TSH 1.67 05/18/2017

## 2017-05-20 NOTE — Assessment & Plan Note (Signed)
I have addressed  BMI and recommended wt loss of 10% of body weight over the next 6 months using a low glycemic index diet and regular exercise a minimum of 5 days per week.   

## 2017-09-14 ENCOUNTER — Ambulatory Visit: Payer: Self-pay | Admitting: *Deleted

## 2017-09-14 ENCOUNTER — Ambulatory Visit: Payer: Managed Care, Other (non HMO) | Admitting: Family Medicine

## 2017-09-14 ENCOUNTER — Encounter: Payer: Self-pay | Admitting: Family Medicine

## 2017-09-14 DIAGNOSIS — R059 Cough, unspecified: Secondary | ICD-10-CM

## 2017-09-14 DIAGNOSIS — R05 Cough: Secondary | ICD-10-CM

## 2017-09-14 MED ORDER — DOXYCYCLINE HYCLATE 100 MG PO TABS: 100.0000 mg | ORAL_TABLET | Freq: Two times a day (BID) | ORAL | 0 refills | Status: DC

## 2017-09-14 MED ORDER — PREDNISONE 10 MG PO TABS: ORAL_TABLET | ORAL | 0 refills | Status: DC

## 2017-09-14 NOTE — Patient Instructions (Signed)
Use albuterol if needed, restart qvar and rinse after use.   If more wheeze, then start prednisone with food.  If fevers or discolored sputum, then start the antibiotics.  Take care.  Glad to see you.  Update [place] as needed.

## 2017-09-14 NOTE — Progress Notes (Signed)
Sx started about 5-6 days ago.  Aches, fevers, chills initially.  Aches continued for a few days.  Then he had more runny nose.  No ST.  Some cough, some nausea.  Ears feel stuffy.  Still on PRN SABA, hadn't been on qvar recently.  Cough is dry.  Some occ wheeze/extra noise exhalation.  No diarrhea, no rash.  He does have some relief temporarily with SABA.  Fatigued.  He feels some better today but still tired in general.    Meds, vitals, and allergies reviewed.   ROS: Per HPI unless specifically indicated in ROS section   GEN: nad, alert and oriented HEENT: mucous membranes moist, tm w/o erythema, nasal exam w/o erythema, clear discharge noted,  OP with cobblestoning NECK: supple w/o LA CV: rrr.   PULM: ctab, no inc wob except for scant B wheeze at end expiration.   EXT: no edema SKIN: no acute rash

## 2017-09-14 NOTE — Telephone Encounter (Signed)
Pt called with cold symptoms that developed over the last part of the weekend. He states he was feverish in the beginning but did not check his temp.  He now has a non productive cough but has wheezing. He c/o some aches and pains. He has pressure in his ears and head and makes him feel dizzy. And he is not able to work with the dizziness.  Appointment made for today per protocol.  Reason for Disposition . [1] Sinus congestion (pressure, fullness) AND [2] present > 10 days  Answer Assessment - Initial Assessment Questions 1. ONSET: "When did the nasal discharge start?"      none 2. AMOUNT: "How much discharge is there?"      n/a 3. COUGH: "Do you have a cough?" If yes, ask: "Describe the color of your sputum" (clear, white, yellow, green)     Yes, non productive 4. RESPIRATORY DISTRESS: "Describe your breathing."      wheezing 5. FEVER: "Do you have a fever?" If so, ask: "What is your temperature, how was it measured, and when did it start?"     Not now 6. SEVERITY: "Overall, how bad are you feeling right now?" (e.g., doesn't interfere with normal activities, staying home from school/work, staying in bed)      dizziness 7. OTHER SYMPTOMS: "Do you have any other symptoms?" (e.g., sore throat, earache, wheezing, vomiting)     Ear pressure, wheezing 8. PREGNANCY: "Is there any chance you are pregnant?" "When was your last menstrual period?"     no  Protocols used: COMMON COLD-A-AH

## 2017-09-15 DIAGNOSIS — R051 Acute cough: Secondary | ICD-10-CM | POA: Insufficient documentation

## 2017-09-15 DIAGNOSIS — R059 Cough, unspecified: Secondary | ICD-10-CM | POA: Insufficient documentation

## 2017-09-15 DIAGNOSIS — R05 Cough: Secondary | ICD-10-CM | POA: Insufficient documentation

## 2017-09-15 NOTE — Assessment & Plan Note (Signed)
Nontoxic.  D/w pt . Use albuterol if needed, restart qvar and rinse after use.   If more wheeze, then start prednisone with food.  If fevers or discolored sputum, then start the doxy.  Update [place] as needed.   He agrees.

## 2017-10-23 ENCOUNTER — Other Ambulatory Visit: Payer: Self-pay | Admitting: Internal Medicine

## 2017-10-23 DIAGNOSIS — I10 Essential (primary) hypertension: Secondary | ICD-10-CM

## 2017-11-16 ENCOUNTER — Encounter: Payer: Self-pay | Admitting: Internal Medicine

## 2017-11-16 ENCOUNTER — Ambulatory Visit: Payer: Managed Care, Other (non HMO) | Admitting: Internal Medicine

## 2017-11-16 VITALS — BP 140/82 | HR 93 | Temp 98.1°F | Resp 15 | Ht 68.0 in | Wt 227.8 lb

## 2017-11-16 DIAGNOSIS — I10 Essential (primary) hypertension: Secondary | ICD-10-CM | POA: Diagnosis not present

## 2017-11-16 DIAGNOSIS — K219 Gastro-esophageal reflux disease without esophagitis: Secondary | ICD-10-CM | POA: Diagnosis not present

## 2017-11-16 DIAGNOSIS — E6609 Other obesity due to excess calories: Secondary | ICD-10-CM

## 2017-11-16 DIAGNOSIS — Z6835 Body mass index (BMI) 35.0-35.9, adult: Secondary | ICD-10-CM | POA: Diagnosis not present

## 2017-11-16 DIAGNOSIS — Z1211 Encounter for screening for malignant neoplasm of colon: Secondary | ICD-10-CM

## 2017-11-16 MED ORDER — BECLOMETHASONE DIPROPIONATE 40 MCG/ACT IN AERS: 2.0000 | INHALATION_SPRAY | Freq: Two times a day (BID) | RESPIRATORY_TRACT | 0 refills | Status: DC

## 2017-11-16 MED ORDER — ZOSTER VAC RECOMB ADJUVANTED 50 MCG/0.5ML IM SUSR: 0.5000 mL | Freq: Once | INTRAMUSCULAR | 1 refills | Status: AC

## 2017-11-16 MED ORDER — HYDROCHLOROTHIAZIDE 25 MG PO TABS: 25.0000 mg | ORAL_TABLET | Freq: Every day | ORAL | 1 refills | Status: DC

## 2017-11-16 NOTE — Patient Instructions (Signed)
  The new goals for optimal blood pressure management are 120/70.  Please check your blood pressure once a week  at home and let me know  If  the readings are consistently higher   BMET today to check lytes,  Kidney function   The ShingRx vaccine is now available here and  in local pharmacies and is 97% preventive  against shingles

## 2017-11-16 NOTE — Progress Notes (Signed)
Subjective:  Patient ID: Tyler Peters, male    DOB: [birth year]  Age: 57 y.o. MRN: [id number]  CC: The primary encounter diagnosis was Colon cancer screening. Diagnoses of Hypertension, essential, Class 2 obesity due to excess calories without serious comorbidity with body mass index (BMI) of 35.0 to 35.9 in adult, and Gastroesophageal reflux disease, esophagitis presence not specified were also pertinent to this visit.  HPI Tyler Peters presents for follow up on hypertension, GERD, obesity  and cough.  Cough: resolved. he was treated by Dr Damita Dunnings in mid February with qvar, albuterol .   Was given prednisone and doxy for prn use but never required use of either  To resolve symptoms.   Averages on episode of bronchitis every year  That follows a viral illness.   Has been using Qvar daily  But not all year long  Obesity:  Has lost ten lbs by his home scales.  Portion reduction.  Fewer starches.   Hypertension: patient checks blood pressure twice weekly at home.  Readings have been for the most part < 140/80 at rest . Patient is following a reduce salt diet most days and is taking medications as prescribed Outpatient Medications Prior to Visit  Medication Sig Dispense Refill  . albuterol (PROVENTIL HFA;VENTOLIN HFA) 108 (90 Base) MCG/ACT inhaler Inhale 2 puffs into the lungs every 6 (six) hours as needed for wheezing or shortness of breath. 1 Inhaler 0  . Cholecalciferol (VITAMIN D3) 1000 units CAPS Take 1 capsule by mouth daily.    . Multiple Vitamins-Minerals (MULTIVITAMIN WITH MINERALS) tablet Take 1 tablet by mouth daily.    . beclomethasone (QVAR) 40 MCG/ACT inhaler Inhale 2 puffs into the lungs 2 (two) times daily. 1 Inhaler 0  . hydrochlorothiazide (HYDRODIURIL) 25 MG tablet TAKE 1 TABLET BY MOUTH EVERY DAY 30 tablet 3  . doxycycline (VIBRA-TABS) 100 MG tablet Take 1 tablet (100 mg total) by mouth 2 (two) times daily. (Patient not taking: Reported on 11/16/2017) 20 tablet 0  . predniSONE  (DELTASONE) 10 MG tablet Take 2 a day for 5 days, then 1 a day for 5 days, with food. Don't take with aleve/ibuprofen. (Patient not taking: Reported on 11/16/2017) 15 tablet 0   No facility-administered medications prior to visit.     Review of Systems;  Patient denies headache, fevers, malaise, unintentional weight loss, skin rash, eye pain, sinus congestion and sinus pain, sore throat, dysphagia,  hemoptysis , cough, dyspnea, wheezing, chest pain, palpitations, orthopnea, edema, abdominal pain, nausea, melena, diarrhea, constipation, flank pain, dysuria, hematuria, urinary  Frequency, nocturia, numbness, tingling, seizures,  Focal weakness, Loss of consciousness,  Tremor, insomnia, depression, anxiety, and suicidal ideation.      Objective:  BP 140/82 (BP Location: Left Arm, Patient Position: Sitting, Cuff Size: Normal)   Pulse 93   Temp 98.1 F (36.7 C) (Oral)   Resp 15   Ht 5\' 8"  (1.727 m)   Wt 227 lb 12.8 oz (103.3 kg)   SpO2 97%   BMI 34.64 kg/m   BP Readings from Last 3 Encounters:  11/16/17 140/82  09/14/17 108/80  05/18/17 (!) 148/96    Wt Readings from Last 3 Encounters:  11/16/17 227 lb 12.8 oz (103.3 kg)  09/14/17 225 lb 4 oz (102.2 kg)  05/18/17 233 lb 9.6 oz (106 kg)    General appearance: alert, cooperative and appears stated age Ears: normal TM's and external ear canals both ears Throat: lips, mucosa, and tongue normal; teeth and gums normal  Neck: no adenopathy, no carotid bruit, supple, symmetrical, trachea midline and thyroid not enlarged, symmetric, no tenderness/mass/nodules Back: symmetric, no curvature. ROM normal. No CVA tenderness. Lungs: clear to auscultation bilaterally Heart: regular rate and rhythm, S1, S2 normal, no murmur, click, rub or gallop Abdomen: soft, non-tender; bowel sounds normal; no masses,  no organomegaly Pulses: 2+ and symmetric Skin: Skin color, texture, turgor normal. No rashes or lesions Lymph nodes: Cervical, supraclavicular,  and axillary nodes normal.  No results found for: HGBA1C  Lab Results  Component Value Date   CREATININE 0.99 11/16/2017   CREATININE 1.00 05/18/2017   CREATININE 0.97 11/10/2015    Lab Results  Component Value Date   WBC 8.8 01/18/2016   HGB 15.8 01/18/2016   HCT 46.7 01/18/2016   PLT 290.0 01/18/2016   GLUCOSE 107 (H) 11/16/2017   CHOL 206 (H) 05/18/2017   TRIG 201.0 (H) 05/18/2017   HDL 39.70 05/18/2017   LDLDIRECT 141.0 05/18/2017   ALT 16 05/18/2017   AST 17 05/18/2017   NA 142 11/16/2017   K 3.8 11/16/2017   CL 102 11/16/2017   CREATININE 0.99 11/16/2017   BUN 13 11/16/2017   CO2 23 11/16/2017   TSH 1.67 05/18/2017   PSA 0.99 11/10/2015   MICROALBUR 1.0 05/18/2017       Assessment & Plan:   Problem List Items Addressed This Visit    Obesity, unspecified    I have congratulated her in reduction of   BMI and encouraged  Continued weight loss with goal of 10% of body weight over the next 6 months using a low glycemic index diet and regular exercise a minimum of 5 days per week.        Hypertension, essential    Well controlled on current regimen. Renal function stable, no changes today.  Lab Results  Component Value Date   CREATININE 0.99 11/16/2017   Lab Results  Component Value Date   NA 142 11/16/2017   K 3.8 11/16/2017   CL 102 11/16/2017   CO2 23 11/16/2017         Relevant Medications   hydrochlorothiazide (HYDRODIURIL) 25 MG tablet   Other Relevant Orders   Basic metabolic panel (Completed)   GERD (gastroesophageal reflux disease)    Discussed current controversy regarding prolonged use of PPI in patients without documented Barretts esophagus.  Patient has no prior EGD but has been on PPI therapy for > 5 years (per patient).  Suggested trial of pepcid 20 mg daily.  If GERD symptoms return,  advised her to accept referral for EGD.        Other Visit Diagnoses    Colon cancer screening    -  Primary   Relevant Orders   Ambulatory  referral to General Surgery      I have discontinued Tyler Peters's predniSONE and doxycycline. I have also changed his hydrochlorothiazide. Additionally, I am having him start on Zoster Vaccine Adjuvanted. Lastly, I am having him maintain his multivitamin with minerals, Vitamin D3, albuterol, and beclomethasone.  Meds ordered this encounter  Medications  . beclomethasone (QVAR) 40 MCG/ACT inhaler    Sig: Inhale 2 puffs into the lungs 2 (two) times daily.    Dispense:  1 Inhaler    Refill:  0  . hydrochlorothiazide (HYDRODIURIL) 25 MG tablet    Sig: Take 1 tablet (25 mg total) by mouth daily.    Dispense:  90 tablet    Refill:  1  . Zoster Vaccine Adjuvanted [facility])  injection    Sig: Inject 0.5 mLs into the muscle once for 1 dose.    Dispense:  1 each    Refill:  1    Medications Discontinued During This Encounter  Medication Reason  . doxycycline (VIBRA-TABS) 100 MG tablet Completed Course  . predniSONE (DELTASONE) 10 MG tablet Completed Course  . beclomethasone (QVAR) 40 MCG/ACT inhaler Reorder  . hydrochlorothiazide (HYDRODIURIL) 25 MG tablet     Follow-up: Return in about 6 months (around 05/18/2018) for CPE.   Crecencio Mc, MD

## 2017-11-17 LAB — BASIC METABOLIC PANEL
BUN/Creatinine Ratio: 13 (ref 9–20)
BUN: 13 mg/dL (ref 6–24)
CALCIUM: 9.5 mg/dL (ref 8.7–10.2)
CO2: 23 mmol/L (ref 20–29)
CREATININE: 0.99 mg/dL (ref 0.76–1.27)
Chloride: 102 mmol/L (ref 96–106)
GFR calc Af Amer: 99 mL/min/{1.73_m2} (ref >59)
GFR, EST NON AFRICAN AMERICAN: 86 mL/min/{1.73_m2} (ref >59)
Glucose: 107 mg/dL — ABNORMAL HIGH (ref 65–99)
Potassium: 3.8 mmol/L (ref 3.5–5.2)
Sodium: 142 mmol/L (ref 134–144)

## 2017-11-18 NOTE — Assessment & Plan Note (Addendum)
Discussed current controversy regarding prolonged use of PPI in patients without documented Barretts esophagus.  Patient has no prior EGD but has been on PPI therapy for > 5 years (per patient).  Suggested trial of pepcid 20 mg daily.  If GERD symptoms return,  advised her to accept referral for EGD.  

## 2017-11-18 NOTE — Assessment & Plan Note (Signed)
I have congratulated her in reduction of   BMI and encouraged  Continued weight loss with goal of 10% of body weight over the next 6 months using a low glycemic index diet and regular exercise a minimum of 5 days per week.     

## 2017-11-18 NOTE — Assessment & Plan Note (Signed)
Well controlled on current regimen. Renal function stable, no changes today.  Lab Results  Component Value Date   CREATININE 0.99 11/16/2017   Lab Results  Component Value Date   NA 142 11/16/2017   K 3.8 11/16/2017   CL 102 11/16/2017   CO2 23 11/16/2017

## 2018-01-02 ENCOUNTER — Encounter: Payer: Self-pay | Admitting: General Surgery

## 2018-01-02 ENCOUNTER — Ambulatory Visit (INDEPENDENT_AMBULATORY_CARE_PROVIDER_SITE_OTHER): Payer: Managed Care, Other (non HMO) | Admitting: General Surgery

## 2018-01-02 VITALS — BP 134/72 | HR 78 | Resp 13 | Ht 68.0 in | Wt 227.0 lb

## 2018-01-02 DIAGNOSIS — Z1211 Encounter for screening for malignant neoplasm of colon: Secondary | ICD-10-CM

## 2018-01-02 NOTE — Progress Notes (Signed)
Patient ID: Tyler Peters, male   DOB: 1961/04/03, 57 y.o.   MRN: [id number]  Chief Complaint  Patient presents with  . Colonoscopy    HPI Tyler Peters is a 57 y.o. male here today for a evaluation of a screening colonoscopy. No GI problems at this time. Moves his bowels daily.  HPI  Past Medical History:  Diagnosis Date  . Episodic recurrent vertigo    evaluated by Dr. Tami Ribas with normal MRI  . Episodic recurrent vertigo    normal MRI   . History of acute prostatitis Fall 2008  . Hyperlipidemia, mixed   . Testicular pain 2008   prostatis, treated with Flomax by Dr. Jacqlyn Larsen    Past Surgical History:  Procedure Laterality Date  . NASAL SEPTUM SURGERY  2004    Family History  Problem Relation Age of Onset  . Heart disease Father   . Heart disease Maternal Grandmother   . Diabetes Maternal Grandmother     Social History Social History   Tobacco Use  . Smoking status: Never Smoker  . Smokeless tobacco: Never Used  Substance Use Topics  . Alcohol use: No  . Drug use: No    No Known Allergies  Current Outpatient Medications  Medication Sig Dispense Refill  . albuterol (PROVENTIL HFA;VENTOLIN HFA) 108 (90 Base) MCG/ACT inhaler Inhale 2 puffs into the lungs every 6 (six) hours as needed for wheezing or shortness of breath. 1 Inhaler 0  . beclomethasone (QVAR) 40 MCG/ACT inhaler Inhale 2 puffs into the lungs 2 (two) times daily. 1 Inhaler 0  . Cholecalciferol (VITAMIN D3) 1000 units CAPS Take 1 capsule by mouth daily.    . hydrochlorothiazide (HYDRODIURIL) 25 MG tablet Take 1 tablet (25 mg total) by mouth daily. 90 tablet 1  . Multiple Vitamins-Minerals (MULTIVITAMIN WITH MINERALS) tablet Take 1 tablet by mouth daily.     No current facility-administered medications for this visit.     Review of Systems Review of Systems  Constitutional: Negative.   Respiratory: Negative.   Cardiovascular: Negative.   Gastrointestinal: Negative.     Blood pressure 134/72,  pulse 78, resp. rate 13, height 5\' 8"  (1.727 m), weight 227 lb (103 kg).  Physical Exam Physical Exam  Constitutional: He is oriented to person, place, and time. He appears well-developed and well-nourished.  Cardiovascular: Normal rate, regular rhythm and normal heart sounds.  Pulmonary/Chest: Effort normal and breath sounds normal.  Neurological: He is alert and oriented to person, place, and time.  Skin: Skin is warm and dry.    Data Reviewed  Comprehensive metabolic panel dated May 18, 2017 showed normal renal function with a creatinine 1.0. Estimated GFR of 82.  Normal electrolytes.  Assessment    Candidate for screening colonoscopy.    Plan  Colonoscopy with possible biopsy/polypectomy prn: Information regarding the procedure, including its potential risks and complications (including but not limited to perforation of the bowel, which may require emergency surgery to repair, and bleeding) was verbally given to the patient. Educational information regarding lower intestinal endoscopy was given to the patient. Written instructions for how to complete the bowel prep using Miralax were provided. The importance of drinking ample fluids to avoid dehydration as a result of the prep emphasized.  HPI, Physical Exam, Assessment and Plan have been scribed under the direction and in the presence of Hervey Ard, MD.  Gaspar Cola, CMA  I have completed the exam and reviewed the above documentation for accuracy and completeness.  I agree with  the above.  Haematologist has been used and any errors in dictation or transcription are unintentional.  Hervey Ard, M.D., F.A.C.S.  Forest Gleason Karren Newland 01/02/2018, 9:24 PM

## 2018-01-02 NOTE — Patient Instructions (Signed)
Colonoscopy, Adult A colonoscopy is an exam to look at the entire large intestine. During the exam, a lubricated, bendable tube is inserted into the anus and then passed into the rectum, colon, and other parts of the large intestine. A colonoscopy is often done as a part of normal colorectal screening or in response to certain symptoms, such as anemia, persistent diarrhea, abdominal pain, and blood in the stool. The exam can help screen for and diagnose medical problems, including:  Tumors.  Polyps.  Inflammation.  Areas of bleeding.  Tell a health care provider about:  Any allergies you have.  All medicines you are taking, including vitamins, herbs, eye drops, creams, and over-the-counter medicines.  Any problems you or family members have had with anesthetic medicines.  Any blood disorders you have.  Any surgeries you have had.  Any medical conditions you have.  Any problems you have had passing stool. What are the risks? Generally, this is a safe procedure. However, problems may occur, including:  Bleeding.  A tear in the intestine.  A reaction to medicines given during the exam.  Infection (rare).  What happens before the procedure? Eating and drinking restrictions Follow instructions from your health care provider about eating and drinking, which may include:  A few days before the procedure - follow a low-fiber diet. Avoid nuts, seeds, dried fruit, raw fruits, and vegetables.  1-3 days before the procedure - follow a clear liquid diet. Drink only clear liquids, such as clear broth or bouillon, black coffee or tea, clear juice, clear soft drinks or sports drinks, gelatin dessert, and popsicles. Avoid any liquids that contain red or purple dye.  On the day of the procedure - do not eat or drink anything during the 2 hours before the procedure, or within the time period that your health care provider recommends.  Bowel prep If you were prescribed an oral bowel prep  to clean out your colon:  Take it as told by your health care provider. Starting the day before your procedure, you will need to drink a large amount of medicated liquid. The liquid will cause you to have multiple loose stools until your stool is almost clear or light green.  If your skin or anus gets irritated from diarrhea, you may use these to relieve the irritation: ? Medicated wipes, such as adult wet wipes with aloe and vitamin E. ? A skin soothing-product like petroleum jelly.  If you vomit while drinking the bowel prep, take a break for up to 60 minutes and then begin the bowel prep again. If vomiting continues and you cannot take the bowel prep without vomiting, call your health care provider.  General instructions  Ask your health care provider about changing or stopping your regular medicines. This is especially important if you are taking diabetes medicines or blood thinners.  Plan to have someone take you home from the hospital or clinic. What happens during the procedure?  An IV tube may be inserted into one of your veins.  You will be given medicine to help you relax (sedative).  To reduce your risk of infection: ? Your health care team will wash or sanitize their hands. ? Your anal area will be washed with soap.  You will be asked to lie on your side with your knees bent.  Your health care provider will lubricate a long, thin, flexible tube. The tube will have a camera and a light on the end.  The tube will be inserted into your   anus.  The tube will be gently eased through your rectum and colon.  Air will be delivered into your colon to keep it open. You may feel some pressure or cramping.  The camera will be used to take images during the procedure.  A small tissue sample may be removed from your body to be examined under a microscope (biopsy). If any potential problems are found, the tissue will be sent to a lab for testing.  If small polyps are found, your  health care provider may remove them and have them checked for cancer cells.  The tube that was inserted into your anus will be slowly removed. The procedure may vary among health care providers and hospitals. What happens after the procedure?  Your blood pressure, heart rate, breathing rate, and blood oxygen level will be monitored until the medicines you were given have worn off.  Do not drive for 24 hours after the exam.  You may have a small amount of blood in your stool.  You may pass gas and have mild abdominal cramping or bloating due to the air that was used to inflate your colon during the exam.  It is up to you to get the results of your procedure. Ask your health care provider, or the department performing the procedure, when your results will be ready. This information is not intended to replace advice given to you by your health care provider. Make sure you discuss any questions you have with your health care provider. Document Released: 07/15/2000 Document Revised: 05/18/2016 Document Reviewed: 09/29/2015 Elsevier Interactive Patient Education  2018 [employer]vier [employer]  

## 2018-01-03 ENCOUNTER — Telehealth: Payer: Self-pay | Admitting: *Deleted

## 2018-01-03 MED ORDER — POLYETHYLENE GLYCOL 3350 17 GM/SCOOP PO POWD: ORAL | 0 refills | Status: DC

## 2018-01-03 NOTE — Telephone Encounter (Signed)
Patient has been scheduled for a colonoscopy on 01-17-18 at [facility]. Miralax prescription has been sent in to the patient's pharmacy today. Colonoscopy instructions were provided to the patient at time of his appointment yesterday and have been reviewed with the patient over the phone today. This patient is aware to call the office if they have further questions.

## 2018-01-17 ENCOUNTER — Encounter: Payer: Self-pay | Admitting: Anesthesiology

## 2018-01-17 ENCOUNTER — Ambulatory Visit: Payer: Managed Care, Other (non HMO) | Admitting: Anesthesiology

## 2018-01-17 ENCOUNTER — Encounter
Admission: RE | Disposition: A | Discharge status: Home / Self Care | Payer: Self-pay | Source: Ambulatory Visit | Attending: General Surgery

## 2018-01-17 ENCOUNTER — Other Ambulatory Visit: Payer: Self-pay

## 2018-01-17 ENCOUNTER — Ambulatory Visit
Admission: RE | Admit: 2018-01-17 | Discharge: 2018-01-17 | Disposition: A | Discharge status: Home / Self Care | Payer: Managed Care, Other (non HMO) | Source: Ambulatory Visit | Attending: General Surgery | Admitting: General Surgery

## 2018-01-17 DIAGNOSIS — K219 Gastro-esophageal reflux disease without esophagitis: Secondary | ICD-10-CM | POA: Diagnosis not present

## 2018-01-17 DIAGNOSIS — K5730 Diverticulosis of large intestine without perforation or abscess without bleeding: Secondary | ICD-10-CM | POA: Insufficient documentation

## 2018-01-17 DIAGNOSIS — E782 Mixed hyperlipidemia: Secondary | ICD-10-CM | POA: Diagnosis not present

## 2018-01-17 DIAGNOSIS — Z79899 Other long term (current) drug therapy: Secondary | ICD-10-CM | POA: Insufficient documentation

## 2018-01-17 DIAGNOSIS — I10 Essential (primary) hypertension: Secondary | ICD-10-CM | POA: Diagnosis not present

## 2018-01-17 DIAGNOSIS — Z1211 Encounter for screening for malignant neoplasm of colon: Secondary | ICD-10-CM | POA: Insufficient documentation

## 2018-01-17 DIAGNOSIS — Z8249 Family history of ischemic heart disease and other diseases of the circulatory system: Secondary | ICD-10-CM | POA: Diagnosis not present

## 2018-01-17 HISTORY — DX: Essential (primary) hypertension: I10

## 2018-01-17 HISTORY — PX: COLONOSCOPY WITH PROPOFOL: SHX5780

## 2018-01-17 SURGERY — COLONOSCOPY WITH PROPOFOL
Anesthesia: General

## 2018-01-17 MED ORDER — PROPOFOL 10 MG/ML IV BOLUS
INTRAVENOUS | Status: DC | PRN
  Administered 2018-01-17 (×2): 50 mg via INTRAVENOUS

## 2018-01-17 MED ORDER — PROPOFOL 10 MG/ML IV BOLUS
INTRAVENOUS | Status: AC
  Filled 2018-01-17: qty 20

## 2018-01-17 MED ORDER — SODIUM CHLORIDE 0.9 % IV SOLN
INTRAVENOUS | Status: DC
  Administered 2018-01-17 (×2): via INTRAVENOUS

## 2018-01-17 MED ORDER — PROPOFOL 500 MG/50ML IV EMUL
INTRAVENOUS | Status: AC
  Filled 2018-01-17: qty 50

## 2018-01-17 MED ORDER — PROPOFOL 500 MG/50ML IV EMUL
INTRAVENOUS | Status: DC | PRN
  Administered 2018-01-17: 80 ug/kg/min via INTRAVENOUS

## 2018-01-17 NOTE — Transfer of Care (Signed)
Immediate Anesthesia Transfer of Care Note  Patient: Tyler Peters  Procedure(s) Performed: COLONOSCOPY WITH PROPOFOL (N/A )  Patient Location: PACU  Anesthesia Type:General  Level of Consciousness: awake  Airway & Oxygen Therapy: Patient Spontanous Breathing  Post-op Assessment: Report given to RN  Post vital signs: stable  Last Vitals:  Vitals Value Taken Time  BP    Temp    Pulse    Resp    SpO2      Last Pain:  Vitals:   01/17/18 1032  TempSrc: Tympanic  PainSc: 0-No pain         Complications: No apparent anesthesia complications

## 2018-01-17 NOTE — Anesthesia Preprocedure Evaluation (Signed)
Anesthesia Evaluation  Patient identified by MRN, date of birth, ID band Patient awake    Reviewed: Allergy & Precautions, H&P , NPO status , Patient's Chart, lab work & pertinent test results, reviewed documented beta blocker date and time   History of Anesthesia Complications Negative for: history of anesthetic complications  Airway Mallampati: II  TM Distance: >3 FB Neck ROM: full    Dental  (+) Dental Advidsory Given, Teeth Intact, Caps   Pulmonary neg pulmonary ROS,           Cardiovascular Exercise Tolerance: Good hypertension, (-) angina(-) CAD, (-) Past MI, (-) Cardiac Stents and (-) CABG (-) dysrhythmias (-) Valvular Problems/Murmurs     Neuro/Psych negative neurological ROS  negative psych ROS   GI/Hepatic Neg liver ROS, GERD  ,  Endo/Other  negative endocrine ROS  Renal/GU negative Renal ROS  negative genitourinary   Musculoskeletal   Abdominal   Peds  Hematology negative hematology ROS (+)   Anesthesia Other Findings Past Medical History: No date: Episodic recurrent vertigo     Comment:  evaluated by Dr. Tami Ribas with normal MRI No date: Episodic recurrent vertigo     Comment:  normal MRI  Fall 2008: History of acute prostatitis No date: Hyperlipidemia, mixed 2008: Testicular pain     Comment:  prostatis, treated with Flomax by Dr. Jacqlyn Larsen   Reproductive/Obstetrics negative OB ROS                             Anesthesia Physical Anesthesia Plan  ASA: II  Anesthesia Plan: General   Post-op Pain Management:    Induction: Intravenous  PONV Risk Score and Plan: 2 and Propofol infusion  Airway Management Planned: Nasal Cannula  Additional Equipment:   Intra-op Plan:   Post-operative Plan:   Informed Consent: I have reviewed the patients History and Physical, chart, labs and discussed the procedure including the risks, benefits and alternatives for the proposed  anesthesia with the patient or authorized representative who has indicated his/her understanding and acceptance.   Dental Advisory Given  Plan Discussed with: Anesthesiologist, CRNA and Surgeon  Anesthesia Plan Comments:         Anesthesia Quick Evaluation

## 2018-01-17 NOTE — Anesthesia Post-op Follow-up Note (Signed)
Anesthesia QCDR form completed.        

## 2018-01-17 NOTE — H&P (Signed)
No change in clinical history or exam. Tolerated prep well.  

## 2018-01-17 NOTE — Op Note (Signed)
[facility] Patient Name: Tyler Peters Procedure Date: 01/17/2018 10:38 AM MRN: [id number] Account #: [id number] Date of Birth: 10-06-1960 Admit Type: Outpatient Age: 57 Room: [facility] ENDO ROOM 1 Gender: Male Note Status: Finalized Procedure:            Colonoscopy Indications:          Screening for colorectal malignant neoplasm Providers:            Robert Bellow, MD Referring MD:         Deborra Medina, MD (Referring MD) Medicines:            Monitored Anesthesia Care Complications:        No immediate complications. Procedure:            Pre-Anesthesia Assessment:                       - Prior to the procedure, a History and Physical was                        performed, and patient medications, allergies and                        sensitivities were reviewed. The patient's tolerance of                        previous anesthesia was reviewed.                       - The risks and benefits of the procedure and the                        sedation options and risks were discussed with the                        patient. All questions were answered and informed                        consent was obtained.                       After obtaining informed consent, the colonoscope was                        passed under direct vision. Throughout the procedure,                        the patient's blood pressure, pulse, and oxygen                        saturations were monitored continuously. The                        Colonoscope was introduced through the anus and                        advanced to the the cecum, identified by appendiceal                        orifice and ileocecal valve. The colonoscopy was  performed without difficulty. The patient tolerated the                        procedure well. The quality of the bowel preparation                        was excellent. Findings:      A few small-mouthed diverticula  were found in the sigmoid colon, mid       sigmoid colon and mid ascending colon.      The retroflexed view of the distal rectum and anal verge was normal and       showed no anal or rectal abnormalities. Impression:           - Diverticulosis in the sigmoid colon, in the mid                        sigmoid colon and in the mid ascending colon.                       - The distal rectum and anal verge are normal on                        retroflexion view.                       - No specimens collected. Recommendation:       - Use fiber, for example Citrucel, Fibercon, Konsyl or                        Metamucil.                       - Repeat colonoscopy in 10 years for screening purposes. Procedure Code(s):    --- Professional ---                       [id number], Colonoscopy, flexible; diagnostic, including                        collection of specimen(s) by brushing or washing, when                        performed (separate procedure) Diagnosis Code(s):    --- Professional ---                       Z12.11, Encounter for screening for malignant neoplasm                        of colon                       K57.30, Diverticulosis of large intestine without                        perforation or abscess without bleeding CPT copyright 2017 American Medical Association. All rights reserved. The codes documented in this report are preliminary and upon coder review may  be revised to meet current compliance requirements. Robert Bellow, MD 01/17/2018 11:13:33 AM This report has been signed electronically. Number of Addenda: 0 Note Initiated On: 01/17/2018 10:38 AM Scope Withdrawal Time: 0 hours 7 minutes 26 seconds  Total Procedure Duration: 0 hours 12 minutes 38 seconds       [facility]

## 2018-01-18 ENCOUNTER — Encounter: Payer: Self-pay | Admitting: General Surgery

## 2018-01-19 NOTE — Anesthesia Postprocedure Evaluation (Signed)
Anesthesia Post Note  Patient: Tyler Peters  Procedure(s) Performed: COLONOSCOPY WITH PROPOFOL (N/A )  Patient location during evaluation: Endoscopy Anesthesia Type: General Level of consciousness: awake and alert Pain management: pain level controlled Vital Signs Assessment: post-procedure vital signs reviewed and stable Respiratory status: spontaneous breathing, nonlabored ventilation, respiratory function stable and patient connected to nasal cannula oxygen Cardiovascular status: blood pressure returned to baseline and stable Postop Assessment: no apparent nausea or vomiting Anesthetic complications: no     Last Vitals:  Vitals:   01/17/18 1116 01/17/18 1127  BP: 119/86 (!) 123/96  Pulse: 86 81  Resp: 17 12  Temp: (!) 36.1 C   SpO2: 97% 98%    Last Pain:  Vitals:   01/17/18 1116  TempSrc: Tympanic  PainSc: 0-No pain                 Martha Clan

## 2018-05-21 ENCOUNTER — Encounter: Payer: Self-pay | Admitting: Internal Medicine

## 2018-05-21 ENCOUNTER — Ambulatory Visit (INDEPENDENT_AMBULATORY_CARE_PROVIDER_SITE_OTHER): Payer: Managed Care, Other (non HMO) | Admitting: Internal Medicine

## 2018-05-21 VITALS — BP 116/78 | HR 91 | Temp 98.2°F | Resp 15 | Ht 68.0 in | Wt 228.2 lb

## 2018-05-21 DIAGNOSIS — I10 Essential (primary) hypertension: Secondary | ICD-10-CM

## 2018-05-21 DIAGNOSIS — E782 Mixed hyperlipidemia: Secondary | ICD-10-CM

## 2018-05-21 DIAGNOSIS — E6609 Other obesity due to excess calories: Secondary | ICD-10-CM | POA: Diagnosis not present

## 2018-05-21 DIAGNOSIS — Z6835 Body mass index (BMI) 35.0-35.9, adult: Secondary | ICD-10-CM | POA: Diagnosis not present

## 2018-05-21 DIAGNOSIS — Z23 Encounter for immunization: Secondary | ICD-10-CM

## 2018-05-21 DIAGNOSIS — Z0000 Encounter for general adult medical examination without abnormal findings: Secondary | ICD-10-CM | POA: Diagnosis not present

## 2018-05-21 DIAGNOSIS — Z125 Encounter for screening for malignant neoplasm of prostate: Secondary | ICD-10-CM | POA: Diagnosis not present

## 2018-05-21 LAB — PSA: PSA: 1.16 ng/mL (ref 0.10–4.00)

## 2018-05-21 LAB — COMPREHENSIVE METABOLIC PANEL
ALT: 15 U/L (ref 0–53)
AST: 15 U/L (ref 0–37)
Albumin: 4.1 g/dL (ref 3.5–5.2)
Alkaline Phosphatase: 52 U/L (ref 39–117)
BUN: 16 mg/dL (ref 6–23)
CALCIUM: 9.3 mg/dL (ref 8.4–10.5)
CHLORIDE: 101 meq/L (ref 96–112)
CO2: 30 mEq/L (ref 19–32)
CREATININE: 1.02 mg/dL (ref 0.40–1.50)
GFR: 80.63 mL/min (ref >60.00)
Glucose, Bld: 96 mg/dL (ref 70–99)
POTASSIUM: 3.8 meq/L (ref 3.5–5.1)
SODIUM: 140 meq/L (ref 135–145)
Total Bilirubin: 0.6 mg/dL (ref 0.2–1.2)
Total Protein: 6.6 g/dL (ref 6.0–8.3)

## 2018-05-21 LAB — TSH: TSH: 1.98 u[IU]/mL (ref 0.35–4.50)

## 2018-05-21 LAB — LIPID PANEL
CHOL/HDL RATIO: 5
Cholesterol: 202 mg/dL — ABNORMAL HIGH (ref 0–200)
HDL: 38.2 mg/dL — ABNORMAL LOW (ref >39.00)
LDL Cholesterol: 127 mg/dL — ABNORMAL HIGH (ref 0–99)
NonHDL: 164.06
TRIGLYCERIDES: 183 mg/dL — AB (ref 0.0–149.0)
VLDL: 36.6 mg/dL (ref 0.0–40.0)

## 2018-05-21 MED ORDER — FAMOTIDINE 20 MG PO TABS: 20.0000 mg | ORAL_TABLET | Freq: Two times a day (BID) | ORAL | 1 refills | Status: AC

## 2018-05-21 NOTE — Assessment & Plan Note (Signed)
Well controlled on current regimen. Renal function stable, no changes today. 

## 2018-05-21 NOTE — Patient Instructions (Signed)

## 2018-05-21 NOTE — Assessment & Plan Note (Signed)
I have addressed  BMI and recommended wt loss of 10% of body weigh over the next 6 months using a low glycemic index diet and regular exercise a minimum of 5 days per week.   

## 2018-05-21 NOTE — Assessment & Plan Note (Signed)
Based on current lipid profile, the risk of clinically significant CAD is 15 % over the next 10 years, using the Framingham risk calculator. Statin therapy will be deferred. .    Lab Results  Component Value Date   CHOL 202 (H) 05/21/2018   HDL 38.20 (L) 05/21/2018   LDLCALC 127 (H) 05/21/2018   LDLDIRECT 141.0 05/18/2017   TRIG 183.0 (H) 05/21/2018   CHOLHDL 5 05/21/2018

## 2018-05-21 NOTE — Assessment & Plan Note (Signed)

## 2018-05-21 NOTE — Progress Notes (Signed)
Patient ID: Tyler Peters, male    DOB: 20-Feb-1961  Age: 57 y.o. MRN: [id number]  The patient is here for annual preventive examination and management of other chronic and acute problems.   The risk factors are reflected in the social history.  The roster of all physicians providing medical care to patient - is listed in the Snapshot section of the chart.  Activities of daily living:  The patient is 100% independent in all ADLs: dressing, toileting, feeding as well as independent mobility  Home safety : The patient has smoke detectors in the home. They wear seatbelts.  There are no firearms at home. There is no violence in the home.   There is no risks for hepatitis, STDs or HIV. There is no   history of blood transfusion. They have no travel history to infectious disease endemic areas of the world.  The patient has seen their dentist in the last six month. They have seen their eye doctor in the last year. They admit to slight hearing difficulty with regard to whispered voices and some television programs.  They have deferred audiologic testing in the last year.  They do not  have excessive sun exposure. Discussed the need for sun protection: hats, long sleeves and use of sunscreen if there is significant sun exposure.   Diet: the importance of a healthy diet is discussed. They do have a healthy diet.  The benefits of regular aerobic exercise were discussed. She walks 4 times per week ,  20 minutes.   Depression screen: there are no signs or vegative symptoms of depression- irritability, change in appetite, anhedonia, sadness/tearfullness.  Cognitive assessment: the patient manages all their financial and personal affairs and is actively engaged. They could relate day,date,year and events; recalled 2/3 objects at 3 minutes; performed clock-face test normally.  The following portions of the patient's history were reviewed and updated as appropriate: allergies, current medications, past family  history, past medical history,  past surgical history, past social history  and problem list.  Visual acuity was not assessed per patient preference since she has regular follow up with her ophthalmologist. Hearing and body mass index were assessed and reviewed.   During the course of the visit the patient was educated and counseled about appropriate screening and preventive services including : fall prevention , diabetes screening, nutrition counseling, colorectal cancer screening, and recommended immunizations.    CC: The primary encounter diagnosis was Hypertension, essential. Diagnoses of Class 2 obesity due to excess calories without serious comorbidity with body mass index (BMI) of 35.0 to 35.9 in adult, Prostate cancer screening, Need for immunization against influenza, Encounter for preventive health examination, and Hyperlipidemia, mixed were also pertinent to this visit.  History Tyler Peters has a past medical history of Episodic recurrent vertigo, Episodic recurrent vertigo, History of acute prostatitis (Fall 2008), Hyperlipidemia, mixed, Hypertension, and Testicular pain (2008).   Tyler Peters has a past surgical history that includes Nasal septum surgery (2004); Lithotripsy; and Colonoscopy with propofol (N/A, 01/17/2018).   Tyler Peters family history includes Diabetes in Tyler Peters maternal grandmother; Heart disease in Tyler Peters father and maternal grandmother.Tyler Peters reports that Tyler Peters has never smoked. Tyler Peters has never used smokeless tobacco. Tyler Peters reports that Tyler Peters does not drink alcohol or use drugs.  Outpatient Medications Prior to Visit  Medication Sig Dispense Refill  . albuterol (PROVENTIL HFA;VENTOLIN HFA) 108 (90 Base) MCG/ACT inhaler Inhale 2 puffs into the lungs every 6 (six) hours as needed for wheezing or shortness of breath. 1 Inhaler 0  .  beclomethasone (QVAR) 40 MCG/ACT inhaler Inhale 2 puffs into the lungs 2 (two) times daily. 1 Inhaler 0  . hydrochlorothiazide (HYDRODIURIL) 25 MG tablet Take 1 tablet (25 mg total) by  mouth daily. 90 tablet 1  . Multiple Vitamins-Minerals (MULTIVITAMIN WITH MINERALS) tablet Take 1 tablet by mouth daily.    . Cholecalciferol (VITAMIN D3) 1000 units CAPS Take 1 capsule by mouth daily.    . polyethylene glycol powder (GLYCOLAX/MIRALAX) powder 255 grams one bottle for colonoscopy prep (Patient not taking: Reported on 01/17/2018) 255 g 0   No facility-administered medications prior to visit.     Review of Systems   Patient denies headache, fevers, malaise, unintentional weight loss, skin rash, eye pain, sinus congestion and sinus pain, sore throat, dysphagia,  hemoptysis , cough, dyspnea, wheezing, chest pain, palpitations, orthopnea, edema, abdominal pain, nausea, melena, diarrhea, constipation, flank pain, dysuria, hematuria, urinary  Frequency, nocturia, numbness, tingling, seizures,  Focal weakness, Loss of consciousness,  Tremor, insomnia, depression, anxiety, and suicidal ideation.      Objective:  BP 116/78 (BP Location: Left Arm, Patient Position: Sitting, Cuff Size: Large)   Pulse 91   Temp 98.2 F (36.8 C) (Oral)   Resp 15   Ht 5\' 8"  (1.727 m)   Wt 228 lb 3.2 oz (103.5 kg)   SpO2 96%   BMI 34.70 kg/m   Physical Exam   General appearance: alert, cooperative and appears stated age Ears: normal TM's and external ear canals both ears Throat: lips, mucosa, and tongue normal; teeth and gums normal Neck: no adenopathy, no carotid bruit, supple, symmetrical, trachea midline and thyroid not enlarged, symmetric, no tenderness/mass/nodules Back: symmetric, no curvature. ROM normal. No CVA tenderness. Lungs: clear to auscultation bilaterally Heart: regular rate and rhythm, S1, S2 normal, no murmur, click, rub or gallop Abdomen: soft, non-tender; bowel sounds normal; no masses,  no organomegaly Pulses: 2+ and symmetric Skin: Skin color, texture, turgor normal. No rashes or lesions Lymph nodes: Cervical, supraclavicular, and axillary nodes normal.    Assessment &  Plan:   Problem List Items Addressed This Visit    Encounter for preventive health examination    Annual comprehensive preventive exam was done as well as an evaluation and management of acute and chronic conditions .  During the course of the visit the patient was educated and counseled about appropriate screening and preventive services including :  diabetes screening, lipid analysis with projected  10 year  risk for CAD , nutrition counseling, prostate and colorectal cancer screening, and recommended immunizations.  Printed recommendations for health maintenance screenings was given.       Hyperlipidemia, mixed    Based on current lipid profile, the risk of clinically significant CAD is 15 % over the next 10 years, using the Framingham risk calculator. Statin therapy will be deferred. .    Lab Results  Component Value Date   CHOL 202 (H) 05/21/2018   HDL 38.20 (L) 05/21/2018   LDLCALC 127 (H) 05/21/2018   LDLDIRECT 141.0 05/18/2017   TRIG 183.0 (H) 05/21/2018   CHOLHDL 5 05/21/2018         Hypertension, essential - Primary    Well controlled on current regimen. Renal function stable, no changes today.      Relevant Orders   Comprehensive metabolic panel (Completed)   Obesity, unspecified    I have addressed  BMI and recommended wt loss of 10% of body weigh over the next 6 months using a low glycemic index diet and regular  exercise a minimum of 5 days per week.        Relevant Orders   Lipid panel (Completed)   TSH (Completed)    Other Visit Diagnoses    Prostate cancer screening       Relevant Orders   PSA (Completed)   Need for immunization against influenza       Relevant Orders   Flu Vaccine QUAD 36+ mos IM (Completed)      I have discontinued Delwin L. Picazo "Loryn"'s Vitamin D3 and polyethylene glycol powder. I am also having him start on famotidine. Additionally, I am having him maintain Tyler Peters multivitamin with minerals, albuterol, beclomethasone, and  hydrochlorothiazide.  Meds ordered this encounter  Medications  . famotidine (PEPCID) 20 MG tablet    Sig: Take 1 tablet (20 mg total) by mouth 2 (two) times daily.    Dispense:  90 tablet    Refill:  1    Medications Discontinued During This Encounter  Medication Reason  . polyethylene glycol powder (GLYCOLAX/MIRALAX) powder Completed Course  . Cholecalciferol (VITAMIN D3) 1000 units CAPS Patient has not taken in last 30 days    Follow-up: Return in about 6 months (around 11/20/2018).   Crecencio Mc, MD

## 2018-07-02 ENCOUNTER — Encounter: Payer: Self-pay | Admitting: Family Medicine

## 2018-07-02 ENCOUNTER — Ambulatory Visit (INDEPENDENT_AMBULATORY_CARE_PROVIDER_SITE_OTHER): Payer: Managed Care, Other (non HMO)

## 2018-07-02 ENCOUNTER — Ambulatory Visit: Payer: Managed Care, Other (non HMO) | Admitting: Family Medicine

## 2018-07-02 ENCOUNTER — Ambulatory Visit: Payer: Self-pay | Admitting: *Deleted

## 2018-07-02 VITALS — BP 138/88 | HR 94 | Temp 98.4°F | Ht 68.0 in | Wt 232.2 lb

## 2018-07-02 DIAGNOSIS — R202 Paresthesia of skin: Secondary | ICD-10-CM

## 2018-07-02 DIAGNOSIS — M25511 Pain in right shoulder: Secondary | ICD-10-CM

## 2018-07-02 MED ORDER — MELOXICAM 7.5 MG PO TABS: 7.5000 mg | ORAL_TABLET | Freq: Every day | ORAL | 0 refills | Status: DC

## 2018-07-02 MED ORDER — METHYLPREDNISOLONE 4 MG PO TBPK: ORAL_TABLET | ORAL | 0 refills | Status: DC

## 2018-07-02 NOTE — Progress Notes (Signed)
Subjective:    Patient ID: Tyler Peters, male    DOB: 1961/06/30, 57 y.o.   MRN: [id number]  HPI  Presents to clinic c/o right arm/shoulder pain for about a week.  Patient denies any trauma or falling that could have injured the right shoulder.  Patient states that he noticed some tingling in his fingers of the right hand, so this alarmed him.  Patient states he is taking ibuprofen and Tylenol without any help.  Patient states the pain is the worst when his arm is hanging down in a dependent position or when he has to use hand to type a computer.  Patient states that the pain feels much better when he raises his arm up above head or he is laying in bed.  Patient denies any neck pain or injury to the neck.    Patient denies any past history of injury to right arm/shoulder/neck; even in his childhood.  Patient has no fever or chills.  Patient Active Problem List   Diagnosis Date Noted  . GERD (gastroesophageal reflux disease) 11/15/2016  . Snoring 11/13/2015  . Vitamin D deficiency 11/13/2015  . Hypertension, essential 06/03/2013  . Obesity, unspecified 06/03/2013  . Hyperlipidemia, mixed   . Episodic recurrent vertigo   . History of acute prostatitis   . Encounter for preventive health examination 01/16/2012   Social History   Tobacco Use  . Smoking status: Never Smoker  . Smokeless tobacco: Never Used  Substance Use Topics  . Alcohol use: No   Review of Systems  Constitutional: Negative for chills, fatigue and fever.  HENT: Negative for congestion, ear pain, sinus pain and sore throat.   Eyes: Negative.   Respiratory: Negative for cough, shortness of breath and wheezing.   Cardiovascular: Negative for chest pain, palpitations and leg swelling.  Gastrointestinal: Negative for abdominal pain, diarrhea, nausea and vomiting.  Genitourinary: Negative for dysuria, frequency and urgency.  Musculoskeletal: +right shoulder/arm pain Skin: Negative for color change, pallor and  rash.  Neurological: Negative for syncope, light-headedness and headaches.  Psychiatric/Behavioral: The patient is not nervous/anxious.       Objective:   Physical Exam  Constitutional: He is oriented to person, place, and time. No distress.  HENT:  Head: Normocephalic and atraumatic.  Eyes: Pupils are equal, round, and reactive to light. Conjunctivae and EOM are normal. No scleral icterus.  Neck: Normal range of motion. Neck supple. No tracheal deviation present.  Cardiovascular: Normal rate, regular rhythm and intact distal pulses.  Pulmonary/Chest: Effort normal. No respiratory distress.  Musculoskeletal: Normal range of motion. He exhibits no edema or deformity.       Right shoulder: He exhibits tenderness.       Arms: Area of tenderness of right shoulder indicated by red circle on diagram.  Range of motion of right shoulder appears completely intact, patient has no issue raising right arm straight up above head, putting arm behind back, reaching arm straight out in front of him.  Patient's grips are equal and strong.  Patient is able to hold both arm straight out in front of him and resist me pushing down without any problems.  Neurological: He is alert and oriented to person, place, and time. No cranial nerve deficit or sensory deficit. He exhibits normal muscle tone.  Able to feel me touching RUE/fingers of right hand  Skin: Skin is warm and dry. No rash noted. No erythema. No pallor.  Psychiatric: He has a normal mood and affect. His behavior is normal.  Nursing note and vitals reviewed.     Vitals:   07/02/18 1404  BP: 138/88  Pulse: 94  Temp: 98.4 F (36.9 C)  SpO2: 95%   Assessment & Plan:   Acute pain of right shoulder - unclear reason for this pain.  We will get x-ray of shoulder to further investigate the joint.  Patient also will take steroid taper to see if this will calm down any sort of inflammation and help reduce pain.  He will also begin Mobic once daily to  help with inflammation and pain as well.  Tingling in extremity-tingling and sensation described in right hand makes me suspect something could possibly be going on in the neck.  We will also get neck x-ray.  I am hoping the steroid taper will help with inflammation and improve and in turn help tingling improve.  Once we have x-ray results we will better be able to determine next step in plan of care.  If pain and tingling do not improve over the next week we can do referral to orthopedic and/or sports medicine for further evaluation and management.

## 2018-07-02 NOTE — Telephone Encounter (Signed)
FYI

## 2018-07-02 NOTE — Telephone Encounter (Signed)
The pt called with complaints of pain in his upper arm/right shoulder; initially it was intermittent and is now constant; he says the on;y thing that helps with the pain is to lay flat; he has taken advil ; he says that the fingers on his right hand are tingling and numb like they are asleep; nurse triage initiated and recommendations made per  Protocol; the pt would like to be seen in the office today; he normally sees Dr Derrel Nip but she has no availability; he is agreeable to seeing another provider; pt offered and accepted appointment with Phil[facility] Nettle, [place], 07/02/18 at 1400; he verbalized understanding; will route to office for notification of this upcoming visit.   Reason for Disposition . [1] MODERATE pain (e.g., interferes with normal activities) AND [2] present > 3 days  Answer Assessment - Initial Assessment Questions 1. ONSET: "When did the pain start?"     06/26/18 2. LOCATION: "Where is the pain located?"     Right upper arm/shouler 3. PAIN: "How bad is the pain?" (Scale 1-10; or mild, moderate, severe)   - MILD (1-3): doesn't interfere with normal activities   - MODERATE (4-7): interferes with normal activities (e.g., work or school) or awakens from sleep   - SEVERE (8-10): excruciating pain, unable to do any normal activities, unable to hold a cup of water      Rated 6-7 out of 10 intermittently 4. WORK OR EXERCISE: "Has there been any recent work or exercise that involved this part of the body?"     no 5. CAUSE: "What do you think is causing the arm pain?"     no 6. OTHER SYMPTOMS: "Do you have any other symptoms?" (e.g., neck pain, swelling, rash, fever, numbness, weakness)     Numbness and tingling in right hand  7. PREGNANCY: "Is there any chance you are pregnant?" "When was your last menstrual period?"     n/a  Protocols used: ARM PAIN-A-AH

## 2018-07-03 ENCOUNTER — Encounter: Payer: Self-pay | Admitting: Family Medicine

## 2018-07-13 DIAGNOSIS — M4722 Other spondylosis with radiculopathy, cervical region: Secondary | ICD-10-CM

## 2018-07-16 MED ORDER — PREDNISONE 10 MG PO TABS: ORAL_TABLET | ORAL | 0 refills | Status: DC

## 2018-07-16 MED ORDER — TRAMADOL HCL 50 MG PO TABS: 50.0000 mg | ORAL_TABLET | Freq: Three times a day (TID) | ORAL | 0 refills | Status: DC | PRN

## 2018-07-18 ENCOUNTER — Other Ambulatory Visit: Payer: Self-pay | Admitting: Internal Medicine

## 2018-07-18 DIAGNOSIS — I10 Essential (primary) hypertension: Secondary | ICD-10-CM

## 2018-07-22 ENCOUNTER — Other Ambulatory Visit: Payer: Self-pay | Admitting: Family Medicine

## 2018-07-22 DIAGNOSIS — R202 Paresthesia of skin: Secondary | ICD-10-CM

## 2018-07-22 DIAGNOSIS — M25511 Pain in right shoulder: Secondary | ICD-10-CM

## 2018-07-23 NOTE — Telephone Encounter (Signed)
Last OV 07/02/2018   Last refilled 07/02/2018 disp 30 with no refills   Sent to [facility] for approval

## 2018-07-30 ENCOUNTER — Telehealth: Payer: Self-pay | Admitting: Internal Medicine

## 2018-08-31 ENCOUNTER — Other Ambulatory Visit: Payer: Self-pay | Admitting: Family Medicine

## 2018-08-31 DIAGNOSIS — M25511 Pain in right shoulder: Secondary | ICD-10-CM

## 2018-08-31 DIAGNOSIS — R202 Paresthesia of skin: Secondary | ICD-10-CM

## 2018-10-25 ENCOUNTER — Other Ambulatory Visit: Payer: Self-pay | Admitting: Internal Medicine

## 2018-10-25 MED ORDER — PREDNISONE 10 MG PO TABS: ORAL_TABLET | ORAL | 0 refills | Status: DC

## 2018-10-25 NOTE — Progress Notes (Signed)
d 

## 2018-11-09 ENCOUNTER — Other Ambulatory Visit: Payer: Self-pay

## 2018-11-12 MED ORDER — PREDNISONE 10 MG PO TABS: ORAL_TABLET | ORAL | 0 refills | Status: DC

## 2018-11-12 NOTE — Telephone Encounter (Signed)
Refilled: 10/25/2018 Last OV: 05/21/2018 Next OV: 11/21/2018

## 2018-11-21 ENCOUNTER — Other Ambulatory Visit: Payer: Self-pay

## 2018-11-21 ENCOUNTER — Ambulatory Visit (INDEPENDENT_AMBULATORY_CARE_PROVIDER_SITE_OTHER): Payer: No Typology Code available for payment source | Admitting: Internal Medicine

## 2018-11-21 DIAGNOSIS — R3911 Hesitancy of micturition: Secondary | ICD-10-CM | POA: Diagnosis not present

## 2018-11-21 DIAGNOSIS — M5412 Radiculopathy, cervical region: Secondary | ICD-10-CM

## 2018-11-21 DIAGNOSIS — I10 Essential (primary) hypertension: Secondary | ICD-10-CM

## 2018-11-21 DIAGNOSIS — L255 Unspecified contact dermatitis due to plants, except food: Secondary | ICD-10-CM

## 2018-11-21 DIAGNOSIS — Z7189 Other specified counseling: Secondary | ICD-10-CM

## 2018-11-21 DIAGNOSIS — Z87438 Personal history of other diseases of male genital organs: Secondary | ICD-10-CM

## 2018-11-21 DIAGNOSIS — R7301 Impaired fasting glucose: Secondary | ICD-10-CM

## 2018-11-21 DIAGNOSIS — E782 Mixed hyperlipidemia: Secondary | ICD-10-CM | POA: Diagnosis not present

## 2018-11-21 DIAGNOSIS — M4692 Unspecified inflammatory spondylopathy, cervical region: Secondary | ICD-10-CM

## 2018-11-21 MED ORDER — PREDNISONE 10 MG PO TABS: ORAL_TABLET | ORAL | 0 refills | Status: DC

## 2018-11-21 MED ORDER — GABAPENTIN 100 MG PO CAPS: 100.0000 mg | ORAL_CAPSULE | Freq: Three times a day (TID) | ORAL | 3 refills | Status: DC

## 2018-11-21 MED ORDER — HYDROCHLOROTHIAZIDE 25 MG PO TABS: 25.0000 mg | ORAL_TABLET | Freq: Every day | ORAL | 1 refills | Status: DC

## 2018-11-21 NOTE — Patient Instructions (Addendum)
I recommend adding a nonsedating antihistamine  Daily for your next flare of poison ivy/sumac and have sent an extended prednisone taper to [facility] (60 mg daily x 5 days,  Then reduce by  10 mg every 2 days until gone)  You should suspend meloxicam use when taking prednisone   I will try to get an MRI of cervical spine approved.    Trial of gabapentin 100 mg at bedtime for the radiculitis.  Ok to combine with tylenol and meloxicam  The urinary hesitancy may be due to an enlarged prostate  Please send me some readings of your blood pressure over the next few weeks.   I would like to repeat your fasting labs in 1-2 weeks (after the prednisone is out of your system).  You can have them done at our office,  Just schedule a lab visit.  The office has not had a sick patient enter the building in over 4 weeks and everybody is wearing masks

## 2018-11-21 NOTE — Progress Notes (Signed)
Virtual Visit via [email or web address]  This visit type was conducted due to national recommendations for restrictions regarding the COVID-19 pandemic (e.g. social distancing).  This format is felt to be most appropriate for this patient at this time.  All issues noted in this document were discussed and addressed.  No physical exam was performed (except for noted visual exam findings with Video Visits).   I connected with@ on 11/21/18 at  8:00 AM EDT by a video enabled telemedicine application and verified that I am speaking with the correct person using two identifiers. Location patient: home Location provider: work or home office Persons participating in the virtual visit: patient, provider  I discussed the limitations, risks, security and privacy concerns of performing an evaluation and management service by telephone and the availability of in person appointments. I also discussed with the patient that there may be a patient responsible charge related to this service. The patient expressed understanding and agreed to proceed.  Reason for visit: follow up on multiple issues  HPI: The patient has no signs or symptoms of COVID 19 infection (fever, cough, sore throat  or shortness of breath beyond what is typical for patient).  Patient denies contact with other persons with the above mentioned symptoms or with anyone confirmed to have COVID 19   1) contact dermatitis finally resolving, on 2nd round of prednisone and concurrent use of topical anti itch cream called Ivarest obtained OTC   2) cervical radiculitis ,  Right sided.  Recurrent , previous episode lasted all of December   The pain continues to radiate to his right inde finger .Marland Kitchen  MRI was ordered but insurance would not approve the procedure.  He has changed insurance since then . He did not tolerate tramadol due to nausea.   He is now wanting to pursue  the  MRI due to recurrent debilitating pain..  Discussed trial of gabapentin   3) one week of  urinary hesitancy,  Feels it is getting  better . Denies dysuria and nocturia  4)Hypertension: patient checks blood pressure twice weekly at home.  Readings have been for the most part > 140/80 at rest . Patient is following a reduce salt diet most days and is taking medications as prescribed    ROS: See pertinent positives and negatives per HPI.  Past Medical History:  Diagnosis Date  . Episodic recurrent vertigo    evaluated by Dr. Tami Ribas with normal MRI  . Episodic recurrent vertigo    normal MRI   . History of acute prostatitis Fall 2008  . Hyperlipidemia, mixed   . Hypertension   . Testicular pain 2008   prostatis, treated with Flomax by Dr. Jacqlyn Larsen    Past Surgical History:  Procedure Laterality Date  . COLONOSCOPY WITH PROPOFOL N/A 01/17/2018   Procedure: COLONOSCOPY WITH PROPOFOL;  Surgeon: Robert Bellow, MD;  Location: [facility] ENDOSCOPY;  Service: Endoscopy;  Laterality: N/A;  . LITHOTRIPSY    . NASAL SEPTUM SURGERY  2004    Family History  Problem Relation Age of Onset  . Heart disease Father   . Heart disease Maternal Grandmother   . Diabetes Maternal Grandmother     SOCIAL HX: married  No alcohol or tobacco . Volunteers with [employer].  Working remotely and in [employer]   Current Outpatient Medications:  .  albuterol (PROVENTIL HFA;VENTOLIN HFA) 108 (90 Base) MCG/ACT inhaler, Inhale 2 puffs into the lungs every 6 (six) hours as needed for wheezing or shortness of breath., Disp:  1 Inhaler, Rfl: 0 .  beclomethasone (QVAR) 40 MCG/ACT inhaler, Inhale 2 puffs into the lungs 2 (two) times daily., Disp: 1 Inhaler, Rfl: 0 .  famotidine (PEPCID) 20 MG tablet, Take 1 tablet (20 mg total) by mouth 2 (two) times daily., Disp: 90 tablet, Rfl: 1 .  hydrochlorothiazide (HYDRODIURIL) 25 MG tablet, Take 1 tablet (25 mg total) by mouth daily., Disp: 90 tablet, Rfl: 1 .  meloxicam (MOBIC) 7.5 MG tablet, TAKE 1 TABLET (7.5 MG TOTAL) BY MOUTH DAILY. TAKE WITH FOOD., Disp: 30  tablet, Rfl: 0 .  Multiple Vitamins-Minerals (MULTIVITAMIN WITH MINERALS) tablet, Take 1 tablet by mouth daily., Disp: , Rfl:  .  predniSONE (DELTASONE) 10 MG tablet, 6 tablets daily on Days 1 and 2,  then reduce by 1 tablet every 2 days  until gone, Disp: 42 tablet, Rfl: 0 .  gabapentin (NEURONTIN) 100 MG capsule, Take 1 capsule (100 mg total) by mouth 3 (three) times daily., Disp: 90 capsule, Rfl: 3 .  predniSONE (DELTASONE) 10 MG tablet, 6 tablets  Daily for 5 days ,  Then reduce by 1 tablet every 2 days until gone, Disp: 60 tablet, Rfl: 0 .  traMADol (ULTRAM) 50 MG tablet, Take 1 tablet (50 mg total) by mouth every 8 (eight) hours as needed. (Patient not taking: Reported on 11/21/2018), Disp: 30 tablet, Rfl: 0  EXAM:  VITALS per patient if applicable:  GENERAL: alert, oriented, appears well and in no acute distress  HEENT: atraumatic, conjunttiva clear, no obvious abnormalities on inspection of external nose and ears  NECK: normal movements of the head and neck  LUNGS: on inspection no signs of respiratory distress, breathing rate appears normal, no obvious gross SOB, gasping or wheezing  CV: no obvious cyanosis  MS: moves all visible extremities without noticeable abnormality  PSYCH/NEURO: pleasant and cooperative, no obvious depression or anxiety, speech and thought processing grossly intact  ASSESSMENT AND PLAN:  Discussed the following assessment and plan:  Hyperlipidemia, mixed - Plan: Lipid panel  Hypertension, essential - Plan: hydrochlorothiazide (HYDRODIURIL) 25 MG tablet, Comprehensive metabolic panel  Impaired fasting glucose - Plan: Hemoglobin A1c  Urinary hesitancy - Plan: Urinalysis, Routine w reflex microscopic, Urine Culture  History of acute prostatitis  Cervical spondylitis with radiculitis (HCC)  Educated About Covid-19 Virus Infection  Contact dermatitis due to plant  History of acute prostatitis Current symptoms of urinary hesitancy are improving  . Advised to submit a urine for testing,   Hypertension, essential Well controlled on hctz only.  Renal function due, no changes today.  Hyperlipidemia, mixed Based on last  lipid profile, the risk of clinically significant CAD is 15 % over the next 10 years, using the Framingham risk calculator. Statin therapy was offered but deferred. .    Lab Results  Component Value Date   CHOL 202 (H) 05/21/2018   HDL 38.20 (L) 05/21/2018   LDLCALC 127 (H) 05/21/2018   LDLDIRECT 141.0 05/18/2017   TRIG 183.0 (H) 05/21/2018   CHOLHDL 5 05/21/2018     Cervical spondylitis with radiculitis ([place]) Suggested by history of DDD with bone spurring and   pain radiating to index finger on the right MRI ordered. . Adding gabapentin   Educated About Covid-19 Virus Infection Educated patient on the signs and symptoms of COVID-19 infection and ways to avoid the viral infection including washing hands frequently with soap and water,  using hand sanitizer if unable to wash, avoiding touching face,  staying at home and limiting visitors,  and  avoiding contact with people coming in and out of home.  Reminded patient to call office with questions/concerns.  The importance of social distancing was discussed today  Contact dermatitis due to plant Advised to add a nonsedating antihistamine for next episode.  Extended prednisone taper written     I discussed the assessment and treatment plan with the patient. The patient was provided an opportunity to ask questions and all were answered. The patient agreed with the plan and demonstrated an understanding of the instructions.   The patient was advised to call back or seek an in-person evaluation if the symptoms worsen or if the condition fails to improve as anticipated.  I provided  25 minutes of non-face-to-face time during this encounter.   Crecencio Mc, MD

## 2018-11-24 DIAGNOSIS — Z7189 Other specified counseling: Secondary | ICD-10-CM | POA: Insufficient documentation

## 2018-11-24 DIAGNOSIS — M5412 Radiculopathy, cervical region: Secondary | ICD-10-CM

## 2018-11-24 DIAGNOSIS — L255 Unspecified contact dermatitis due to plants, except food: Secondary | ICD-10-CM | POA: Insufficient documentation

## 2018-11-24 DIAGNOSIS — M4692 Unspecified inflammatory spondylopathy, cervical region: Secondary | ICD-10-CM | POA: Insufficient documentation

## 2018-11-24 NOTE — Assessment & Plan Note (Signed)
Current symptoms of urinary hesitancy are improving . Advised to submit a urine for testing,

## 2018-11-24 NOTE — Assessment & Plan Note (Addendum)
Suggested by history of DDD with bone spurring and   pain radiating to index finger on the right MRI ordered. . Adding gabapentin

## 2018-11-24 NOTE — Assessment & Plan Note (Signed)
Advised to add a nonsedating antihistamine for next episode.  Extended prednisone taper written

## 2018-11-24 NOTE — Assessment & Plan Note (Signed)
Educated patient on the signs and symptoms of COVID-19 infection and ways to avoid the viral infection including washing hands frequently with soap and water,  using hand sanitizer if unable to wash, avoiding touching face,  staying at home and limiting visitors,  and avoiding contact with people coming in and out of home.  Reminded patient to call office with questions/concerns.  The importance of social distancing was discussed today 

## 2018-11-24 NOTE — Assessment & Plan Note (Signed)
Based on last  lipid profile, the risk of clinically significant CAD is 15 % over the next 10 years, using the Framingham risk calculator. Statin therapy was offered but deferred. .    Lab Results  Component Value Date   CHOL 202 (H) 05/21/2018   HDL 38.20 (L) 05/21/2018   LDLCALC 127 (H) 05/21/2018   LDLDIRECT 141.0 05/18/2017   TRIG 183.0 (H) 05/21/2018   CHOLHDL 5 05/21/2018

## 2018-11-24 NOTE — Assessment & Plan Note (Signed)
Well controlled on hctz only.  Renal function due, no changes today.

## 2019-01-10 ENCOUNTER — Other Ambulatory Visit: Payer: Self-pay

## 2019-01-10 ENCOUNTER — Ambulatory Visit
Admission: RE | Admit: 2019-01-10 | Discharge: 2019-01-10 | Disposition: A | Discharge status: Home / Self Care | Payer: PRIVATE HEALTH INSURANCE | Source: Ambulatory Visit | Attending: Internal Medicine | Admitting: Internal Medicine

## 2019-01-10 DIAGNOSIS — M4692 Unspecified inflammatory spondylopathy, cervical region: Secondary | ICD-10-CM | POA: Insufficient documentation

## 2019-01-10 DIAGNOSIS — M5412 Radiculopathy, cervical region: Secondary | ICD-10-CM | POA: Diagnosis present

## 2019-05-17 ENCOUNTER — Other Ambulatory Visit: Payer: Self-pay | Admitting: Internal Medicine

## 2019-05-17 DIAGNOSIS — I10 Essential (primary) hypertension: Secondary | ICD-10-CM

## 2019-05-27 ENCOUNTER — Other Ambulatory Visit: Payer: Self-pay

## 2019-05-29 ENCOUNTER — Encounter: Payer: Self-pay | Admitting: Internal Medicine

## 2019-05-29 ENCOUNTER — Ambulatory Visit (INDEPENDENT_AMBULATORY_CARE_PROVIDER_SITE_OTHER): Payer: No Typology Code available for payment source | Admitting: Internal Medicine

## 2019-05-29 ENCOUNTER — Other Ambulatory Visit: Payer: Self-pay

## 2019-05-29 ENCOUNTER — Encounter: Payer: No Typology Code available for payment source | Admitting: Internal Medicine

## 2019-05-29 VITALS — BP 124/86 | HR 96 | Temp 97.9°F | Resp 15 | Ht 68.0 in | Wt 224.8 lb

## 2019-05-29 DIAGNOSIS — E782 Mixed hyperlipidemia: Secondary | ICD-10-CM | POA: Diagnosis not present

## 2019-05-29 DIAGNOSIS — Z0000 Encounter for general adult medical examination without abnormal findings: Secondary | ICD-10-CM

## 2019-05-29 DIAGNOSIS — E559 Vitamin D deficiency, unspecified: Secondary | ICD-10-CM

## 2019-05-29 DIAGNOSIS — M4692 Unspecified inflammatory spondylopathy, cervical region: Secondary | ICD-10-CM

## 2019-05-29 DIAGNOSIS — Z125 Encounter for screening for malignant neoplasm of prostate: Secondary | ICD-10-CM | POA: Diagnosis not present

## 2019-05-29 DIAGNOSIS — I10 Essential (primary) hypertension: Secondary | ICD-10-CM

## 2019-05-29 DIAGNOSIS — Z23 Encounter for immunization: Secondary | ICD-10-CM

## 2019-05-29 DIAGNOSIS — L255 Unspecified contact dermatitis due to plants, except food: Secondary | ICD-10-CM

## 2019-05-29 DIAGNOSIS — M5412 Radiculopathy, cervical region: Secondary | ICD-10-CM

## 2019-05-29 MED ORDER — PREDNISONE 10 MG PO TABS: ORAL_TABLET | ORAL | 0 refills | Status: DC

## 2019-05-29 NOTE — Patient Instructions (Signed)
I RECOMMEND physical therapy for the neck to prevent  More degenerative changes caused by work posture  [facility] would be my first choice (contact info attached)   Health Maintenance, Male Adopting a healthy lifestyle and getting preventive care are important in promoting health and wellness. Ask your health care provider about:  The right schedule for you to have regular tests and exams.  Things you can do on your own to prevent diseases and keep yourself healthy. What should I know about diet, weight, and exercise? Eat a healthy diet   Eat a diet that includes plenty of vegetables, fruits, low-fat dairy products, and lean protein.  Do not eat a lot of foods that are high in solid fats, added sugars, or sodium. Maintain a healthy weight Body mass index (BMI) is a measurement that can be used to identify possible weight problems. It estimates body fat based on height and weight. Your health care provider can help determine your BMI and help you achieve or maintain a healthy weight. Get regular exercise Get regular exercise. This is one of the most important things you can do for your health. Most adults should:  Exercise for at least 150 minutes each week. The exercise should increase your heart rate and make you sweat (moderate-intensity exercise).  Do strengthening exercises at least twice a week. This is in addition to the moderate-intensity exercise.  Spend less time sitting. Even light physical activity can be beneficial. Watch cholesterol and blood lipids Have your blood tested for lipids and cholesterol at 56 years of age, then have this test every 5 years. You may need to have your cholesterol levels checked more often if:  Your lipid or cholesterol levels are high.  You are older than 56 years of age.  You are at high risk for heart disease. What should I know about cancer screening? Many types of cancers can be detected early and may often be prevented. Depending  on your health history and family history, you may need to have cancer screening at various ages. This may include screening for:  Colorectal cancer.  Prostate cancer.  Skin cancer.  Lung cancer. What should I know about heart disease, diabetes, and high blood pressure? Blood pressure and heart disease  High blood pressure causes heart disease and increases the risk of stroke. This is more likely to develop in people who have high blood pressure readings, are of African descent, or are overweight.  Talk with your health care provider about your target blood pressure readings.  Have your blood pressure checked: ? Every 3-5 years if you are 59-17 years of age. ? Every year if you are 83 years old or older.  If you are between the ages of 20 and 10 and are a current or former smoker, ask your health care provider if you should have a one-time screening for abdominal aortic aneurysm (AAA). Diabetes Have regular diabetes screenings. This checks your fasting blood sugar level. Have the screening done:  Once every three years after age 54 if you are at a normal weight and have a low risk for diabetes.  More often and at a younger age if you are overweight or have a high risk for diabetes. What should I know about preventing infection? Hepatitis B If you have a higher risk for hepatitis B, you should be screened for this virus. Talk with your health care provider to find out if you are at risk for hepatitis B infection. Hepatitis C Blood testing  is recommended for:  Everyone born from [birth year] through 1963.  Anyone with known risk factors for hepatitis C. Sexually transmitted infections (STIs)  You should be screened each year for STIs, including gonorrhea and chlamydia, if: ? You are sexually active and are younger than 56 years of age. ? You are older than 56 years of age and your health care provider tells you that you are at risk for this type of infection. ? Your sexual activity has  changed since you were last screened, and you are at increased risk for chlamydia or gonorrhea. Ask your health care provider if you are at risk.  Ask your health care provider about whether you are at high risk for HIV. Your health care provider may recommend a prescription medicine to help prevent HIV infection. If you choose to take medicine to prevent HIV, you should first get tested for HIV. You should then be tested every 3 months for as long as you are taking the medicine. Follow these instructions at home: Lifestyle  Do not use any products that contain nicotine or tobacco, such as cigarettes, e-cigarettes, and chewing tobacco. If you need help quitting, ask your health care provider.  Do not use street drugs.  Do not share needles.  Ask your health care provider for help if you need support or information about quitting drugs. Alcohol use  Do not drink alcohol if your health care provider tells you not to drink.  If you drink alcohol: ? Limit how much you have to 0-2 drinks a day. ? Be aware of how much alcohol is in your drink. In the [place] one drink equals one 12 oz bottle of beer (355 mL), one 5 oz glass of wine (148 mL), or one 1 oz glass of hard liquor (44 mL). General instructions  Schedule regular health, dental, and eye exams.  Stay current with your vaccines.  Tell your health care provider if: ? You often feel depressed. ? You have ever been abused or do not feel safe at home. Summary  Adopting a healthy lifestyle and getting preventive care are important in promoting health and wellness.  Follow your health care provider's instructions about healthy diet, exercising, and getting tested or screened for diseases.  Follow your health care provider's instructions on monitoring your cholesterol and blood pressure. This information is not intended to replace advice given to you by your health care provider. Make sure you discuss any questions you have with your  health care provider. Document Released: 01/14/2008 Document Revised: 07/11/2018 Document Reviewed: 07/11/2018 Elsevier Patient Education  2020 [employer].

## 2019-05-29 NOTE — Progress Notes (Signed)
Patient ID: Tyler Peters, male    DOB: [birth year]  Age: 58 y.o. MRN: [id number]  The patient is here for annual PREVENTIVE  examination and management of other chronic and acute problems.  COLON 2019 PSA May 21 2018    The risk factors are reflected in the social history.  The roster of all physicians providing medical care to patient - is listed in the Snapshot section of the chart.  Activities of daily living:  The patient is 100% independent in all ADLs: dressing, toileting, feeding as well as independent mobility  Home safety : The patient has smoke detectors in the home. They wear seatbelts.  There are no firearms at home. There is no violence in the home.   There is no risks for hepatitis, STDs or HIV. There is no   history of blood transfusion. They have no travel history to infectious disease endemic areas of the world.  The patient has seen their dentist in the last six month. They have seen their eye doctor in the last year. They admit to slight hearing difficulty with regard to whispered voices and some television programs.  They have deferred audiologic testing in the last year.  They do not  have excessive sun exposure. Discussed the need for sun protection: hats, long sleeves and use of sunscreen if there is significant sun exposure.   Diet: the importance of a healthy diet is discussed. They do have a healthy diet.  The benefits of regular aerobic exercise were discussed. She walks 4 times per week ,  20 minutes.   Depression screen: there are no signs or vegative symptoms of depression- irritability, change in appetite, anhedonia, sadness/tearfullness.  Cognitive assessment: the patient manages all their financial and personal affairs and is actively engaged. They could relate day,date,year and events; recalled 2/3 objects at 3 minutes; performed clock-face test normally.  The following portions of the patient's history were reviewed and updated as appropriate: allergies,  current medications, past family history, past medical history,  past surgical history, past social history  and problem list.  Visual acuity was not assessed per patient preference since she has regular follow up with her ophthalmologist. Hearing and body mass index were assessed and reviewed.   During the course of the visit the patient was educated and counseled about appropriate screening and preventive services including : fall prevention , diabetes screening, nutrition counseling, colorectal cancer screening, and recommended immunizations.    CC: The primary encounter diagnosis was Hyperlipidemia, mixed. 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  HISTORY OF NECK PAIN,  MRI CERVICAL SPINE DEC 2019 . NO RECURRENCE OF NECK OR ARM PAIN SINCE April BUT FIRST INDEX FINGER HAS BEEN NUMB FOR NEARLY A YEAR   History Tyler Peters has a past medical history of Episodic recurrent vertigo, Episodic recurrent vertigo, History of acute prostatitis (Fall 2008), Hyperlipidemia, mixed, Hypertension, and Testicular pain (2008).   He has a past surgical history that includes Nasal septum surgery (2004); Lithotripsy; and Colonoscopy with propofol (N/A, 01/17/2018).   His family history includes Diabetes in his maternal grandmother; Heart disease in his father and maternal grandmother.He reports that he has never smoked. He has never used smokeless tobacco. He reports that he does not drink alcohol or use drugs.  Outpatient Medications Prior to Visit  Medication Sig Dispense  Refill  . albuterol (PROVENTIL HFA;VENTOLIN HFA) 108 (90 Base) MCG/ACT inhaler Inhale 2 puffs into the lungs every 6 (six) hours as needed for wheezing or shortness of breath. 1 Inhaler 0  . beclomethasone  (QVAR) 40 MCG/ACT inhaler Inhale 2 puffs into the lungs 2 (two) times daily. 1 Inhaler 0  . famotidine (PEPCID) 20 MG tablet Take 1 tablet (20 mg total) by mouth 2 (two) times daily. 90 tablet 1  . gabapentin (NEURONTIN) 100 MG capsule Take 1 capsule (100 mg total) by mouth 3 (three) times daily. 90 capsule 3  . hydrochlorothiazide (HYDRODIURIL) 25 MG tablet TAKE 1 TABLET BY MOUTH EVERY DAY 90 tablet 1  . meloxicam (MOBIC) 7.5 MG tablet TAKE 1 TABLET (7.5 MG TOTAL) BY MOUTH DAILY. TAKE WITH FOOD. 30 tablet 0  . Multiple Vitamins-Minerals (MULTIVITAMIN WITH MINERALS) tablet Take 1 tablet by mouth daily.    . predniSONE (DELTASONE) 10 MG tablet 6 tablets daily on Days 1 and 2,  then reduce by 1 tablet every 2 days  until gone 42 tablet 0  . predniSONE (DELTASONE) 10 MG tablet 6 tablets  Daily for 5 days ,  Then reduce by 1 tablet every 2 days until gone (Patient not taking: Reported on 05/29/2019) 60 tablet 0  . traMADol (ULTRAM) 50 MG tablet Take 1 tablet (50 mg total) by mouth every 8 (eight) hours as needed. (Patient not taking: Reported on 11/21/2018) 30 tablet 0   No facility-administered medications prior to visit.     Review of Systems  Patient denies headache, fevers, malaise, unintentional weight loss, skin rash, eye pain, sinus congestion and sinus pain, sore throat, dysphagia,  hemoptysis , cough, dyspnea, wheezing, chest pain, palpitations, orthopnea, edema, abdominal pain, nausea, melena, diarrhea, constipation, flank pain, dysuria, hematuria, urinary  Frequency, nocturia, numbness, tingling, seizures,  Focal weakness, Loss of consciousness,  Tremor, insomnia, depression, anxiety, and suicidal ideation.     Objective:  BP 124/86 (BP Location: Left Arm, Patient Position: Sitting, Cuff Size: Normal)   Pulse 96   Temp 97.9 F (36.6 C) (Temporal)   Resp 15   Ht 5\' 8"  (1.727 m)   Wt 224 lb 12.8 oz (102 kg)   SpO2 96%   BMI 34.18 kg/m   Physical Exam  General appearance: alert,  cooperative and appears stated age Ears: normal TM's and external ear canals both ears Throat: lips, mucosa, and tongue normal; teeth and gums normal Neck: no adenopathy, no carotid bruit, supple, symmetrical, trachea midline and thyroid not enlarged, symmetric, no tenderness/mass/nodules Back: symmetric, no curvature. ROM normal. No CVA tenderness. Lungs: clear to auscultation bilaterally Heart: regular rate and rhythm, S1, S2 normal, no murmur, click, rub or gallop Abdomen: soft, non-tender; bowel sounds normal; no masses,  no organomegaly Pulses: 2+ and symmetric Skin: Skin color, texture, turgor normal. No rashes or lesions Lymph nodes: Cervical, supraclavicular, and axillary nodes normal.   Assessment & Plan:   Problem List Items Addressed This Visit      Unprioritized   Encounter for preventive health examination    age appropriate education and counseling updated, referrals for preventative services and immunizations addressed, dietary and smoking counseling addressed, most recent labs reviewed.  I have personally reviewed and have noted:  1) the patient's medical and social history 2) The pt's use of alcohol, tobacco, and illicit drugs 3) The patient's current medications and supplements 4) Functional ability including ADL's, fall risk, home safety risk, hearing and visual impairment 5) Diet and physical activities 6)  Evidence for depression or mood disorder 7) The patient's height, weight, and BMI have been recorded in the chart  I have made referrals, and provided counseling and education based on review of the above      Hyperlipidemia, mixed - Primary    Based on last  lipid profile, the risk of clinically significant CAD is 15 % over the next 10 years, using the Framingham risk calculator. Statin therapy was offered but deferred in 2019.  Repeat lipids are due . Marland Kitchen    Lab Results  Component Value Date   CHOL 202 (H) 05/21/2018   HDL 38.20 (L) 05/21/2018   LDLCALC  127 (H) 05/21/2018   LDLDIRECT 141.0 05/18/2017   TRIG 183.0 (H) 05/21/2018   CHOLHDL 5 05/21/2018         Relevant Orders   Lipid panel   Hypertension, essential    Well controlled on current regimen. Renal function is due  Lab Results  Component Value Date   CREATININE 1.02 05/21/2018         Relevant Orders   Comprehensive metabolic panel   Cervical spondylitis with radiculitis ([facility])    All symptoms resolved except numbness of index finger.  MRI reviewed with patient  . 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  Refilled prednisone       Vitamin D deficiency   Relevant Orders   VITAMIN D 25 Hydroxy (Vit-D Deficiency, Fractures)    Other Visit Diagnoses    Need for immunization against influenza       Relevant Orders   Flu Vaccine QUAD 36+ mos IM (Completed)   Prostate cancer screening       Relevant Orders   PSA      I have discontinued Aki L. Stepney "Loryn"'s traMADol. I am also having him maintain his multivitamin with minerals, albuterol, beclomethasone, famotidine, meloxicam, gabapentin, hydrochlorothiazide, and predniSONE.  Meds ordered this encounter  Medications  . predniSONE (DELTASONE) 10 MG tablet    Sig: 6 tablets  Daily for 5 days ,  Then reduce by 1 tablet every 2 days until gone    Dispense:  60 tablet    Refill:  0    Medications Discontinued During This Encounter  Medication Reason  . traMADol (ULTRAM) 50 MG tablet Patient Preference  . predniSONE (DELTASONE) 10 MG tablet Completed Course  . predniSONE (DELTASONE) 10 MG tablet Completed Course    Follow-up: Return in about 6 months (around 11/27/2019).   Crecencio Mc, MD

## 2019-05-30 LAB — COMPREHENSIVE METABOLIC PANEL
ALT: 15 U/L (ref 0–53)
AST: 15 U/L (ref 0–37)
Albumin: 4.5 g/dL (ref 3.5–5.2)
Alkaline Phosphatase: 62 U/L (ref 39–117)
BUN: 17 mg/dL (ref 6–23)
CO2: 28 mEq/L (ref 19–32)
Calcium: 9.9 mg/dL (ref 8.4–10.5)
Chloride: 100 mEq/L (ref 96–112)
Creatinine, Ser: 1 mg/dL (ref 0.40–1.50)
GFR: 77.33 mL/min (ref >60.00)
Glucose, Bld: 85 mg/dL (ref 70–99)
Potassium: 3.9 mEq/L (ref 3.5–5.1)
Sodium: 139 mEq/L (ref 135–145)
Total Bilirubin: 0.8 mg/dL (ref 0.2–1.2)
Total Protein: 6.9 g/dL (ref 6.0–8.3)

## 2019-05-30 LAB — LIPID PANEL
Cholesterol: 230 mg/dL — ABNORMAL HIGH (ref 0–200)
HDL: 40.4 mg/dL (ref >39.00)
NonHDL: 189.66
Total CHOL/HDL Ratio: 6
Triglycerides: 246 mg/dL — ABNORMAL HIGH (ref 0.0–149.0)
VLDL: 49.2 mg/dL — ABNORMAL HIGH (ref 0.0–40.0)

## 2019-05-30 LAB — LDL CHOLESTEROL, DIRECT: Direct LDL: 162 mg/dL

## 2019-05-30 NOTE — Assessment & Plan Note (Signed)
Well controlled on current regimen. Renal function is due  Lab Results  Component Value Date   CREATININE 1.02 05/21/2018

## 2019-05-30 NOTE — Assessment & Plan Note (Signed)
All symptoms resolved except numbness of index finger.  MRI reviewed with patient  . No intervention recommended unless he develops weakness of extremities

## 2019-05-30 NOTE — Assessment & Plan Note (Signed)

## 2019-05-30 NOTE — Assessment & Plan Note (Signed)
Based on last  lipid profile, the risk of clinically significant CAD is 15 % over the next 10 years, using the Framingham risk calculator. Statin therapy was offered but deferred in 2019.  Repeat lipids are due . Tyler Peters    Lab Results  Component Value Date   CHOL 202 (H) 05/21/2018   HDL 38.20 (L) 05/21/2018   LDLCALC 127 (H) 05/21/2018   LDLDIRECT 141.0 05/18/2017   TRIG 183.0 (H) 05/21/2018   CHOLHDL 5 05/21/2018

## 2019-05-30 NOTE — Assessment & Plan Note (Signed)
Recurrent.  Refilled prednisone

## 2019-05-31 LAB — VITAMIN D 25 HYDROXY (VIT D DEFICIENCY, FRACTURES): VITD: 37.85 ng/mL (ref 30.00–100.00)

## 2019-05-31 LAB — PSA: PSA: 1.38 ng/mL (ref 0.10–4.00)

## 2019-11-14 ENCOUNTER — Other Ambulatory Visit: Payer: Self-pay | Admitting: Internal Medicine

## 2019-11-14 DIAGNOSIS — I10 Essential (primary) hypertension: Secondary | ICD-10-CM

## 2019-11-26 ENCOUNTER — Other Ambulatory Visit: Payer: Self-pay

## 2019-11-28 ENCOUNTER — Other Ambulatory Visit: Payer: Self-pay

## 2019-11-28 ENCOUNTER — Ambulatory Visit: Payer: No Typology Code available for payment source | Admitting: Internal Medicine

## 2019-11-28 ENCOUNTER — Encounter: Payer: Self-pay | Admitting: Internal Medicine

## 2019-11-28 VITALS — BP 116/80 | HR 96 | Temp 98.0°F | Resp 14 | Ht 68.0 in | Wt 229.6 lb

## 2019-11-28 DIAGNOSIS — M489 Spondylopathy, unspecified: Secondary | ICD-10-CM | POA: Diagnosis not present

## 2019-11-28 DIAGNOSIS — Z6835 Body mass index (BMI) 35.0-35.9, adult: Secondary | ICD-10-CM

## 2019-11-28 DIAGNOSIS — R7301 Impaired fasting glucose: Secondary | ICD-10-CM

## 2019-11-28 DIAGNOSIS — E782 Mixed hyperlipidemia: Secondary | ICD-10-CM | POA: Diagnosis not present

## 2019-11-28 DIAGNOSIS — I10 Essential (primary) hypertension: Secondary | ICD-10-CM

## 2019-11-28 DIAGNOSIS — M720 Palmar fascial fibromatosis [Dupuytren]: Secondary | ICD-10-CM | POA: Diagnosis not present

## 2019-11-28 DIAGNOSIS — M4698 Unspecified inflammatory spondylopathy, sacral and sacrococcygeal region: Secondary | ICD-10-CM

## 2019-11-28 DIAGNOSIS — E6609 Other obesity due to excess calories: Secondary | ICD-10-CM

## 2019-11-28 DIAGNOSIS — M533 Sacrococcygeal disorders, not elsewhere classified: Secondary | ICD-10-CM

## 2019-11-28 DIAGNOSIS — E66812 Obesity, class 2: Secondary | ICD-10-CM

## 2019-11-28 LAB — COMPREHENSIVE METABOLIC PANEL
ALT: 15 U/L (ref 0–53)
AST: 16 U/L (ref 0–37)
Albumin: 4.2 g/dL (ref 3.5–5.2)
Alkaline Phosphatase: 56 U/L (ref 39–117)
BUN: 18 mg/dL (ref 6–23)
CO2: 31 mEq/L (ref 19–32)
Calcium: 9.5 mg/dL (ref 8.4–10.5)
Chloride: 102 mEq/L (ref 96–112)
Creatinine, Ser: 1.08 mg/dL (ref 0.40–1.50)
GFR: 70.63 mL/min (ref >60.00)
Glucose, Bld: 108 mg/dL — ABNORMAL HIGH (ref 70–99)
Potassium: 3.9 mEq/L (ref 3.5–5.1)
Sodium: 138 mEq/L (ref 135–145)
Total Bilirubin: 0.7 mg/dL (ref 0.2–1.2)
Total Protein: 6.9 g/dL (ref 6.0–8.3)

## 2019-11-28 LAB — LIPID PANEL
Cholesterol: 209 mg/dL — ABNORMAL HIGH (ref 0–200)
HDL: 36.5 mg/dL — ABNORMAL LOW (ref >39.00)
LDL Cholesterol: 138 mg/dL — ABNORMAL HIGH (ref 0–99)
NonHDL: 172.84
Total CHOL/HDL Ratio: 6
Triglycerides: 172 mg/dL — ABNORMAL HIGH (ref 0.0–149.0)
VLDL: 34.4 mg/dL (ref 0.0–40.0)

## 2019-11-28 NOTE — Patient Instructions (Addendum)
Good to see you!  The tailbone pain you have described sounds like osteoarthritis, especially if you cannot palpate a tender lump in that area (pilonidal cyst then becomes a possibility)  The ring finger issue is early Dupytren's contracture      The natural remedies for cholesterol have not been proven to reduce your risk for a heart attack.  Red Yeast Rice has not been proven either,  But does lower cholesterol, so if you want to try it , the dose is 600 mg twice daily in capsule form, available OTC.   If your ten year risk is > 20%,,  I will make a recommendation to try a statin  Rather than red yeast rice (RYR)   Dupuytren's Contracture Dupuytren's contracture is a condition in which tissue under the skin of the palm becomes thick. This causes one or more of the fingers to curl inward (contract) toward the palm. After a while, the fingers may not be able to straighten out. This condition affects some or all of the fingers and the palm of the hand. This condition may affect one or both hands. Dupuytren's contracture is a long-term (chronic) condition that develops (progresses) slowly over time. There is no cure, but symptoms can be managed and progression can be slowed with treatment. This condition is usually not dangerous or painful, but it can interfere with everyday tasks. What are the causes?  This condition is caused by tissue (fascia) in the palm that gets thicker and tighter. When the fascia thickens, it pulls on the cords of tissue (tendons) that control finger movement. This causes the fingers to contract. The cause of fascia thickening is not known. However, the condition is often passed along from parent to child (inherited). What increases the risk? The following factors may make you more likely to develop this condition:  Being 9 years of age or older.  Being male.  Having a family history of this condition.  Using tobacco products, including cigarettes, chewing tobacco,  and e-cigarettes.  Drinking alcohol excessively.  Having diabetes.  Having a seizure disorder. What are the signs or symptoms? Early symptoms of this condition may include:  Thick, puckered skin on the hand.  One or more lumps (nodules) on the palm. Nodules may be tender when they first appear, but they are generally painless. Later symptoms of this condition may include:  Thick cords of tissue in the palm.  Fingers curled up toward the palm.  Inability to straighten the fingers into their normal position. Though this condition is usually painless, you may have discomfort when holding or grabbing objects. How is this diagnosed? This condition is diagnosed with a physical exam, which may include:  Looking at your hands and feeling your palms. This is to check for thickened fascia and nodules.  Measuring finger motion.  Doing the [place] tabletop test. You may be asked to try to put your hand on a surface, with your palm down and your fingers straight out. How is this treated? There is no cure for this condition, but treatment can relieve discomfort and make symptoms more manageable. Treatment options may include:  Physical therapy. This can strengthen your hand and increase flexibility.  Occupational therapy. This can help you with everyday tasks that may be more difficult because of your condition.  Shots (injections). Substances may be injected into your hand, such as: ? Medicines that help to decrease swelling (corticosteroids). ? Proteins (collagenase) to weaken thick tissue. After a collagenase injection, your health care  provider may stretch your fingers.  Needle aponeurotomy. A needle is pushed through the skin and into the fascia. Moving the needle against the fascia can weaken or break up the thick tissue.  Surgery. This may be needed if your condition causes discomfort or interferes with everyday activities. Physical therapy is usually needed after surgery. No  treatment is guaranteed to cure this condition. Recurrence of symptoms is common. Follow these instructions at home: Hand care  Take these actions to help protect your hand from possible injury: ? Use tools that have padded grips. ? Wear protective gloves while you work with your hands. ? Avoid repetitive hand movements. General instructions  Take over-the-counter and prescription medicines only as told by your health care provider.  Manage any other conditions that you have, such as diabetes.  If physical therapy was prescribed, do exercises as told by your health care provider.  Do not use any products that contain nicotine or tobacco, such as cigarettes, e-cigarettes, and chewing tobacco. If you need help quitting, ask your health care provider.  If you drink alcohol: ? Limit how much you use to:  0-1 drink a day for women.  0-2 drinks a day for men. ? Be aware of how much alcohol is in your drink. In the [place] one drink equals one 12 oz bottle of beer (355 mL), one 5 oz glass of wine (148 mL), or one 1 oz glass of hard liquor (44 mL).  Keep all follow-up visits as told by your health care provider. This is important. Contact a health care provider if:  You develop new symptoms, or your symptoms get worse.  You have pain that gets worse or does not get better with medicine.  You have difficulty or discomfort with everyday tasks.  You develop numbness or tingling. Get help right away if:  You have severe pain.  Your fingers change color or become unusually cold. Summary  Dupuytren's contracture is a condition in which tissue under the skin of the palm becomes thick.  This condition is caused by tissue (fascia) that thickens. When it thickens, it pulls on the cords of tissue (tendons) that control finger movement and makes the fingers to contract.  You are more likely to develop this condition if you are a man, are over 75 years of age, have a family history of the  condition, and drink a lot of alcohol.  This condition can be treated with physical and occupational therapy, injections, and surgery.  Follow instructions about how to care for your hand. Get help right away if you have severe pain or your fingers change color or become cold. This information is not intended to replace advice given to you by your health care provider. Make sure you discuss any questions you have with your health care provider. Document Revised: 02/06/2018 Document Reviewed: 02/06/2018 Elsevier Patient Education  [place].

## 2019-11-28 NOTE — Progress Notes (Signed)
Subjective:  Patient ID: Tyler Peters, male    DOB: 03-25-1961  Age: 59 y.o. MRN: [id number]  CC: The primary encounter diagnosis was Hyperlipidemia, mixed. Diagnoses of Hypertension, essential, Dupuytren's contracture of right hand, Coccygeal arthritis, Class 2 obesity due to excess calories without serious comorbidity with body mass index (BMI) of 35.0 to 35.9 in adult, and Nontraumatic coccydynia were also pertinent to this visit.  HPI Tyler Peters presents for 6 month follow up on chronic conditions including hypertension, hyperlipidemia, and obesity.  This visit occurred during the SARS-CoV-2 public health emergency.  Safety protocols were in place, including screening questions prior to the visit, additional usage of staff PPE, and extensive cleaning of exam room while observing appropriate contact time as indicated for disinfecting solutions.    Patient has received both doses of the available COVID 19 vaccine without complications.  Patient continues to mask when outside of the home except when walking in yard or at safe distances from others .  Patient denies any change in mood or development of unhealthy behaviors resuting from the pandemic's restriction of activities and socialization.    1) Hypertension: patient checks blood pressure twice weekly at home.  Readings have been for the most part <130/80 at rest . Patient is following a reduce salt diet most days and is taking medications as prescribed  2) He has developed an intermittent mechanical problem involving the Ring finger left hand ; he frequently notes that it is getting stuck in closed position in early morning  3) tailbone pain : he notes episodic pain lasting only seconds that is brought on by shifting his weight from one hip to the other while sitting, also occurs when standing up suddenly,  And also brought on by Sitting for long periods of time. He denies any known history of pelvic fracture or recent fall and has no  signs or symptoms of pilonidal cyst.     Outpatient Medications Prior to Visit  Medication Sig Dispense Refill  . famotidine (PEPCID) 20 MG tablet Take 1 tablet (20 mg total) by mouth 2 (two) times daily. 90 tablet 1  . gabapentin (NEURONTIN) 100 MG capsule Take 1 capsule (100 mg total) by mouth 3 (three) times daily. 90 capsule 3  . hydrochlorothiazide (HYDRODIURIL) 25 MG tablet TAKE 1 TABLET BY MOUTH EVERY DAY 90 tablet 1  . meloxicam (MOBIC) 7.5 MG tablet TAKE 1 TABLET (7.5 MG TOTAL) BY MOUTH DAILY. TAKE WITH FOOD. 30 tablet 0  . Multiple Vitamins-Minerals (MULTIVITAMIN WITH MINERALS) tablet Take 1 tablet by mouth daily.    Marland Kitchen albuterol (PROVENTIL HFA;VENTOLIN HFA) 108 (90 Base) MCG/ACT inhaler Inhale 2 puffs into the lungs every 6 (six) hours as needed for wheezing or shortness of breath. 1 Inhaler 0  . beclomethasone (QVAR) 40 MCG/ACT inhaler Inhale 2 puffs into the lungs 2 (two) times daily. 1 Inhaler 0  . predniSONE (DELTASONE) 10 MG tablet 6 tablets  Daily for 5 days ,  Then reduce by 1 tablet every 2 days until gone (Patient not taking: Reported on 11/28/2019) 60 tablet 0   No facility-administered medications prior to visit.    Review of Systems;  Patient denies headache, fevers, malaise, unintentional weight loss, skin rash, eye pain, sinus congestion and sinus pain, sore throat, dysphagia,  hemoptysis , cough, dyspnea, wheezing, chest pain, palpitations, orthopnea, edema, abdominal pain, nausea, melena, diarrhea, constipation, flank pain, dysuria, hematuria, urinary  Frequency, nocturia, numbness, tingling, seizures,  Focal weakness, Loss of consciousness,  Tremor,  insomnia, depression, anxiety, and suicidal ideation.      Objective:  BP 116/80 (BP Location: Left Arm, Patient Position: Sitting, Cuff Size: Normal)   Pulse 96   Temp 98 F (36.7 C) (Temporal)   Resp 14   Ht 5\' 8"  (1.727 m)   Wt 229 lb 9.6 oz (104.1 kg)   SpO2 98%   BMI 34.91 kg/m   BP Readings from Last 3  Encounters:  11/28/19 116/80  05/29/19 124/86  07/02/18 138/88    Wt Readings from Last 3 Encounters:  11/28/19 229 lb 9.6 oz (104.1 kg)  05/29/19 224 lb 12.8 oz (102 kg)  07/02/18 232 lb 3.2 oz (105.3 kg)    General appearance: alert, cooperative and appears stated age Ears: normal TM's and external ear canals both ears Throat: lips, mucosa, and tongue normal; teeth and gums normal Neck: no adenopathy, no carotid bruit, supple, symmetrical, trachea midline and thyroid not enlarged, symmetric, no tenderness/mass/nodules Back: symmetric, no curvature. ROM normal. No CVA tenderness. Lungs: clear to auscultation bilaterally Heart: regular rate and rhythm, S1, S2 normal, no murmur, click, rub or gallop Abdomen: soft, non-tender; bowel sounds normal; no masses,  no organomegaly Pulses: 2+ and symmetric Skin: Skin color, texture, turgor normal. No rashes or lesions Lymph nodes: Cervical, supraclavicular, and axillary nodes normal.  No results found for: HGBA1C  Lab Results  Component Value Date   CREATININE 1.08 11/28/2019   CREATININE 1.00 05/29/2019   CREATININE 1.02 05/21/2018    Lab Results  Component Value Date   WBC 8.8 01/18/2016   HGB 15.8 01/18/2016   HCT 46.7 01/18/2016   PLT 290.0 01/18/2016   GLUCOSE 108 (H) 11/28/2019   CHOL 209 (H) 11/28/2019   TRIG 172.0 (H) 11/28/2019   HDL 36.50 (L) 11/28/2019   LDLDIRECT 162.0 05/29/2019   LDLCALC 138 (H) 11/28/2019   ALT 15 11/28/2019   AST 16 11/28/2019   NA 138 11/28/2019   K 3.9 11/28/2019   CL 102 11/28/2019   CREATININE 1.08 11/28/2019   BUN 18 11/28/2019   CO2 31 11/28/2019   TSH 1.98 05/21/2018   PSA 1.38 05/29/2019   MICROALBUR 1.0 05/18/2017    MR Cervical Spine Wo Contrast  Result Date: 01/10/2019 CLINICAL DATA:  Initial evaluation for right arm pain since November of 2019, radiating into the right hand with associated numbness. EXAM: MRI CERVICAL SPINE WITHOUT CONTRAST TECHNIQUE: Multiplanar,  multisequence MR imaging of the cervical spine was performed. No intravenous contrast was administered. COMPARISON:  Prior radiograph from 07/02/2018 FINDINGS: Alignment: Straightening with slight reversal of the normal cervical lordosis. No listhesis or malalignment. Vertebrae: Vertebral body heights maintained without evidence for acute or chronic fracture. Bone marrow signal intensity within normal limits. No discrete or worrisome osseous lesions. No abnormal marrow edema. Cord: Signal intensity within the cervical spinal cord is normal. Posterior Fossa, vertebral arteries, paraspinal tissues: Visualized brain and posterior fossa within normal limits. Craniocervical junction normal. Paraspinous and prevertebral soft tissues normal. Normal intravascular flow voids seen within the vertebral arteries bilaterally. Disc levels: C2-C3: Shallow broad-based central disc protrusion mildly indents the ventral thecal sac. No significant stenosis or cord impingement. Foramina remain patent. C3-C4: Small central disc protrusion mildly indents the ventral thecal sac. No significant cord deformity or stenosis. Foramina remain widely patent. C4-C5: Tiny central disc protrusion minimally indents the ventral thecal sac. No stenosis or impingement. Foramina remain patent. C5-C6: Right paracentral disc osteophyte indents the right ventral thecal sac, contacting and flattening the right hemi  cord (series 8, image 19). No cord signal changes. Mild spinal stenosis with moderate right C6 foraminal narrowing. Left neural foramen remains patent. C6-C7: Circumferential disc osteophyte with intervertebral disc space narrowing. Broad posterior component flattens and effaces the ventral thecal sac, slightly worse on the left. Mild spinal stenosis with moderate bilateral C7 foraminal narrowing. C7-T1:  Negative. Visualized upper thoracic spine demonstrates no significant finding. IMPRESSION: 1. 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 Query right C6 radiculitis. 2. 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 3. Small central disc protrusions at C2-3 through C4-5 without significant stenosis or impingement. Electronically Signed   By: Jeannine Boga M.D.   On: 01/10/2019 22:59    Assessment & Plan:   Problem List Items Addressed This Visit      Unprioritized   Coccygeal arthritis   Dupuytren's contracture of right hand    Symptoms are mild currently.  orthopedics referral deferred for now.       Hyperlipidemia, mixed - Primary    Based on current  lipid profile, the risk of clinically significant CAD is 17 % over the next 10 years, using the Framingham risk calculator. Statin therapy was offered but deferred  In 2019.  Depending on his future a1c,  Will strongly recommend therapy  Lab Results  Component Value Date   CHOL 209 (H) 11/28/2019   HDL 36.50 (L) 11/28/2019   LDLCALC 138 (H) 11/28/2019   LDLDIRECT 162.0 05/29/2019   TRIG 172.0 (H) 11/28/2019   CHOLHDL 6 11/28/2019         Relevant Orders   Lipid panel (Completed)   Hypertension, essential    Well controlled on current regimen. Renal function stable, no changes today.  Lab Results  Component Value Date   CREATININE 1.08 11/28/2019   Lab Results  Component Value Date   NA 138 11/28/2019   K 3.9 11/28/2019   CL 102 11/28/2019   CO2 31 11/28/2019         Relevant Orders   Comprehensive metabolic panel (Completed)   Nontraumatic coccydynia    Likely secondary to OA.  Symptoms are mild.  Recommend use of gel cushion when sitting for long periods of time.       Obesity, unspecified    I have addressed  BMI and recommended wt loss of 10% of body weigh tover the next 6 months using a low glycemic index diet and regular exercise a minimum of 5 days per  week. Fasting glucose is mildly elevated,  Will screen for diabetes risk at next visit             I have discontinued Harkirat L. Goeken "Loryn"'s albuterol, beclomethasone, and predniSONE. I am also having him maintain his multivitamin with minerals, famotidine, meloxicam, gabapentin, and hydrochlorothiazide.  No orders of the defined types were placed in this encounter.   Medications Discontinued During This Encounter  Medication Reason  . predniSONE (DELTASONE) 10 MG tablet Completed Course  . albuterol (PROVENTIL HFA;VENTOLIN HFA) 108 (90 Base) MCG/ACT inhaler   . beclomethasone (QVAR) 40 MCG/ACT inhaler     I provided  30 minutes of  face-to-face time during this encounter reviewing patient's current problems and past surgeries, labs and imaging studies, providing counseling on the above mentioned problems , and coordination  of care .  Follow-up: Return in about 6 months (around  05/29/2020).   Crecencio Mc, MD

## 2019-11-30 DIAGNOSIS — M533 Sacrococcygeal disorders, not elsewhere classified: Secondary | ICD-10-CM | POA: Insufficient documentation

## 2019-11-30 NOTE — Assessment & Plan Note (Signed)
Symptoms are mild currently.  orthopedics referral deferred for now.

## 2019-11-30 NOTE — Assessment & Plan Note (Signed)
Based on current  lipid profile, the risk of clinically significant CAD is 17 % over the next 10 years, using the Framingham risk calculator. Statin therapy was offered but deferred  In 2019.  Depending on his future a1c,  Will strongly recommend therapy  Lab Results  Component Value Date   CHOL 209 (H) 11/28/2019   HDL 36.50 (L) 11/28/2019   LDLCALC 138 (H) 11/28/2019   LDLDIRECT 162.0 05/29/2019   TRIG 172.0 (H) 11/28/2019   CHOLHDL 6 11/28/2019

## 2019-11-30 NOTE — Assessment & Plan Note (Addendum)
I have addressed  BMI and recommended wt loss of 10% of body weigh tover the next 6 months using a low glycemic index diet and regular exercise a minimum of 5 days per week. Fasting glucose is mildly elevated,  Will screen for diabetes risk at next visit

## 2019-11-30 NOTE — Assessment & Plan Note (Signed)
Well controlled on current regimen. Renal function stable, no changes today.  Lab Results  Component Value Date   CREATININE 1.08 11/28/2019   Lab Results  Component Value Date   NA 138 11/28/2019   K 3.9 11/28/2019   CL 102 11/28/2019   CO2 31 11/28/2019

## 2019-11-30 NOTE — Assessment & Plan Note (Signed)
Likely secondary to OA.  Symptoms are mild.  Recommend use of gel cushion when sitting for long periods of time.

## 2020-05-15 ENCOUNTER — Other Ambulatory Visit: Payer: Self-pay | Admitting: Internal Medicine

## 2020-05-15 DIAGNOSIS — I10 Essential (primary) hypertension: Secondary | ICD-10-CM

## 2020-05-15 MED ORDER — HYDROCHLOROTHIAZIDE 25 MG PO TABS: 25.0000 mg | ORAL_TABLET | Freq: Every day | ORAL | 1 refills | Status: DC

## 2020-05-15 NOTE — Addendum Note (Signed)
Addended by: Ezequiel Ganser on: 05/15/2020 02:22 PM   Modules accepted: Orders

## 2020-05-29 ENCOUNTER — Ambulatory Visit (INDEPENDENT_AMBULATORY_CARE_PROVIDER_SITE_OTHER): Payer: No Typology Code available for payment source | Admitting: Internal Medicine

## 2020-05-29 ENCOUNTER — Other Ambulatory Visit: Payer: Self-pay

## 2020-05-29 ENCOUNTER — Encounter: Payer: Self-pay | Admitting: Internal Medicine

## 2020-05-29 VITALS — BP 112/78 | HR 95 | Temp 98.5°F | Ht 66.5 in | Wt 221.6 lb

## 2020-05-29 DIAGNOSIS — R7303 Prediabetes: Secondary | ICD-10-CM

## 2020-05-29 DIAGNOSIS — Z0000 Encounter for general adult medical examination without abnormal findings: Secondary | ICD-10-CM

## 2020-05-29 DIAGNOSIS — Z125 Encounter for screening for malignant neoplasm of prostate: Secondary | ICD-10-CM

## 2020-05-29 DIAGNOSIS — Z23 Encounter for immunization: Secondary | ICD-10-CM

## 2020-05-29 DIAGNOSIS — R0681 Apnea, not elsewhere classified: Secondary | ICD-10-CM

## 2020-05-29 DIAGNOSIS — E782 Mixed hyperlipidemia: Secondary | ICD-10-CM

## 2020-05-29 DIAGNOSIS — R0683 Snoring: Secondary | ICD-10-CM | POA: Diagnosis not present

## 2020-05-29 DIAGNOSIS — R7301 Impaired fasting glucose: Secondary | ICD-10-CM

## 2020-05-29 DIAGNOSIS — I10 Essential (primary) hypertension: Secondary | ICD-10-CM

## 2020-05-29 DIAGNOSIS — R5383 Other fatigue: Secondary | ICD-10-CM | POA: Diagnosis not present

## 2020-05-29 LAB — COMPREHENSIVE METABOLIC PANEL
ALT: 16 U/L (ref 0–53)
AST: 15 U/L (ref 0–37)
Albumin: 4.4 g/dL (ref 3.5–5.2)
Alkaline Phosphatase: 54 U/L (ref 39–117)
BUN: 19 mg/dL (ref 6–23)
CO2: 32 mEq/L (ref 19–32)
Calcium: 9.7 mg/dL (ref 8.4–10.5)
Chloride: 100 mEq/L (ref 96–112)
Creatinine, Ser: 1.11 mg/dL (ref 0.40–1.50)
GFR: 74.03 mL/min (ref >60.00)
Glucose, Bld: 96 mg/dL (ref 70–99)
Potassium: 4.1 mEq/L (ref 3.5–5.1)
Sodium: 139 mEq/L (ref 135–145)
Total Bilirubin: 0.8 mg/dL (ref 0.2–1.2)
Total Protein: 6.9 g/dL (ref 6.0–8.3)

## 2020-05-29 LAB — LIPID PANEL
Cholesterol: 203 mg/dL — ABNORMAL HIGH (ref 0–200)
HDL: 38.2 mg/dL — ABNORMAL LOW (ref >39.00)
LDL Cholesterol: 128 mg/dL — ABNORMAL HIGH (ref 0–99)
NonHDL: 164.55
Total CHOL/HDL Ratio: 5
Triglycerides: 182 mg/dL — ABNORMAL HIGH (ref 0.0–149.0)
VLDL: 36.4 mg/dL (ref 0.0–40.0)

## 2020-05-29 LAB — HEMOGLOBIN A1C: Hgb A1c MFr Bld: 6.1 % (ref 4.6–6.5)

## 2020-05-29 LAB — PSA: PSA: 1.54 ng/mL (ref 0.10–4.00)

## 2020-05-29 LAB — TSH: TSH: 1.62 u[IU]/mL (ref 0.35–4.50)

## 2020-05-29 MED ORDER — ZOSTER VAC RECOMB ADJUVANTED 50 MCG/0.5ML IM SUSR: 0.5000 mL | Freq: Once | INTRAMUSCULAR | 1 refills | Status: AC

## 2020-05-29 NOTE — Progress Notes (Signed)
Patient ID: Tyler Peters, male    DOB: 12-20-1960  Age: 59 y.o. MRN: [id number]  The patient is here for annual preventive examination and management of other chronic and acute problems.  This visit occurred during the SARS-CoV-2 public health emergency.  Safety protocols were in place, including screening questions prior to the visit, additional usage of staff PPE, and extensive cleaning of exam room while observing appropriate contact time as indicated for disinfecting solutions.      The risk factors are reflected in the social history.  The roster of all physicians providing medical care to patient - is listed in the Snapshot section of the chart.  Activities of daily living:  The patient is 100% independent in all ADLs: dressing, toileting, feeding as well as independent mobility  Home safety : The patient has smoke detectors in the home. They wear seatbelts.  There are no firearms at home. There is no violence in the home.   There is no risks for hepatitis, STDs or HIV. There is no   history of blood transfusion. They have no travel history to infectious disease endemic areas of the world.  The patient has seen their dentist in the last six month. They have seen their eye doctor in the last year. They admit to slight hearing difficulty with regard to whispered voices and some television programs.  They have deferred audiologic testing in the last year.  They do not  have excessive sun exposure. Discussed the need for sun protection: hats, long sleeves and use of sunscreen if there is significant sun exposure.   Diet: the importance of a healthy diet is discussed. They do have a healthy diet.  The benefits of regular aerobic exercise were discussed. She walks 4 times per week ,  20 minutes.   Depression screen: there are no signs or vegative symptoms of depression- irritability, change in appetite, anhedonia, sadness/tearfullness.   The following portions of the patient's history were  reviewed and updated as appropriate: allergies, current medications, past family history, past medical history,  past surgical history, past social history  and problem list.  Visual acuity was not assessed per patient preference since she has regular follow up with her ophthalmologist. Hearing and body mass index were assessed and reviewed.   During the course of the visit the patient was educated and counseled about appropriate screening and preventive services including : fall prevention , diabetes screening, nutrition counseling, colorectal cancer screening, and recommended immunizations.    CC: The primary encounter diagnosis was Fatigue, unspecified type. Diagnoses of Prostate cancer screening, Snoring, Witnessed apneic spells, Impaired fasting glucose, Hyperlipidemia, mixed, Encounter for preventive health examination, Need for immunization against influenza, Hypertension, essential, and Prediabetes were also pertinent to this visit.  History Dabney has a past medical history of Episodic recurrent vertigo, Episodic recurrent vertigo, History of acute prostatitis (Fall 2008), Hyperlipidemia, mixed, Hypertension, and Testicular pain (2008).   He has a past surgical history that includes Nasal septum surgery (2004); Lithotripsy; and Colonoscopy with propofol (N/A, 01/17/2018).   His family history includes Diabetes in his maternal grandmother; Heart disease in his father and maternal grandmother.He reports that he has never smoked. He has never used smokeless tobacco. He reports that he does not drink alcohol and does not use drugs.  Outpatient Medications Prior to Visit  Medication Sig Dispense Refill  . famotidine (PEPCID) 20 MG tablet Take 1 tablet (20 mg total) by mouth 2 (two) times daily. (Patient taking differently: Take 20 mg  by mouth daily. ) 90 tablet 1  . gabapentin (NEURONTIN) 100 MG capsule Take 1 capsule (100 mg total) by mouth 3 (three) times daily. 90 capsule 3  .  hydrochlorothiazide (HYDRODIURIL) 25 MG tablet Take 1 tablet (25 mg total) by mouth daily. 90 tablet 1  . meloxicam (MOBIC) 7.5 MG tablet TAKE 1 TABLET (7.5 MG TOTAL) BY MOUTH DAILY. TAKE WITH FOOD. 30 tablet 0  . Multiple Vitamins-Minerals (MULTIVITAMIN WITH MINERALS) tablet Take 1 tablet by mouth daily.     No facility-administered medications prior to visit.    Review of Systems  Patient denies headache, fevers, malaise, unintentional weight loss, skin rash, eye pain, sinus congestion and sinus pain, sore throat, dysphagia,  hemoptysis , cough, dyspnea, wheezing, chest pain, palpitations, orthopnea, edema, abdominal pain, nausea, melena, diarrhea, constipation, flank pain, dysuria, hematuria, urinary  Frequency, nocturia, numbness, tingling, seizures,  Focal weakness, Loss of consciousness,  Tremor, insomnia, depression, anxiety, and suicidal ideation.     Objective:  BP 112/78   Pulse 95   Temp 98.5 F (36.9 C)   Ht 5' 6.5" (1.689 m)   Wt 221 lb 9.6 oz (100.5 kg)   SpO2 98%   BMI 35.23 kg/m   Physical Exam  General appearance: alert, cooperative and appears stated age Ears: normal TM's and external ear canals both ears Throat: lips, mucosa, and tongue normal; teeth and gums normal Neck: no adenopathy, no carotid bruit, supple, symmetrical, trachea midline and thyroid not enlarged, symmetric, no tenderness/mass/nodules Back: symmetric, no curvature. ROM normal. No CVA tenderness. Lungs: clear to auscultation bilaterally Heart: regular rate and rhythm, S1, S2 normal, no murmur, click, rub or gallop Abdomen: soft, non-tender; bowel sounds normal; no masses,  no organomegaly Pulses: 2+ and symmetric Skin: Skin color, texture, turgor normal. No rashes or lesions Lymph nodes: Cervical, supraclavicular, and axillary nodes normal.  Assessment & Plan:   Problem List Items Addressed This Visit      Unprioritized   Encounter for preventive health examination    age appropriate  education and counseling updated, referrals for preventative services and immunizations addressed, dietary and smoking counseling addressed, most recent labs reviewed.  I have personally reviewed and have noted:  1) the patient's medical and social history 2) The pt's use of alcohol, tobacco, and illicit drugs 3) The patient's current medications and supplements 4) Functional ability including ADL's, fall risk, home safety risk, hearing and visual impairment 5) Diet and physical activities 6) Evidence for depression or mood disorder 7) The patient's height, weight, and BMI have been recorded in the chart  I have made referrals, and provided counseling and education based on review of the above      Hyperlipidemia, mixed    Based on previous lipid profile, the risk of clinically significant CAD is 17 % over the next 10 years, using the Framingham risk calculator. Statin therapy was offered but deferred and cholesterol has improved   Lab Results  Component Value Date   CHOL 203 (H) 05/29/2020   HDL 38.20 (L) 05/29/2020   LDLCALC 128 (H) 05/29/2020   LDLDIRECT 162.0 05/29/2019   TRIG 182.0 (H) 05/29/2020   CHOLHDL 5 05/29/2020         Hypertension, essential    Well controlled on current regimen. Renal function stable, no changes today.      Snoring   Prediabetes    His  random glucose is mildly  Elevated and her A1c suggests he is at risk for developing diabetes.  I  recommend he follow a low glycemic index diet and particpate regularly in an aerobic  exercise activity.  We should check an A1c in 6 months.         Other Visit Diagnoses    Fatigue, unspecified type    -  Primary   Relevant Orders   Testos,Total,Free and SHBG (Male)   TSH (Completed)   Prostate cancer screening       Relevant Orders   PSA (Completed)   Witnessed apneic spells       Relevant Orders   Home sleep test   Impaired fasting glucose       Need for immunization against influenza        Relevant Orders   Flu Vaccine QUAD 36+ mos IM (Completed)      I am having Amar L. Rollene Rotunda "Loryn" start on Zoster Vaccine Adjuvanted. I am also having him maintain his multivitamin with minerals, famotidine, meloxicam, gabapentin, and hydrochlorothiazide.  Meds ordered this encounter  Medications  . Zoster Vaccine Adjuvanted [facility]) injection    Sig: Inject 0.5 mLs into the muscle once for 1 dose.    Dispense:  1 each    Refill:  1    There are no discontinued medications.  Follow-up: No follow-ups on file.   Crecencio Mc, MD

## 2020-05-29 NOTE — Patient Instructions (Signed)
Home sleep study ordered   The ShingRx vaccine is now available in local pharmacies and is much more protective thant Zostavaxs,  It is therefore ADVISED for all interested adults over 50 to prevent shingles   I would wait 2 weeks after booster vaccine to get your first dose    Health Maintenance, Male Adopting a healthy lifestyle and getting preventive care are important in promoting health and wellness. Ask your health care provider about:  The right schedule for you to have regular tests and exams.  Things you can do on your own to prevent diseases and keep yourself healthy. What should I know about diet, weight, and exercise? Eat a healthy diet   Eat a diet that includes plenty of vegetables, fruits, low-fat dairy products, and lean protein.  Do not eat a lot of foods that are high in solid fats, added sugars, or sodium. Maintain a healthy weight Body mass index (BMI) is a measurement that can be used to identify possible weight problems. It estimates body fat based on height and weight. Your health care provider can help determine your BMI and help you achieve or maintain a healthy weight. Get regular exercise Get regular exercise. This is one of the most important things you can do for your health. Most adults should:  Exercise for at least 150 minutes each week. The exercise should increase your heart rate and make you sweat (moderate-intensity exercise).  Do strengthening exercises at least twice a week. This is in addition to the moderate-intensity exercise.  Spend less time sitting. Even light physical activity can be beneficial. Watch cholesterol and blood lipids Have your blood tested for lipids and cholesterol at 57 years of age, then have this test every 5 years. You may need to have your cholesterol levels checked more often if:  Your lipid or cholesterol levels are high.  You are older than 57 years of age.  You are at high risk for heart disease. What should I  know about cancer screening? Many types of cancers can be detected early and may often be prevented. Depending on your health history and family history, you may need to have cancer screening at various ages. This may include screening for:  Colorectal cancer.  Prostate cancer.  Skin cancer.  Lung cancer. What should I know about heart disease, diabetes, and high blood pressure? Blood pressure and heart disease  High blood pressure causes heart disease and increases the risk of stroke. This is more likely to develop in people who have high blood pressure readings, are of African descent, or are overweight.  Talk with your health care provider about your target blood pressure readings.  Have your blood pressure checked: ? Every 3-5 years if you are 57-57 years of age years of age. ? Every year if you are 57 years old or older.  If you are between the ages of 5 and 72 and are a current or former smoker, ask your health care provider if you should have a one-time screening for abdominal aortic aneurysm (AAA). Diabetes Have regular diabetes screenings. This checks your fasting blood sugar level. Have the screening done:  Once every three years after age 39 if you are at a normal weight and have a low risk for diabetes.  More often and at a younger age if you are overweight or have a high risk for diabetes. What should I know about preventing infection? Hepatitis B If you have a higher risk for hepatitis B, you should be screened for  this virus. Talk with your health care provider to find out if you are at risk for hepatitis B infection. Hepatitis C Blood testing is recommended for:  Everyone born from [birth year] through 1963.  Anyone with known risk factors for hepatitis C. Sexually transmitted infections (STIs)  You should be screened each year for STIs, including gonorrhea and chlamydia, if: ? You are sexually active and are younger than 57 years of age. ? You are older than 57 years of age and  your health care provider tells you that you are at risk for this type of infection. ? Your sexual activity has changed since you were last screened, and you are at increased risk for chlamydia or gonorrhea. Ask your health care provider if you are at risk.  Ask your health care provider about whether you are at high risk for HIV. Your health care provider may recommend a prescription medicine to help prevent HIV infection. If you choose to take medicine to prevent HIV, you should first get tested for HIV. You should then be tested every 3 months for as long as you are taking the medicine. Follow these instructions at home: Lifestyle  Do not use any products that contain nicotine or tobacco, such as cigarettes, e-cigarettes, and chewing tobacco. If you need help quitting, ask your health care provider.  Do not use street drugs.  Do not share needles.  Ask your health care provider for help if you need support or information about quitting drugs. Alcohol use  Do not drink alcohol if your health care provider tells you not to drink.  If you drink alcohol: ? Limit how much you have to 0-2 drinks a day. ? Be aware of how much alcohol is in your drink. In the [place] one drink equals one 12 oz bottle of beer (355 mL), one 5 oz glass of wine (148 mL), or one 1 oz glass of hard liquor (44 mL). General instructions  Schedule regular health, dental, and eye exams.  Stay current with your vaccines.  Tell your health care provider if: ? You often feel depressed. ? You have ever been abused or do not feel safe at home. Summary  Adopting a healthy lifestyle and getting preventive care are important in promoting health and wellness.  Follow your health care provider's instructions about healthy diet, exercising, and getting tested or screened for diseases.  Follow your health care provider's instructions on monitoring your cholesterol and blood pressure. This information is not intended to  replace advice given to you by your health care provider. Make sure you discuss any questions you have with your health care provider. Document Revised: 07/11/2018 Document Reviewed: 07/11/2018 Elsevier Patient Education  2020 [employer].

## 2020-05-29 NOTE — Assessment & Plan Note (Signed)

## 2020-05-31 DIAGNOSIS — R7303 Prediabetes: Secondary | ICD-10-CM | POA: Insufficient documentation

## 2020-05-31 NOTE — Assessment & Plan Note (Signed)
His  random glucose is mildly  Elevated and her A1c suggests he is at risk for developing diabetes.  I recommend he follow a low glycemic index diet and particpate regularly in an aerobic  exercise activity.  We should check an A1c in 6 months.

## 2020-05-31 NOTE — Assessment & Plan Note (Signed)
Well controlled on current regimen. Renal function stable, no changes today. 

## 2020-05-31 NOTE — Assessment & Plan Note (Addendum)
Based on previous lipid profile, the risk of clinically significant CAD is 17 % over the next 10 years, using the Framingham risk calculator. Statin therapy was offered but deferred and cholesterol has improved   Lab Results  Component Value Date   CHOL 203 (H) 05/29/2020   HDL 38.20 (L) 05/29/2020   LDLCALC 128 (H) 05/29/2020   LDLDIRECT 162.0 05/29/2019   TRIG 182.0 (H) 05/29/2020   CHOLHDL 5 05/29/2020

## 2020-05-31 NOTE — Progress Notes (Signed)
Cholesterol Is improving.  A1c and fasting glucose prediabetes range.  All other labs normal.  Regards,   Deborra Medina, MD

## 2020-06-03 LAB — TESTOS,TOTAL,FREE AND SHBG (FEMALE)
Free Testosterone: 50.4 pg/mL (ref 35.0–155.0)
Sex Hormone Binding: 20 nmol/L — ABNORMAL LOW (ref 22–77)
Testosterone, Total, LC-MS-MS: 313 ng/dL (ref 250–1100)

## 2021-02-25 ENCOUNTER — Other Ambulatory Visit: Payer: Self-pay | Admitting: Internal Medicine

## 2021-02-25 DIAGNOSIS — I10 Essential (primary) hypertension: Secondary | ICD-10-CM

## 2021-04-15 ENCOUNTER — Other Ambulatory Visit: Payer: Self-pay

## 2021-04-15 ENCOUNTER — Ambulatory Visit (INDEPENDENT_AMBULATORY_CARE_PROVIDER_SITE_OTHER): Payer: No Typology Code available for payment source | Admitting: Internal Medicine

## 2021-04-15 ENCOUNTER — Encounter: Payer: Self-pay | Admitting: Internal Medicine

## 2021-04-15 VITALS — BP 122/76 | HR 97 | Temp 96.5°F | Ht 66.5 in | Wt 221.8 lb

## 2021-04-15 DIAGNOSIS — E6609 Other obesity due to excess calories: Secondary | ICD-10-CM

## 2021-04-15 DIAGNOSIS — Z23 Encounter for immunization: Secondary | ICD-10-CM | POA: Diagnosis not present

## 2021-04-15 DIAGNOSIS — I10 Essential (primary) hypertension: Secondary | ICD-10-CM

## 2021-04-15 DIAGNOSIS — R7301 Impaired fasting glucose: Secondary | ICD-10-CM

## 2021-04-15 DIAGNOSIS — Z6835 Body mass index (BMI) 35.0-35.9, adult: Secondary | ICD-10-CM

## 2021-04-15 DIAGNOSIS — E782 Mixed hyperlipidemia: Secondary | ICD-10-CM

## 2021-04-15 DIAGNOSIS — Z0000 Encounter for general adult medical examination without abnormal findings: Secondary | ICD-10-CM | POA: Diagnosis not present

## 2021-04-15 DIAGNOSIS — R5383 Other fatigue: Secondary | ICD-10-CM | POA: Diagnosis not present

## 2021-04-15 DIAGNOSIS — Z125 Encounter for screening for malignant neoplasm of prostate: Secondary | ICD-10-CM

## 2021-04-15 DIAGNOSIS — E66812 Obesity, class 2: Secondary | ICD-10-CM

## 2021-04-15 NOTE — Patient Instructions (Addendum)
  I recommend a medication to help you lose weight called Mounjaro. You no longer have to go through [facility] to receive the medication   You can read about it and obtain the $25 copay card on their website: [email or web address]  Darcel Bayley is a medication that is taken as a weekly subcutaneous injection. It is not insulin.  It  causes your pancreas to increase its  own insulin secretion  And also slows down the emptying of your stomach,  So it decreases your appetite and helps you lose weight.  The dose for the first 4 weekly doses is 2.5 mg .  You may have mild nausea on the first or second day but this should resolve.  If not  ,  stop the medication.   As long as you are losing weight,  you can continue the dose you are on .  Only increase the dose to  5.0 mg  after 4 weeks if your weight has plateaued.  Let me know when you need a refill and what dose you are taking.

## 2021-04-15 NOTE — Progress Notes (Signed)
Patient ID: Tyler Peters, male    DOB: 05/09/1961  Age: 60 y.o. MRN: [id number]  The patient is here for annual preventive examination and management of other chronic and acute problems.   The risk factors are reflected in the social history.  The roster of all physicians providing medical care to patient - is listed in the Snapshot section of the chart.  Activities of daily living:  The patient is 100% independent in all ADLs: dressing, toileting, feeding as well as independent mobility  Home safety : The patient has smoke detectors in the home. They wear seatbelts.  There are no firearms at home. There is no violence in the home.   There is no risks for hepatitis, STDs or HIV. There is no   history of blood transfusion. They have no travel history to infectious disease endemic areas of the world.  The patient has seen their dentist in the last six month. They have seen their eye doctor in the last year.  H E DENIES  hearing difficulty with regard to whispered voices and some television programs.  They have deferred audiologic testing in the last year.  They do not  have excessive sun exposure. Discussed the need for sun protection: hats, long sleeves and use of sunscreen if there is significant sun exposure.   Diet: the importance of a healthy diet is discussed. They do have a healthy diet.  The benefits of regular aerobic exercise were discussed. he walks 4 times per week ,  20 minutes.   Depression screen: there are no signs or vegative symptoms of depression- irritability, change in appetite, anhedonia, sadness/tearfullness.  Cognitive assessment: the patient manages all their financial and personal affairs and is actively engaged. They could relate day,date,year and events; recalled 2/3 objects at 3 minutes; performed clock-face test normally.  The following portions of the patient's history were reviewed and updated as appropriate: allergies, current medications, past family history,  past medical history,  past surgical history, past social history  and problem list.  Visual acuity was not assessed per patient preference since she has regular follow up with her ophthalmologist. Hearing and body mass index were assessed and reviewed.   During the course of the visit the patient was educated and counseled about appropriate screening and preventive services including : fall prevention , diabetes screening, nutrition counseling, colorectal cancer screening, and recommended immunizations.    CC: The primary encounter diagnosis was Hyperlipidemia, mixed. Diagnoses of Fatigue, unspecified type, Impaired fasting glucose, Prostate cancer screening, Need for immunization against influenza, Encounter for preventive health examination, Hypertension, essential, and Class 2 obesity due to excess calories without serious comorbidity with body mass index (BMI) of 35.0 to 35.9 in adult were also pertinent to this visit.  1) constant fatigue . Has not at[place] any time off from work since pre COVID.  No problem hiking  ; has gone as far as 25  miles   History [place] has a past medical history of Episodic recurrent vertigo, Episodic recurrent vertigo, History of acute prostatitis (Fall 2008), Hyperlipidemia, mixed, Hypertension, and Testicular pain (2008).   He has a past surgical history that includes Nasal septum surgery (2004); Lithotripsy; and Colonoscopy with propofol (N/A, 01/17/2018).   His family history includes Diabetes in his maternal grandmother; Heart disease in his father and maternal grandmother.He reports that he has never smoked. He has never used smokeless tobacco. He reports that he does not drink alcohol and does not use drugs.  Outpatient Medications Prior  to Visit  Medication Sig Dispense Refill   famotidine (PEPCID) 20 MG tablet Take 1 tablet (20 mg total) by mouth 2 (two) times daily. (Patient taking differently: Take 20 mg by mouth daily.) 90 tablet 1   hydrochlorothiazide  (HYDRODIURIL) 25 MG tablet TAKE 1 TABLET (25 MG TOTAL) BY MOUTH DAILY 90 tablet 1   Multiple Vitamins-Minerals (MULTIVITAMIN WITH MINERALS) tablet Take 1 tablet by mouth daily.     gabapentin (NEURONTIN) 100 MG capsule Take 1 capsule (100 mg total) by mouth 3 (three) times daily. (Patient not taking: Reported on 04/15/2021) 90 capsule 3   meloxicam (MOBIC) 7.5 MG tablet TAKE 1 TABLET (7.5 MG TOTAL) BY MOUTH DAILY. TAKE WITH FOOD. (Patient not taking: Reported on 04/15/2021) 30 tablet 0   No facility-administered medications prior to visit.    Review of Systems  Patient denies headache, fevers, malaise, unintentional weight loss, skin rash, eye pain, sinus congestion and sinus pain, sore throat, dysphagia,  hemoptysis , cough, dyspnea, wheezing, chest pain, palpitations, orthopnea, edema, abdominal pain, nausea, melena, diarrhea, constipation, flank pain, dysuria, hematuria, urinary  Frequency, nocturia, numbness, tingling, seizures,  Focal weakness, Loss of consciousness,  Tremor, insomnia, depression, anxiety, and suicidal ideation.     Objective:  BP 122/76 (BP Location: Left Arm, Patient Position: Sitting, Cuff Size: Large)   Pulse 97   Temp (!) 96.5 F (35.8 C) (Temporal)   Ht 5' 6.5" (1.689 m)   Wt 221 lb 12.8 oz (100.6 kg)   SpO2 95%   BMI 35.26 kg/m   Physical Exam   General appearance: alert, cooperative and appears stated age Ears: normal TM's and external ear canals both ears Throat: lips, mucosa, and tongue normal; teeth and gums normal Neck: no adenopathy, no carotid bruit, supple, symmetrical, trachea midline and thyroid not enlarged, symmetric, no tenderness/mass/nodules Back: symmetric, no curvature. ROM normal. No CVA tenderness. Lungs: clear to auscultation bilaterally Heart: regular rate and rhythm, S1, S2 normal, no murmur, click, rub or gallop Abdomen: soft, non-tender; bowel sounds normal; no masses,  no organomegaly Pulses: 2+ and symmetric Skin: Skin color,  texture, turgor normal. No rashes or lesions Lymph nodes: Cervical, supraclavicular, and axillary nodes normal.   Assessment & Plan:   Problem List Items Addressed This Visit       Unprioritized   Encounter for preventive health examination    age appropriate education and counseling updated, referrals for preventative services and immunizations addressed, dietary and smoking counseling addressed, most recent labs reviewed.  I have personally reviewed and have noted:   1) the patient's medical and social history 2) The pt's use of alcohol, tobacco, and illicit drugs 3) The patient's current medications and supplements 4) Functional ability including ADL's, fall risk, home safety risk, hearing and visual impairment 5) Diet and physical activities 6) Evidence for depression or mood disorder 7) The patient's height, weight, and BMI have been recorded in the chart   I have made referrals, and provided counseling and education based on review of the above      Hyperlipidemia, mixed - Primary    Based on previous lipid profile, the risk of clinically significant CAD is 17 % over the next 10 years, using the Framingham risk calculator. Statin therapy was offered but deferred and cholesterol haD improved but is due for repeat assessment    Lab Results  Component Value Date   CHOL 203 (H) 05/29/2020   HDL 38.20 (L) 05/29/2020   LDLCALC 128 (H) 05/29/2020   LDLDIRECT 162.0  05/29/2019   TRIG 182.0 (H) 05/29/2020   CHOLHDL 5 05/29/2020        Relevant Orders   Lipid panel   Hypertension, essential    Well controlled on current regimen. Renal ASSESSMENT IS DUE.       Obesity, unspecified    I have addressed  BMI and recommended wt loss of 10% of body weigh over the next 6 months using a low glycemic index diet and regular exercise a minimum of 5 days per week. Pharmacotherapy offered with Darcel Bayley ; he is contemplative       Other Visit Diagnoses     Fatigue, unspecified type        Relevant Orders   TSH   CBC with Differential/Platelet   Impaired fasting glucose       Relevant Orders   Hemoglobin A1c   Comprehensive metabolic panel   Prostate cancer screening       Relevant Orders   PSA   Need for immunization against influenza       Relevant Orders   Flu Vaccine QUAD 55moIM (Fluarix, Fluzone & Alfiuria Quad PF) (Completed)       I am having Yaniel L. PRollene Rotunda"Loryn" maintain his multivitamin with minerals, famotidine, meloxicam, gabapentin, and hydrochlorothiazide.  No orders of the defined types were placed in this encounter.   There are no discontinued medications.  Follow-up: No follow-ups on file.   TCrecencio Mc MD

## 2021-04-17 NOTE — Assessment & Plan Note (Signed)

## 2021-04-17 NOTE — Assessment & Plan Note (Signed)
Well controlled on current regimen. Renal ASSESSMENT IS DUE.

## 2021-04-17 NOTE — Assessment & Plan Note (Signed)
I have addressed  BMI and recommended wt loss of 10% of body weigh over the next 6 months using a low glycemic index diet and regular exercise a minimum of 5 days per week. Pharmacotherapy offered with Darcel Bayley ; he is contemplative

## 2021-04-17 NOTE — Assessment & Plan Note (Signed)
Based on previous lipid profile, the risk of clinically significant CAD is 17 % over the next 10 years, using the Framingham risk calculator. Statin therapy was offered but deferred and cholesterol haD improved but is due for repeat assessment    Lab Results  Component Value Date   CHOL 203 (H) 05/29/2020   HDL 38.20 (L) 05/29/2020   LDLCALC 128 (H) 05/29/2020   LDLDIRECT 162.0 05/29/2019   TRIG 182.0 (H) 05/29/2020   CHOLHDL 5 05/29/2020

## 2021-06-07 ENCOUNTER — Other Ambulatory Visit: Payer: Self-pay

## 2021-06-07 ENCOUNTER — Other Ambulatory Visit (INDEPENDENT_AMBULATORY_CARE_PROVIDER_SITE_OTHER): Payer: No Typology Code available for payment source

## 2021-06-07 DIAGNOSIS — R5383 Other fatigue: Secondary | ICD-10-CM | POA: Diagnosis not present

## 2021-06-07 DIAGNOSIS — R7301 Impaired fasting glucose: Secondary | ICD-10-CM | POA: Diagnosis not present

## 2021-06-07 DIAGNOSIS — E782 Mixed hyperlipidemia: Secondary | ICD-10-CM | POA: Diagnosis not present

## 2021-06-07 DIAGNOSIS — Z125 Encounter for screening for malignant neoplasm of prostate: Secondary | ICD-10-CM | POA: Diagnosis not present

## 2021-06-07 LAB — CBC WITH DIFFERENTIAL/PLATELET
Basophils Absolute: 0 10*3/uL (ref 0.0–0.1)
Basophils Relative: 0.7 % (ref 0.0–3.0)
Eosinophils Absolute: 0.2 10*3/uL (ref 0.0–0.7)
Eosinophils Relative: 3.5 % (ref 0.0–5.0)
HCT: 45.9 % (ref 39.0–52.0)
Hemoglobin: 15.3 g/dL (ref 13.0–17.0)
Lymphocytes Relative: 22 % (ref 12.0–46.0)
Lymphs Abs: 1.4 10*3/uL (ref 0.7–4.0)
MCHC: 33.3 g/dL (ref 30.0–36.0)
MCV: 85.8 fl (ref 78.0–100.0)
Monocytes Absolute: 0.6 10*3/uL (ref 0.1–1.0)
Monocytes Relative: 8.6 % (ref 3.0–12.0)
Neutro Abs: 4.3 10*3/uL (ref 1.4–7.7)
Neutrophils Relative %: 65.2 % (ref 43.0–77.0)
Platelets: 273 10*3/uL (ref 150.0–400.0)
RBC: 5.35 Mil/uL (ref 4.22–5.81)
RDW: 14 % (ref 11.5–15.5)
WBC: 6.5 10*3/uL (ref 4.0–10.5)

## 2021-06-07 LAB — LIPID PANEL
Cholesterol: 200 mg/dL (ref 0–200)
HDL: 36.4 mg/dL — ABNORMAL LOW (ref >39.00)
LDL Cholesterol: 125 mg/dL — ABNORMAL HIGH (ref 0–99)
NonHDL: 164.04
Total CHOL/HDL Ratio: 6
Triglycerides: 194 mg/dL — ABNORMAL HIGH (ref 0.0–149.0)
VLDL: 38.8 mg/dL (ref 0.0–40.0)

## 2021-06-07 LAB — COMPREHENSIVE METABOLIC PANEL
ALT: 16 U/L (ref 0–53)
AST: 17 U/L (ref 0–37)
Albumin: 4.2 g/dL (ref 3.5–5.2)
Alkaline Phosphatase: 55 U/L (ref 39–117)
BUN: 16 mg/dL (ref 6–23)
CO2: 26 mEq/L (ref 19–32)
Calcium: 9.2 mg/dL (ref 8.4–10.5)
Chloride: 105 mEq/L (ref 96–112)
Creatinine, Ser: 1.01 mg/dL (ref 0.40–1.50)
GFR: 82.32 mL/min (ref >60.00)
Glucose, Bld: 112 mg/dL — ABNORMAL HIGH (ref 70–99)
Potassium: 3.9 mEq/L (ref 3.5–5.1)
Sodium: 141 mEq/L (ref 135–145)
Total Bilirubin: 0.7 mg/dL (ref 0.2–1.2)
Total Protein: 6.6 g/dL (ref 6.0–8.3)

## 2021-06-07 LAB — HEMOGLOBIN A1C: Hgb A1c MFr Bld: 5.9 % (ref 4.6–6.5)

## 2021-06-07 LAB — TSH: TSH: 1.71 u[IU]/mL (ref 0.35–5.50)

## 2021-06-07 LAB — PSA: PSA: 1.43 ng/mL (ref 0.10–4.00)

## 2021-06-08 NOTE — Assessment & Plan Note (Signed)
Based on previous lipid profile, the risk of clinically significant CAD is 9 % over the next 10 years, using the Framingham risk calculator. Statin therapy was offered but deferred and cholesterol has improved  Lab Results  Component Value Date   CHOL 200 06/07/2021   HDL 36.40 (L) 06/07/2021   LDLCALC 125 (H) 06/07/2021   LDLDIRECT 162.0 05/29/2019   TRIG 194.0 (H) 06/07/2021   CHOLHDL 6 06/07/2021

## 2021-06-08 NOTE — Progress Notes (Signed)
Chart updated with lipid panel and lowered AHA risk

## 2021-09-03 ENCOUNTER — Other Ambulatory Visit: Payer: Self-pay | Admitting: Internal Medicine

## 2021-09-03 DIAGNOSIS — I10 Essential (primary) hypertension: Secondary | ICD-10-CM

## 2022-03-02 ENCOUNTER — Other Ambulatory Visit: Payer: Self-pay | Admitting: Internal Medicine

## 2022-03-02 DIAGNOSIS — I10 Essential (primary) hypertension: Secondary | ICD-10-CM

## 2022-04-15 ENCOUNTER — Encounter: Payer: No Typology Code available for payment source | Admitting: Internal Medicine

## 2022-04-22 ENCOUNTER — Other Ambulatory Visit (INDEPENDENT_AMBULATORY_CARE_PROVIDER_SITE_OTHER): Payer: BC Managed Care – PPO

## 2022-04-22 ENCOUNTER — Ambulatory Visit (INDEPENDENT_AMBULATORY_CARE_PROVIDER_SITE_OTHER): Payer: BC Managed Care – PPO | Admitting: Internal Medicine

## 2022-04-22 ENCOUNTER — Encounter: Payer: Self-pay | Admitting: Internal Medicine

## 2022-04-22 VITALS — BP 126/78 | HR 89 | Temp 98.0°F | Ht 66.5 in | Wt 228.6 lb

## 2022-04-22 DIAGNOSIS — E782 Mixed hyperlipidemia: Secondary | ICD-10-CM | POA: Diagnosis not present

## 2022-04-22 DIAGNOSIS — I10 Essential (primary) hypertension: Secondary | ICD-10-CM | POA: Diagnosis not present

## 2022-04-22 DIAGNOSIS — Z23 Encounter for immunization: Secondary | ICD-10-CM

## 2022-04-22 DIAGNOSIS — R972 Elevated prostate specific antigen [PSA]: Secondary | ICD-10-CM

## 2022-04-22 DIAGNOSIS — M4692 Unspecified inflammatory spondylopathy, cervical region: Secondary | ICD-10-CM

## 2022-04-22 DIAGNOSIS — Z125 Encounter for screening for malignant neoplasm of prostate: Secondary | ICD-10-CM

## 2022-04-22 DIAGNOSIS — R7303 Prediabetes: Secondary | ICD-10-CM

## 2022-04-22 DIAGNOSIS — K219 Gastro-esophageal reflux disease without esophagitis: Secondary | ICD-10-CM

## 2022-04-22 DIAGNOSIS — Z0000 Encounter for general adult medical examination without abnormal findings: Secondary | ICD-10-CM

## 2022-04-22 DIAGNOSIS — E6609 Other obesity due to excess calories: Secondary | ICD-10-CM | POA: Diagnosis not present

## 2022-04-22 DIAGNOSIS — Z6835 Body mass index (BMI) 35.0-35.9, adult: Secondary | ICD-10-CM

## 2022-04-22 LAB — LIPID PANEL
Cholesterol: 200 mg/dL (ref 0–200)
HDL: 38.1 mg/dL — ABNORMAL LOW (ref >39.00)
NonHDL: 161.91
Total CHOL/HDL Ratio: 5
Triglycerides: 230 mg/dL — ABNORMAL HIGH (ref 0.0–149.0)
VLDL: 46 mg/dL — ABNORMAL HIGH (ref 0.0–40.0)

## 2022-04-22 LAB — PSA: PSA: 3.63 ng/mL (ref 0.10–4.00)

## 2022-04-22 LAB — COMPREHENSIVE METABOLIC PANEL
ALT: 22 U/L (ref 0–53)
AST: 18 U/L (ref 0–37)
Albumin: 4.2 g/dL (ref 3.5–5.2)
Alkaline Phosphatase: 57 U/L (ref 39–117)
BUN: 15 mg/dL (ref 6–23)
CO2: 29 mEq/L (ref 19–32)
Calcium: 9.6 mg/dL (ref 8.4–10.5)
Chloride: 100 mEq/L (ref 96–112)
Creatinine, Ser: 1.01 mg/dL (ref 0.40–1.50)
GFR: 81.82 mL/min (ref >60.00)
Glucose, Bld: 79 mg/dL (ref 70–99)
Potassium: 3.8 mEq/L (ref 3.5–5.1)
Sodium: 139 mEq/L (ref 135–145)
Total Bilirubin: 0.7 mg/dL (ref 0.2–1.2)
Total Protein: 6.9 g/dL (ref 6.0–8.3)

## 2022-04-22 LAB — LDL CHOLESTEROL, DIRECT: Direct LDL: 146 mg/dL

## 2022-04-22 LAB — TSH: TSH: 1.08 u[IU]/mL (ref 0.35–5.50)

## 2022-04-22 MED ORDER — PREDNISONE 10 MG PO TABS: ORAL_TABLET | ORAL | 0 refills | Status: DC

## 2022-04-22 MED ORDER — MOMETASONE FUROATE 0.1 % EX CREA: TOPICAL_CREAM | CUTANEOUS | 1 refills | Status: DC

## 2022-04-22 NOTE — Assessment & Plan Note (Signed)
Currently quiescent

## 2022-04-22 NOTE — Progress Notes (Unsigned)
The patient is here for annual preventive examination and management of other chronic and acute problems.   The risk factors are reflected in the social history.   The roster of all physicians providing medical care to patient - is listed in the Snapshot section of the chart.   Activities of daily living:  The patient is 100% independent in all ADLs: dressing, toileting, feeding as well as independent mobility   Home safety : The patient has smoke detectors in the home. They wear seatbelts.  There are no unsecured firearms at home. There is no violence in the home.    There is no risks for hepatitis, STDs or HIV. There is no   history of blood transfusion. They have no travel history to infectious disease endemic areas of the world.   The patient has seen their dentist in the last six month. They have seen their eye doctor in the last year. The patinet  denies slight hearing difficulty with regard to whispered voices and some television programs.  They have deferred audiologic testing in the last year.  They do not  have excessive sun exposure. Discussed the need for sun protection: hats, long sleeves and use of sunscreen if there is significant sun exposure.    Diet: the importance of a healthy diet is discussed. They do have a healthy diet.   The benefits of regular aerobic exercise were discussed. The patient  exercises  3 to 5 days per week  for  60 minutes.    Depression screen: there are no signs or vegative symptoms of depression- irritability, change in appetite, anhedonia, sadness/tearfullness.   The following portions of the patient's history were reviewed and updated as appropriate: allergies, current medications, past family history, past medical history,  past surgical history, past social history  and problem list.   Visual acuity was not assessed per patient preference since the patient has regular follow up with an  ophthalmologist. Hearing and body mass index were assessed and  reviewed.    During the course of the visit the patient was educated and counseled about appropriate screening and preventive services including : fall prevention , diabetes screening, nutrition counseling, colorectal cancer screening, and recommended immunizations.    Chief Complaint:  HPI     Annual Exam    Additional comments: Physical       Last edited by Adair Laundry, [place] on 04/22/2022  1:01 PM.        Review of Symptoms  Patient denies headache, fevers, malaise, unintentional weight loss, skin rash, eye pain, sinus congestion and sinus pain, sore throat, dysphagia,  hemoptysis , cough, dyspnea, wheezing, chest pain, palpitations, orthopnea, edema, abdominal pain, nausea, melena, diarrhea, constipation, flank pain, dysuria, hematuria, urinary  Frequency, nocturia, numbness, tingling, seizures,  Focal weakness, Loss of consciousness,  Tremor, insomnia, depression, anxiety, and suicidal ideation.    Physical Exam:  BP 126/78 (BP Location: Left Arm, Patient Position: Sitting, Cuff Size: Large)   Pulse 89   Temp 98 F (36.7 C) (Oral)   Ht 5' 6.5" (1.689 m)   Wt 228 lb 9.6 oz (103.7 kg)   SpO2 97%   BMI 36.34 kg/m    General appearance: alert, cooperative and appears stated age Ears: normal TM's and external ear canals both ears Throat: lips, mucosa, and tongue normal; teeth and gums normal Neck: no adenopathy, no carotid bruit, supple, symmetrical, trachea midline and thyroid not enlarged, symmetric, no tenderness/mass/nodules Back: symmetric, no curvature. ROM normal. No CVA tenderness.  Lungs: clear to auscultation bilaterally Heart: regular rate and rhythm, S1, S2 normal, no murmur, click, rub or gallop Abdomen: soft, obese non-tender; bowel sounds normal; no masses,  no organomegaly Pulses: 2+ and symmetric Skin: Skin color, texture, turgor normal. No rashes or lesions Lymph nodes: Cervical, supraclavicular, and axillary nodes normal.  Assessment and  Plan:  Hypertension, essential Well controlled on current regimen OF HCTZ.  Reminded to suspend HCTZ during illnesses that result in dehydration .   Obesity, unspecified I have addressed  BMI and recommended a low glycemic index diet utilizing smaller more frequent meals to increase metabolism.  I have also recommended that patient start exercising with a goal of 30 minutes of aerobic exercise a minimum of 5 days per week. Screening for lipid disorders, thyroid and diabetes to be done today.    Hyperlipidemia, mixed Last year's risk of CAD events using the AHA cardiac risk calculator was 10%.  Reviewed indications for statin therapy (risk > 12%. 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   GERD (gastroesophageal reflux disease) Managed with famotidine 20 mg qd/bid   Updated Medication List Outpatient Encounter Medications as of 04/22/2022  Medication Sig   famotidine (PEPCID) 20 MG tablet Take 1 tablet (20 mg total) by mouth 2 (two) times daily. (Patient taking differently: Take 20 mg by mouth daily.)   hydrochlorothiazide (HYDRODIURIL) 25 MG tablet TAKE 1 TABLET (25 MG TOTAL) BY MOUTH DAILY.   mometasone (ELOCON) 0.1 % cream Apply with q tip twice daily to ear canal as needed for itching   Multiple Vitamins-Minerals (MULTIVITAMIN WITH MINERALS) tablet Take 1 tablet by mouth daily.   predniSONE (DELTASONE) 10 MG tablet 6 tablets on Day 1 , then reduce by 1 tablet daily until gone   [DISCONTINUED] gabapentin (NEURONTIN) 100 MG capsule Take 1 capsule (100 mg total) by mouth 3 (three) times daily. (Patient not taking: Reported on 04/15/2021)   [DISCONTINUED] meloxicam (MOBIC) 7.5 MG tablet TAKE 1 TABLET (7.5 MG TOTAL) BY MOUTH DAILY. TAKE WITH FOOD. (Patient not taking: Reported on 04/15/2021)   No facility-administered encounter medications on file as of 04/22/2022.

## 2022-04-22 NOTE — Assessment & Plan Note (Signed)
Managed with famotidine 20 mg qd/bid

## 2022-04-22 NOTE — Assessment & Plan Note (Signed)
Last year's risk of CAD events using the AHA cardiac risk calculator was 10%.  Reviewed indications for statin therapy (risk > 12%. AA on prior films)

## 2022-04-22 NOTE — Assessment & Plan Note (Signed)
Well controlled on current regimen OF HCTZ.  Reminded to suspend HCTZ during illnesses that result in dehydration .   Lab Results  Component Value Date   CREATININE 1.01 04/22/2022   Lab Results  Component Value Date   NA 139 04/22/2022   K 3.8 04/22/2022   CL 100 04/22/2022   CO2 29 04/22/2022

## 2022-04-22 NOTE — Assessment & Plan Note (Signed)
I have addressed  BMI and recommended a low glycemic index diet utilizing smaller more frequent meals to increase metabolism.  I have also recommended that patient start exercising with a goal of 30 minutes of aerobic exercise a minimum of 5 days per week. Screening for lipid disorders, thyroid and diabetes to be done today.   

## 2022-04-24 DIAGNOSIS — R972 Elevated prostate specific antigen [PSA]: Secondary | ICD-10-CM | POA: Insufficient documentation

## 2022-04-24 NOTE — Assessment & Plan Note (Addendum)
PSA , although normal,   More than  doubled over the past year.   I am recommending an evaluation by a urologist   Lab Results  Component Value Date   PSA 3.63 04/22/2022   PSA 1.43 06/07/2021   PSA 1.54 05/29/2020

## 2022-04-24 NOTE — Assessment & Plan Note (Addendum)
His  A1c has  Suggested that he is at risk for developing diabetes.  I have  Recommended weight loss with adherence to a low a low glycemic index diet and particpation  regularly in an aerobic  exercise activity.a1c is pending .  Lab Results  Component Value Date   HGBA1C 5.9 06/07/2021

## 2022-04-24 NOTE — Assessment & Plan Note (Signed)

## 2022-04-25 LAB — HEMOGLOBIN A1C: Hgb A1c MFr Bld: 6.2 % (ref 4.6–6.5)

## 2022-04-26 NOTE — Addendum Note (Signed)
Addended by: Crecencio Mc on: 04/26/2022 10:20 AM   Modules accepted: Orders

## 2022-05-23 ENCOUNTER — Other Ambulatory Visit: Payer: BC Managed Care – PPO

## 2022-05-25 ENCOUNTER — Ambulatory Visit (INDEPENDENT_AMBULATORY_CARE_PROVIDER_SITE_OTHER): Payer: BC Managed Care – PPO | Admitting: Urology

## 2022-05-25 ENCOUNTER — Encounter: Payer: Self-pay | Admitting: Urology

## 2022-05-25 VITALS — BP 146/96 | HR 88 | Ht 68.0 in | Wt 224.0 lb

## 2022-05-25 DIAGNOSIS — R972 Elevated prostate specific antigen [PSA]: Secondary | ICD-10-CM

## 2022-05-25 NOTE — Progress Notes (Signed)
   05/25/22 11:15 AM   Tyler Peters 1960-08-13 [id number]  CC: Increased PSA velocity  HPI: 61 year old male referred for a increase in his PSA.  Baseline PSA value had been 1-1.5 over the last few years, and increased to 3.6 on 04/22/2022.  He denies any family history of prostate or breast cancer.  He denies any urinary symptoms or gross hematuria.   PMH: Past Medical History:  Diagnosis Date   Episodic recurrent vertigo    evaluated by Dr. Tami Ribas with normal MRI   Episodic recurrent vertigo    normal MRI    History of acute prostatitis Fall 2008   Hyperlipidemia, mixed    Hypertension    Testicular pain 2008   prostatis, treated with Flomax by Dr. Jacqlyn Larsen    Surgical History: Past Surgical History:  Procedure Laterality Date   COLONOSCOPY WITH PROPOFOL N/A 01/17/2018   Procedure: COLONOSCOPY WITH PROPOFOL;  Surgeon: Robert Bellow, MD;  Location: [facility] ENDOSCOPY;  Service: Endoscopy;  Laterality: N/A;   LITHOTRIPSY     NASAL SEPTUM SURGERY  2004    Family History: Family History  Problem Relation Age of Onset   Heart disease Father    Heart disease Maternal Grandmother    Diabetes Maternal Grandmother     Social History:  reports that he has never smoked. He has never been exposed to tobacco smoke. He has never used smokeless tobacco. He reports that he does not drink alcohol and does not use drugs.  Physical Exam: BP (!) 146/96   Pulse 88   Ht '5\' 8"'$  (1.727 m)   Wt 224 lb (101.6 kg)   BMI 34.06 kg/m    Constitutional:  Alert and oriented, No acute distress. Cardiovascular: No clubbing, cyanosis, or edema. Respiratory: Normal respiratory effort, no increased work of breathing. GI: Abdomen is soft, nontender, nondistended, no abdominal masses   Laboratory Data: PSA history reviewed, see HPI  Pertinent Imaging: None to review  Assessment & Plan:   61 year old male with baseline PSA of 1-1.5 with a increased to 3.6 on 04/22/2022.  We reviewed the AUA  guidelines regarding PSA screening and possible etiologies including false elevation, and, BPH, prostate cancer.  I recommended starting with a repeat PSA today with reflex to free, he denies any ejaculations over the last few days.  We discussed the limitations of DRE for detecting prostate cancer in patients with a normal PSA, and he defers DRE today.  PSA reflex to free today, call with results.->  If decreased to baseline can resume PSA screening with PCP, if greater than 4 consider prostate biopsy or MRI  Nickolas Madrid, MD 05/25/2022  [facility] [address] [place], [place] [address] [phone]

## 2022-05-25 NOTE — Patient Instructions (Signed)
Prostate Cancer Screening  Prostate cancer screening is testing that is done to check for the presence of prostate cancer in men. The prostate gland is a walnut-sized gland that is located below the bladder and in front of the rectum in males. The function of the prostate is to add fluid to semen during ejaculation. Prostate cancer is one of the most common types of cancer in men. Who should have prostate cancer screening? Screening recommendations vary based on age and other risk factors, as well as between the professional organizations who make the recommendations. In general, screening is recommended if: You are age 50 to 70 and have an average risk for prostate cancer. You should talk with your health care provider about your need for screening and how often screening should be done. Because most prostate cancers are slow growing and will not cause death, screening in this age group is generally reserved for men who have a 10- to 15-year life expectancy. You are younger than age 50, and you have these risk factors: Having a father, brother, or uncle who has been diagnosed with prostate cancer. The risk is higher if your family member's cancer occurred at an early age or if you have multiple family members with prostate cancer at an early age. Being a male who is Black or is of Caribbean or sub-Saharan African descent. In general, screening is not recommended if: You are younger than age 40. You are between the ages of 40 and 49 and you have no risk factors. You are 70 years of age or older. At this age, the risks that screening can cause are greater than the benefits that it may provide. If you are at high risk for prostate cancer, your health care provider may recommend that you have screenings more often or that you start screening at a younger age. How is screening for prostate cancer done? The recommended prostate cancer screening test is a blood test called the prostate-specific antigen  (PSA) test. PSA is a protein that is made in the prostate. As you age, your prostate naturally produces more PSA. Abnormally high PSA levels may be caused by: Prostate cancer. An enlarged prostate that is not caused by cancer (benign prostatic hyperplasia, or BPH). This condition is very common in older men. A prostate gland infection (prostatitis) or urinary tract infection. Certain medicines such as male hormones (like testosterone) or other medicines that raise testosterone levels. A rectal exam may be done as part of prostate cancer screening to help provide information about the size of your prostate gland. When a rectal exam is performed, it should be done after the PSA level is drawn to avoid any effect on the results. Depending on the PSA results, you may need more tests, such as: A physical exam to check the size of your prostate gland, if not done as part of screening. Blood and imaging tests. A procedure to remove tissue samples from your prostate gland for testing (biopsy). This is the only way to know for certain if you have prostate cancer. What are the benefits of prostate cancer screening? Screening can help to identify cancer at an early stage, before symptoms start and when the cancer can be treated more easily. There is a small chance that screening may lower your risk of dying from prostate cancer. The chance is small because prostate cancer is a slow-growing cancer, and most men with prostate cancer die from a different cause. What are the risks of prostate cancer screening? The   main risk of prostate cancer screening is diagnosing and treating prostate cancer that would never have caused any symptoms or problems. This is called overdiagnosisand overtreatment. PSA screening cannot tell you if your PSA is high due to cancer or a different cause. A prostate biopsy is the only procedure to diagnose prostate cancer. Even the results of a biopsy may not tell you if your cancer needs to  be treated. Slow-growing prostate cancer may not need any treatment other than monitoring, so diagnosing and treating it may cause unnecessary stress or other side effects. Questions to ask your health care provider When should I start prostate cancer screening? What is my risk for prostate cancer? How often do I need screening? What type of screening tests do I need? How do I get my test results? What do my results mean? Do I need treatment? Where to find more information The American Cancer Society: www.cancer.org American Urological Association: www.auanet.org Contact a health care provider if: You have difficulty urinating. You have pain when you urinate or ejaculate. You have blood in your urine or semen. You have pain in your back or in the area of your prostate. Summary Prostate cancer is a common type of cancer in men. The prostate gland is located below the bladder and in front of the rectum. This gland adds fluid to semen during ejaculation. Prostate cancer screening may identify cancer at an early stage, when the cancer can be treated more easily and is less likely to have spread to other areas of the body. The prostate-specific antigen (PSA) test is the recommended screening test for prostate cancer, but it has associated risks. Discuss the risks and benefits of prostate cancer screening with your health care provider. If you are age 59 or older, the risks that screening can cause are greater than the benefits that it may provide. This information is not intended to replace advice given to you by your health care provider. Make sure you discuss any questions you have with your health care provider. Document Revised: 01/11/2021 Document Reviewed: 01/11/2021 Elsevier Patient Education  Tyler Peters.  ---------------------------------------------------------------------------  Transrectal Ultrasound-Guided Prostate Biopsy A transrectal ultrasound-guided prostate biopsy is  a procedure to remove samples of prostate tissue for testing. The prostate is a walnut-sized gland that is located below the bladder and in front of the rectum. During this procedure, a small device (probe) is lubricated and put inside the rectum. The probe sends out sound waves that make a picture of the prostate and surrounding tissues (transrectal ultrasound). The images are used to help guide the process of removing the samples. The samples are taken to a lab to be checked for prostate cancer. This procedure is usually done to evaluate the prostate gland of men who have raised (elevated) levels of prostate-specific antigen (PSA), which can be a sign of prostate cancer or prostate enlargement related to aging (benign prostatic hyperplasia, or BPH). Tell a health care provider about: Any allergies you have. All medicines you are taking, including vitamins, herbs, eye drops, creams, and over-the-counter medicines. Any problems you or family members have had with anesthetic medicines. Any bleeding problems you have. Any surgeries you have had. Any medical conditions you have. Any prostate infections you have had. What are the risks? Generally, this is a safe procedure. However, problems may occur, including: Prostate infection. Bleeding from the rectum. Blood in the urine. Allergic reactions to medicines. Damage to surrounding structures such as blood vessels, organs, or muscles. Difficulty passing urine. Nerve  damage. This is usually temporary. What happens before the procedure? Medicines Ask your health care provider about: Changing or stopping your regular medicines. This is especially important if you are taking diabetes medicines or blood thinners. Taking medicines such as aspirin and ibuprofen. These medicines can thin your blood. Do not take these medicines unless your health care provider tells you to take them. Taking over-the-counter medicines, vitamins, herbs, and  supplements. General instructions Follow instructions from your health care provider about eating and drinking. In most instances, you will not need to stop eating and drinking completely before the procedure. You will be given an enema. During an enema, a liquid is injected into your rectum to clear out waste. You may have a blood or urine sample taken. Ask your health care provider what steps will be taken to help prevent infection. These steps may include: Washing skin with a germ-killing soap. Taking antibiotic medicine. If you will be going home right after the procedure, plan to have a responsible adult: Take you home from the hospital or clinic. You will not be allowed to drive. Care for you for the time you are told. What happens during the procedure?  An IV will be inserted into one of your veins. You will be given one or both of the following: A medicine to help you relax (sedative). A medicine to numb the area (local anesthetic). You will be placed on your left side, and your knees will be bent toward your chest. A probe with lubricated gel will be placed into your rectum, and images will be taken of your prostate and surrounding structures. Numbing medicine will be injected into your prostate. A biopsy needle will be inserted through your rectum or perineum and guided to your prostate using the ultrasound images. Prostate tissue samples will be removed, and the needle and probe will then be removed. The biopsy samples will be sent to a lab to be tested. The procedure may vary among health care providers and hospitals. What happens after the procedure? Your blood pressure, heart rate, breathing rate, and blood oxygen level will be monitored until you leave the hospital or clinic. You may have some discomfort in the rectal area. You will be given pain medicine as needed. If you were given a sedative during the procedure, it can affect you for several hours. Do not drive or  operate machinery until your health care provider says that it is safe. It is up to you to get the results of your procedure. Ask your health care provider, or the department that is doing the procedure, when your results will be ready. Keep all follow-up visits. This is important. Summary A transrectal ultrasound-guided biopsy removes samples of tissue from your prostate using ultrasound-guided sound waves to help guide the process. This procedure is usually done to evaluate the prostate gland of men who have raised (elevated) levels of prostate-specific antigen (PSA), which can be a sign of prostate cancer or prostate enlargement related to aging. After your procedure, you may feel some discomfort in the rectal area. Plan to have a responsible adult take you home from the hospital or clinic, and follow up with your health care provider for your results. This information is not intended to replace advice given to you by your health care provider. Make sure you discuss any questions you have with your health care provider. Document Revised: 01/11/2021 Document Reviewed: 01/11/2021 Elsevier Patient Education  [place].

## 2022-05-26 ENCOUNTER — Other Ambulatory Visit: Payer: Self-pay

## 2022-05-26 DIAGNOSIS — R972 Elevated prostate specific antigen [PSA]: Secondary | ICD-10-CM

## 2022-05-26 LAB — PSA TOTAL (REFLEX TO FREE): Prostate Specific Ag, Serum: 3.8 ng/mL (ref 0.0–4.0)

## 2022-07-12 NOTE — Telephone Encounter (Signed)
Error

## 2022-08-22 ENCOUNTER — Other Ambulatory Visit: Payer: BC Managed Care – PPO

## 2022-08-22 DIAGNOSIS — R972 Elevated prostate specific antigen [PSA]: Secondary | ICD-10-CM | POA: Diagnosis not present

## 2022-08-23 LAB — FPSA% REFLEX
% FREE PSA: 8.7 %
PSA, FREE: 0.41 ng/mL

## 2022-08-23 LAB — PSA TOTAL (REFLEX TO FREE): Prostate Specific Ag, Serum: 4.7 ng/mL — ABNORMAL HIGH (ref 0.0–4.0)

## 2022-08-25 ENCOUNTER — Telehealth (INDEPENDENT_AMBULATORY_CARE_PROVIDER_SITE_OTHER): Payer: BC Managed Care – PPO | Admitting: Urology

## 2022-08-25 DIAGNOSIS — R972 Elevated prostate specific antigen [PSA]: Secondary | ICD-10-CM

## 2022-08-25 MED ORDER — DIAZEPAM 5 MG PO TABS: 5.0000 mg | ORAL_TABLET | Freq: Once | ORAL | 0 refills | Status: DC | PRN

## 2022-08-25 NOTE — Progress Notes (Signed)
Virtual Visit via Telephone Note  I connected with Tyler Peters on 08/25/22 at  3:45 PM EST by telephone and verified that I am speaking with the correct person using two identifiers.   Patient location: Home Provider location: [facility]   I discussed the limitations, risks, security and privacy concerns of performing an evaluation and management service by telephone and the availability of in person appointments. We discussed the impact of the COVID-19 pandemic on the healthcare system, and the importance of social distancing and reducing patient and provider exposure. I also discussed with the patient that there may be a patient responsible charge related to this service. The patient expressed understanding and agreed to proceed.  Reason for visit: Elevated PSA  History of Present Illness: Healthy 62 year old male who has had a persistent rise in the PSA over the last few months.  He was originally referred for a PSA of 3.6 in September 2023 which had increased from his baseline of 1.5.  We repeated the PSA in October 2023 was high normal at 3.8, and he opted for a repeat PSA with reflex to free in 3 to 4 months.  Repeat PSA with reflex to free on 08/22/2022 was elevated at 4.7(8.7% free).  We reviewed the implications of an elevated PSA and the uncertainty surrounding it. In general, a man's PSA increases with age and is produced by both normal and cancerous prostate tissue. The differential diagnosis for elevated PSA includes BPH, prostate cancer, infection, recent intercourse/ejaculation, recent urethroscopic manipulation (foley placement/cystoscopy) or trauma, and prostatitis.   Management of an elevated PSA can include observation or prostate biopsy and we discussed this in detail. Our goal is to detect clinically significant prostate cancers, and manage with either active surveillance, surgery, or radiation for localized disease. Risks of prostate biopsy include bleeding,  infection (including life threatening sepsis), pain, and lower urinary symptoms. Hematuria, hematospermia, and blood in the stool are all common after biopsy and can persist up to 4 weeks.   We also reviewed the role of prostate MRI.  With his young age and good health, I think it is reasonable to proceed straight to prostate biopsy.  Risk and benefits discussed, and we will schedule   Follow Up: Schedule prostate biopsy, Valium sent to pharmacy   I discussed the assessment and treatment plan with the patient. The patient was provided an opportunity to ask questions and all were answered. The patient agreed with the plan and demonstrated an understanding of the instructions.   The patient was advised to call back or seek an in-person evaluation if the symptoms worsen or if the condition fails to improve as anticipated.  I provided 12 minutes of non-face-to-face time during this encounter.   Billey Co, MD

## 2022-08-29 ENCOUNTER — Telehealth: Payer: Self-pay

## 2022-08-29 NOTE — Telephone Encounter (Signed)
Called pt scheduled him for prostate biopsy. Reviewed prostate biopsy instructions with pt in depth. Copy of instructions sent to pt via mychart. All questions answered.

## 2022-08-29 NOTE — Telephone Encounter (Signed)
-----  Message from Billey Co, MD sent at 08/25/2022  2:46 PM EST ----- Regarding: schedule prostate bx Please review prostate biopsy instructions, and schedule prostate biopsy.  I sent a Valium to pharmacy  Nickolas Madrid, MD 08/25/2022

## 2022-09-08 ENCOUNTER — Ambulatory Visit (INDEPENDENT_AMBULATORY_CARE_PROVIDER_SITE_OTHER): Payer: BC Managed Care – PPO | Admitting: Urology

## 2022-09-08 VITALS — BP 129/90 | HR 106 | Wt 224.0 lb

## 2022-09-08 DIAGNOSIS — C61 Malignant neoplasm of prostate: Secondary | ICD-10-CM

## 2022-09-08 DIAGNOSIS — R972 Elevated prostate specific antigen [PSA]: Secondary | ICD-10-CM | POA: Diagnosis not present

## 2022-09-08 MED ORDER — GENTAMICIN SULFATE 40 MG/ML IJ SOLN
80.0000 mg | Freq: Once | INTRAMUSCULAR | Status: AC
  Administered 2022-09-08: 80 mg via INTRAMUSCULAR

## 2022-09-08 MED ORDER — LEVOFLOXACIN 500 MG PO TABS
500.0000 mg | ORAL_TABLET | Freq: Once | ORAL | Status: AC
  Administered 2022-09-08: 500 mg via ORAL

## 2022-09-08 NOTE — Patient Instructions (Signed)
Transrectal Ultrasound-Guided Prostate Biopsy, Care After What can I expect after the procedure? After the procedure, it is common to have: Pain and discomfort near your butt (rectum), especially while sitting. Pink-colored pee (urine). This is due to small amounts of blood in your pee. A burning feeling while peeing. Blood in your poop (stool). Bleeding from your butt. Blood in your semen. Follow these instructions at home: Medicines Take over-the-counter and prescription medicines only as told by your doctor. If you were given a sedative during your procedure, do not drive or use machines until your doctor says that it is safe. A sedative is a medicine that helps you relax. If you were prescribed an antibiotic medicine, take it as told by your doctor. Do not stop taking it even if you start to feel better. Activity  Return to your normal activities when your doctor says that it is safe. Ask your doctor when it is okay for you to have sex. You may have to avoid lifting. Ask your doctor how much you can safely lift. General instructions  Drink enough water to keep your pee pale yellow. Watch your pee, poop, and semen for new bleeding or bleeding that gets worse. Keep all follow-up visits. Contact a doctor if: You have any of these: Blood clots in your pee or poop. Blood in your pee more than 2 weeks after the procedure. Blood in your semen more than 2 months after the procedure. New or worse bleeding in your pee, poop, or semen. Very bad belly pain. Your pee smells bad or unusual. You have trouble peeing. Your lower belly feels firm. You have problems getting an erection. You feel like you may vomit (are nauseous), or you vomit. Get help right away if: You have a fever or chills. You have bright red pee. You have very bad pain that does not get better with medicine. You cannot pee. Summary After this procedure, it is common to have pain and discomfort near your butt,  especially while sitting. You may have blood in your pee and poop. It is common to have blood in your semen. Get help right away if you have a fever or chills. This information is not intended to replace advice given to you by your health care provider. Make sure you discuss any questions you have with your health care provider. Document Revised: 01/11/2021 Document Reviewed: 01/11/2021 Elsevier Patient Education  2023 Elsevier Inc.  

## 2022-09-08 NOTE — Progress Notes (Signed)
   09/08/22  Indication: Elevated PSA, 4.7(8.7% free)  Prostate Biopsy Procedure   Informed consent was obtained, and we discussed the risks of bleeding and infection/sepsis. A time out was performed to ensure correct patient identity.  Pre-Procedure:  - Gentamicin and levaquin given for antibiotic prophylaxis - Transrectal Ultrasound performed revealing a 24 gm prostate, PSA density 0.20 - No significant hypoechoic or median lobe noted  Procedure: - Prostate block performed using 10 cc 1% lidocaine and biopsies taken from sextant areas, a total of 12 under ultrasound guidance.  Post-Procedure: - Patient tolerated the procedure well - He was counseled to seek immediate medical attention if experiences significant bleeding, fevers, or severe pain - Return in one week to discuss biopsy results  Assessment/ Plan: Will follow up in 1-2 weeks to discuss pathology  Nickolas Madrid, MD 09/08/2022

## 2022-09-08 NOTE — Addendum Note (Signed)
Addended by: Donalee Citrin on: 09/08/2022 09:59 AM   Modules accepted: Orders

## 2022-09-09 LAB — SURGICAL PATHOLOGY

## 2022-09-11 ENCOUNTER — Other Ambulatory Visit: Payer: Self-pay | Admitting: Family

## 2022-09-11 DIAGNOSIS — I10 Essential (primary) hypertension: Secondary | ICD-10-CM

## 2022-09-14 ENCOUNTER — Ambulatory Visit (INDEPENDENT_AMBULATORY_CARE_PROVIDER_SITE_OTHER): Payer: BC Managed Care – PPO | Admitting: Urology

## 2022-09-14 VITALS — BP 151/104 | HR 92

## 2022-09-14 DIAGNOSIS — R972 Elevated prostate specific antigen [PSA]: Secondary | ICD-10-CM

## 2022-09-14 NOTE — Progress Notes (Signed)
   09/14/2022 4:18 PM   Tyler Peters 07-02-1961 [id number]  Reason for visit: Follow up prostate biopsy  HPI: 62 year old male who presented with a PSA of 4.7 and underwent a prostate biopsy on 09/08/2022 that showed a 24 g prostate with PSA density of 0.2.  There was concern from the lab that his prostate biopsy specimen was potentially mixed up with a patient who also had a biopsy on the same day.  One of the biopsies showed aggressive prostate cancer, and the other showed a single core of low risk prostate cancer.  I had a very frank and honest conversation with the patient today about the potential mixup in the lab regarding specimen labeling.  After discussing with pathologist Dr. Quay Burow, she recommended reaching out to [facility] for genetic testing to clarify the specimens.  I reviewed this with the patient.  We discussed that his pathology may or may not have been exchanged, and the only way to know for sure is with the genetic testing for clarification.  We also briefly reviewed different treatment strategies for prostate cancer, and that if he was found to have the low risk specimen with a single core of Gleason score 3+3 =6, would likely recommend active surveillance, with consideration of a repeat biopsy within the next 2 years, and PSA monitoring.  However, if his specimen truly is the high risk Gleason score 7 and 8 prostate cancer, would need to pursue PSMA PET scan or CT/bone scan for evaluation of metastatic disease, and I found that be localized would consider surgery or radiation for definitive treatment.  Blood drawn today for genetic testing to clarify prostate biopsy specimen, will call with results  Billey Co, [facility] [address] [place], [place] [address] [phone]

## 2022-10-11 ENCOUNTER — Other Ambulatory Visit: Payer: Self-pay | Admitting: Urology

## 2022-10-11 DIAGNOSIS — C61 Malignant neoplasm of prostate: Secondary | ICD-10-CM

## 2022-10-11 NOTE — Telephone Encounter (Signed)
62 year old male with elevated PSA of 4.7, underwent prostate biopsy showing a 24 g gland, PSA density 0.2.  Biopsy showed 6/12 cores with prostate cancer, primarily intermediate risk disease, however 1 core of high risk 4+4=8 disease.  There was some concern about potential mixup with his pathology on original biopsy, and DNA test was sent for confirmation, which confirmed the above results.  We had a lengthy conversation today about the patient's new diagnosis of prostate cancer.  We reviewed the risk classifications per the AUA guidelines including very low risk, low risk, intermediate risk, and high risk disease, and the need for additional staging imaging with CT and bone scan in patients with unfavorable intermediate risk and high risk disease.  I explained that his life expectancy, clinical stage, Gleason score, PSA, and other co-morbidities influence treatment strategies.  We discussed the roles of active surveillance, radiation therapy, surgical therapy with robotic prostatectomy, and hormone therapy with androgen deprivation.  We discussed that patients urinary symptoms also impact treatment strategy, as patients with severe lower urinary tract symptoms may have significant worsening or even develop urinary retention after undergoing radiation.  In regards to surgery, we discussed robotic prostatectomy +/- lymphadenectomy at length.  The procedure takes 3 to 4 hours, and patient's typically discharge home on post-op day #1.  A Foley catheter is left in place for 7 to 10 days to allow for healing of the vesicourethral anastomosis.  There is a small risk of bleeding, infection, damage to surrounding structures or bowel, hernia, DVT/PE, or serious cardiac or pulmonary complications.  We discussed at length post-op side effects including erectile dysfunction, and the importance of pre-operative erectile function on long-term outcomes.  Even with a nerve sparing approach, there is an approximately 25%  rate of permanent erectile dysfunction.  We also discussed postop urinary incontinence at length.  We expect patients to have stress incontinence post-operatively that will improve over period of weeks to months.  Less than 10% of men will require a pad at 1 year after surgery.  Patients will need to avoid heavy lifting and strenuous activity for 3 to 4 weeks, but most men return to their baseline activity status by 6 weeks.  CT and bone scan ordered, call with results Referral placed to radiation oncology Anticipate likely surgery with his young age and high risk disease  Nickolas Madrid, MD 10/11/2022

## 2022-10-19 ENCOUNTER — Ambulatory Visit: Payer: BC Managed Care – PPO | Admitting: Radiation Oncology

## 2022-10-20 ENCOUNTER — Encounter
Admission: RE | Admit: 2022-10-20 | Discharge: 2022-10-20 | Disposition: A | Discharge status: Home / Self Care | Payer: BC Managed Care – PPO | Source: Ambulatory Visit | Attending: Urology | Admitting: Urology

## 2022-10-20 DIAGNOSIS — C61 Malignant neoplasm of prostate: Secondary | ICD-10-CM | POA: Insufficient documentation

## 2022-10-20 MED ORDER — TECHNETIUM TC 99M MEDRONATE IV KIT
22.3300 | PACK | Freq: Once | INTRAVENOUS | Status: AC | PRN
  Administered 2022-10-20: 22.33 via INTRAVENOUS

## 2022-10-25 ENCOUNTER — Ambulatory Visit
Admission: RE | Admit: 2022-10-25 | Discharge: 2022-10-25 | Disposition: A | Discharge status: Home / Self Care | Payer: BC Managed Care – PPO | Source: Ambulatory Visit | Attending: Urology | Admitting: Urology

## 2022-10-25 DIAGNOSIS — C61 Malignant neoplasm of prostate: Secondary | ICD-10-CM | POA: Diagnosis not present

## 2022-10-25 DIAGNOSIS — N200 Calculus of kidney: Secondary | ICD-10-CM | POA: Diagnosis not present

## 2022-10-25 DIAGNOSIS — K5730 Diverticulosis of large intestine without perforation or abscess without bleeding: Secondary | ICD-10-CM | POA: Diagnosis not present

## 2022-10-25 LAB — POCT I-STAT CREATININE: Creatinine, Ser: 1.2 mg/dL (ref 0.61–1.24)

## 2022-10-25 MED ORDER — IOHEXOL 300 MG/ML  SOLN
100.0000 mL | Freq: Once | INTRAMUSCULAR | Status: AC | PRN
  Administered 2022-10-25: 100 mL via INTRAVENOUS

## 2022-10-27 ENCOUNTER — Other Ambulatory Visit: Payer: Self-pay | Admitting: Urology

## 2022-10-27 ENCOUNTER — Encounter: Payer: Self-pay | Admitting: Radiation Oncology

## 2022-10-27 ENCOUNTER — Ambulatory Visit
Admission: RE | Admit: 2022-10-27 | Discharge: 2022-10-27 | Disposition: A | Discharge status: Home / Self Care | Payer: BC Managed Care – PPO | Source: Ambulatory Visit | Attending: Radiation Oncology | Admitting: Radiation Oncology

## 2022-10-27 VITALS — BP 147/104 | HR 87 | Temp 98.3°F | Resp 16 | Ht 68.0 in | Wt 234.0 lb

## 2022-10-27 DIAGNOSIS — E785 Hyperlipidemia, unspecified: Secondary | ICD-10-CM | POA: Diagnosis not present

## 2022-10-27 DIAGNOSIS — I10 Essential (primary) hypertension: Secondary | ICD-10-CM | POA: Diagnosis not present

## 2022-10-27 DIAGNOSIS — C61 Malignant neoplasm of prostate: Secondary | ICD-10-CM | POA: Diagnosis not present

## 2022-10-27 DIAGNOSIS — Z923 Personal history of irradiation: Secondary | ICD-10-CM | POA: Insufficient documentation

## 2022-10-27 DIAGNOSIS — Z191 Hormone sensitive malignancy status: Secondary | ICD-10-CM | POA: Diagnosis not present

## 2022-10-27 DIAGNOSIS — Z79899 Other long term (current) drug therapy: Secondary | ICD-10-CM | POA: Insufficient documentation

## 2022-10-27 NOTE — Consult Note (Signed)
NEW PATIENT EVALUATION  Name: Tyler Peters  MRN: [id number]  Date:   10/27/2022     DOB: [birth year]   This 62 y.o. 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  4.7  REFERRING PHYSICIAN: Crecencio Mc, MD  CHIEF COMPLAINT:  Chief Complaint  Patient presents with   Prostate Cancer    DIAGNOSIS: The encounter diagnosis was Malignant neoplasm of prostate ([place]).   PREVIOUS INVESTIGATIONS:  Pathology report reviewed Clinical notes reviewed CT scan and bone scan reviewed   HPI: Patient is a 62 year old male who had an accelerated velocity of rising of his PSA up to 4.7.  This prompted transrectal ultrasound-guided biopsy showing mostly Gleason 7 (4+3) adenocarcinoma although there was 1 core positive for Gleason 8 (4+4).  There was some confusion as pathology report although genetic studies showed that the Gleason 8 was actually genetically linked to him.  He did have 6 of 12 cores positive.  He is asymptomatic specifically denies any increased lower urinary tract symptoms change in bowel pattern or bone pain.  He has had a CT scan which has not been formally read but I do not see any evidence of pelvic adenopathy or metastatic disease or locally advanced disease in his pelvis.  He also had a bone scan showing no evidence of osseous metastatic disease.  Treatment options including robotic assisted prostatectomy have been discussed with the patient is now referred to radiation oncology for consideration and opinion.  PLANNED TREATMENT REGIMEN: Robotic assisted prostatectomy  PAST MEDICAL HISTORY:  has a past medical history of Episodic recurrent vertigo, Episodic recurrent vertigo, History of acute prostatitis (Fall 2008), Hyperlipidemia, mixed, Hypertension, and Testicular pain (2008).    PAST SURGICAL HISTORY:  Past Surgical History:  Procedure Laterality Date    COLONOSCOPY WITH PROPOFOL N/A 01/17/2018   Procedure: COLONOSCOPY WITH PROPOFOL;  Surgeon: Robert Bellow, MD;  Location: [facility] ENDOSCOPY;  Service: Endoscopy;  Laterality: N/A;   LITHOTRIPSY     NASAL SEPTUM SURGERY  2004    FAMILY HISTORY: family history includes Diabetes in his maternal grandmother; Heart disease in his father and maternal grandmother.  SOCIAL HISTORY:  reports that he has never smoked. He has never been exposed to tobacco smoke. He has never used smokeless tobacco. He reports that he does not drink alcohol and does not use drugs.  ALLERGIES: Tramadol hcl  MEDICATIONS:  Current Outpatient Medications  Medication Sig Dispense Refill   famotidine (PEPCID) 20 MG tablet Take 1 tablet (20 mg total) by mouth 2 (two) times daily. (Patient taking differently: Take 20 mg by mouth daily.) 90 tablet 1   hydrochlorothiazide (HYDRODIURIL) 25 MG tablet TAKE 1 TABLET (25 MG TOTAL) BY MOUTH DAILY. 90 tablet 1   Multiple Vitamins-Minerals (MULTIVITAMIN WITH MINERALS) tablet Take 1 tablet by mouth daily.     No current facility-administered medications for this encounter.    ECOG PERFORMANCE STATUS:  0 - Asymptomatic  REVIEW OF SYSTEMS: Patient denies any weight loss, fatigue, weakness, fever, chills or night sweats. Patient denies any loss of vision, blurred vision. Patient denies any ringing  of the ears or hearing loss. No irregular heartbeat. Patient denies heart murmur or history of fainting. Patient denies any chest pain or pain radiating to her upper extremities. Patient denies any shortness of breath, difficulty breathing at night, cough or hemoptysis. Patient denies any swelling in the lower legs.  Patient denies any nausea vomiting, vomiting of blood, or coffee ground material in the vomitus. Patient denies any stomach pain. Patient states has had normal bowel movements no significant constipation or diarrhea. Patient denies any dysuria, hematuria or significant nocturia.  Patient denies any problems walking, swelling in the joints or loss of balance. Patient denies any skin changes, loss of hair or loss of weight. Patient denies any excessive worrying or anxiety or significant depression. Patient denies any problems with insomnia. Patient denies excessive thirst, polyuria, polydipsia. Patient denies any swollen glands, patient denies easy bruising or easy bleeding. Patient denies any recent infections, allergies or URI. Patient "s visual fields have not changed significantly in recent time.   PHYSICAL EXAM: BP (!) 147/104 Comment: Patient advised to monitor at home if BP remains elvated ,  Pulse 87   Temp 98.3 F (36.8 C) (Tympanic)   Resp 16   Ht 5\' 8"  (1.727 m) Comment: stated HT  Wt 234 lb (106.1 kg)   BMI 35.58 kg/m  Well-developed well-nourished patient in NAD. HEENT reveals PERLA, EOMI, discs not visualized.  Oral cavity is clear. No oral mucosal lesions are identified. Neck is clear without evidence of cervical or supraclavicular adenopathy. Lungs are clear to A&P. Cardiac examination is essentially unremarkable with regular rate and rhythm without murmur rub or thrill. Abdomen is benign with no organomegaly or masses noted. Motor sensory and DTR levels are equal and symmetric in the upper and lower extremities. Cranial nerves II through XII are grossly intact. Proprioception is intact. No peripheral adenopathy or edema is identified. No motor or sensory levels are noted. Crude visual fields are within normal range.  LABORATORY DATA: Pathology reports reviewed    RADIOLOGY RESULTS: CT scan and bone scan reviewed compatible with above-stated findings   IMPRESSION: Stage IIc adenocarcinoma of the prostate mostly Gleason 7 (61+54) in 62 year old male  PLAN: At this time of gone over recommendations as far as radiation versus surgical resection.  He is young and healthy and I have explained to him we tend to push these patients towards prostatectomy with his  known stage disease.  I have run the [facility] nomogram showing less than 10% lymph node involvement.  I explained to him prostate implant and external beam radiation therapy would be a secondary option and based on using radiation therapy as the primary modality of treatment there would be no option for salvage should he have a biochemical failure or progression of disease in the future.  The converse is true with surgery should he have biochemical failure we can always do salvage radiation therapy with excellent results.  Patient is leaning toward surgical resection with a robotic assisted prostatectomy and I will forward that information to urology.  Patient is to call with any concerns or questions in the future.  I would like to take this opportunity to thank you for allowing me to participate in the care of your patient.Noreene Filbert, MD

## 2022-10-27 NOTE — Progress Notes (Signed)
Surgical Physician Order Form [facility]  * Scheduling expectation : Next Available  *Length of Case: 4 hours  *Clearance needed: no  *Anticoagulation Instructions: Hold all anticoagulants  *Aspirin Instructions: Hold Aspirin  *Post-op visit Date/Instructions:  1 week cath removal  *Diagnosis: Prostate Cancer  *Procedure:     Robotic laparoscopic Prostatectomy [id number]) and bilateral lymph node dissection   Additional orders:  Subcutaneous heparin 5000 units  -Admit type: Observation  -Anesthesia: General  -VTE Prophylaxis Standing Order SCD's       Other:   -Standing Lab Orders Per Anesthesia    Lab other: Type&Screen  -Standing Test orders EKG/Chest x-ray per Anesthesia       Test other:   - Medications:  Ancef 2gm IV  -Other orders:  N/A

## 2022-10-31 ENCOUNTER — Telehealth: Payer: Self-pay

## 2022-10-31 NOTE — Telephone Encounter (Signed)
Spoke with pt. Pt. Advised of all results and have scheduled surgery for 12/12/2022.

## 2022-10-31 NOTE — Progress Notes (Signed)
   [facility] Surgical Posting Form  Surgery Date: Date: 12/12/2022  Surgeon: Dr. Nickolas Madrid, MD  Inpt ( No  )   Outpt (No)   Obs ( Yes  )   Diagnosis: C61 Prostate Cancer   -CPT: [phone], [phone]  Surgery: Robotic Laparoscopic Radical Prostatectomy and Bilateral Pelvic Lymph Node Dissection  Stop Anticoagulations: Yes and will also need to hold ASA  Cardiac/Medical/Pulmonary Clearance needed: no  *Orders entered into EPIC  Date: 10/31/22   *Case booked in EPIC  Date: 10/31/22  *Notified pt of Surgery: Date: 10/31/22  PRE-OP UA & CX: no, will obtain CBC, CMP, INR, T&S  *Placed into Prior Authorization Work [place] Date: 10/31/22  Assistant/laser/rep:Yes, Dr. Hollice Espy, MD will also be on this case.

## 2022-10-31 NOTE — Telephone Encounter (Signed)
-----   Message from Billey Co, MD sent at 10/27/2022 12:16 PM EDT ----- Good news, no evidence of metastatic disease on CT or bone scan, okay to schedule radical prostatectomy, order sent  Nickolas Madrid, MD 10/27/2022

## 2022-10-31 NOTE — Telephone Encounter (Signed)
I spoke with Mr. Tyler Peters. We have discussed possible surgery dates and Monday May 13th, 2024 was agreed upon by all parties. Patient given information about surgery date, what to expect pre-operatively and post operatively.  We discussed that a Pre-Admission Testing office will be calling to set up the pre-op visit that will take place prior to surgery, and that these appointments are typically done over the phone with a Pre-Admissions RN. Informed patient that our office will communicate any additional care to be provided after surgery. Patients questions or concerns were discussed during our call. Advised to call our office should there be any additional information, questions or concerns that arise. Patient verbalized understanding.

## 2022-12-02 ENCOUNTER — Encounter
Admission: RE | Admit: 2022-12-02 | Discharge: 2022-12-02 | Disposition: A | Discharge status: Home / Self Care | Payer: BC Managed Care – PPO | Source: Ambulatory Visit | Attending: Urology | Admitting: Urology

## 2022-12-02 ENCOUNTER — Other Ambulatory Visit: Payer: Self-pay

## 2022-12-02 DIAGNOSIS — I10 Essential (primary) hypertension: Secondary | ICD-10-CM

## 2022-12-02 HISTORY — DX: Other complications of anesthesia, initial encounter: T88.59XA

## 2022-12-02 HISTORY — DX: Gastro-esophageal reflux disease without esophagitis: K21.9

## 2022-12-02 HISTORY — DX: Personal history of urinary calculi: Z87.442

## 2022-12-02 HISTORY — DX: Sleep apnea, unspecified: G47.30

## 2022-12-02 HISTORY — DX: Malignant (primary) neoplasm, unspecified: C80.1

## 2022-12-02 NOTE — Patient Instructions (Addendum)
Your procedure is scheduled on: 12/12/22 - Monday Report to the Registration Desk on the 1st floor of the [facility]. To find out your arrival time, please call [phone] between 1PM - 3PM on: 12/09/22 - Friday If your arrival time is 6:00 am, do not arrive before that time as the [facility] entrance doors do not open until 6:00 am.  REMEMBER: Instructions that are not followed completely may result in serious medical risk, up to and including death; or upon the discretion of your surgeon and anesthesiologist your surgery may need to be rescheduled.  Do not eat food after midnight the night before surgery.  No gum chewing or hard candies.   One week prior to surgery: Stop Anti-inflammatories (NSAIDS) such as Advil, Aleve, Ibuprofen, Motrin, Naproxen, Naprosyn and Aspirin based products such as Excedrin, Goody's Powder, BC Powder.  Stop ANY OVER THE COUNTER supplements until after surgery.  You may however, continue to take Tylenol if needed for pain up until the day of surgery.  TAKE ONLY THESE MEDICATIONS THE MORNING OF SURGERY WITH A SIP OF WATER:  famotidine (PEPCID) - (take one the night before and one on the morning of surgery - helps to prevent nausea after surgery.)   No Alcohol for 24 hours before or after surgery.  No Smoking including e-cigarettes for 24 hours before surgery.  No chewable tobacco products for at least 6 hours before surgery.  No nicotine patches on the day of surgery.  Do not use any "recreational" drugs for at least a week (preferably 2 weeks) before your surgery.  Please be advised that the combination of cocaine and anesthesia may have negative outcomes, up to and including death. If you test positive for cocaine, your surgery will be cancelled.  On the morning of surgery brush your teeth with toothpaste and water, you may rinse your mouth with mouthwash if you wish. Do not swallow any toothpaste or mouthwash.  Use CHG Soap or wipes as  directed on instruction sheet.  Do not wear jewelry, make-up, hairpins, clips or nail polish.  Do not wear lotions, powders, or perfumes.   Do not shave body hair from the neck down 48 hours before surgery.  Contact lenses, hearing aids and dentures may not be worn into surgery.  Do not bring valuables to the hospital. [facility] is not responsible for any missing/lost belongings or valuables.   Notify your doctor if there is any change in your medical condition (cold, fever, infection).  Wear comfortable clothing (specific to your surgery type) to the hospital.  After surgery, you can help prevent lung complications by doing breathing exercises.  Take deep breaths and cough every 1-2 hours. Your doctor may order a device called an Incentive Spirometer to help you take deep breaths. When coughing or sneezing, hold a pillow firmly against your incision with both hands. This is called "splinting." Doing this helps protect your incision. It also decreases belly discomfort.  If you are being admitted to the hospital overnight, leave your suitcase in the car. After surgery it may be brought to your room.  In case of increased patient census, it may be necessary for you, the patient, to continue your postoperative care in the Same Day Surgery department.  If you are being discharged the day of surgery, you will not be allowed to drive home. You will need a responsible individual to drive you home and stay with you for 24 hours after surgery.   If you are taking  public transportation, you will need to have a responsible individual with you.  Please call the Pre-admissions Testing Dept. at [phone] if you have any questions about these instructions.  Surgery Visitation Policy:  Patients having surgery or a procedure may have two visitors.  Children under the age of 62 must have an adult with them who is not the patient.  Inpatient Visitation:    Visiting hours are 7 a.m. to 8  p.m. Up to four visitors are allowed at one time in a patient room. The visitors may rotate out with other people during the day.  One visitor age 17 or older may stay with the patient overnight and must be in the room by 8 p.m.

## 2022-12-05 ENCOUNTER — Encounter
Admission: RE | Admit: 2022-12-05 | Discharge: 2022-12-05 | Disposition: A | Discharge status: Home / Self Care | Payer: BC Managed Care – PPO | Source: Ambulatory Visit | Attending: Urology | Admitting: Urology

## 2022-12-05 DIAGNOSIS — Z01812 Encounter for preprocedural laboratory examination: Secondary | ICD-10-CM

## 2022-12-05 DIAGNOSIS — C61 Malignant neoplasm of prostate: Secondary | ICD-10-CM | POA: Diagnosis not present

## 2022-12-05 DIAGNOSIS — Z79899 Other long term (current) drug therapy: Secondary | ICD-10-CM

## 2022-12-05 DIAGNOSIS — Z01818 Encounter for other preprocedural examination: Secondary | ICD-10-CM | POA: Diagnosis not present

## 2022-12-05 DIAGNOSIS — E876 Hypokalemia: Secondary | ICD-10-CM

## 2022-12-05 DIAGNOSIS — I10 Essential (primary) hypertension: Secondary | ICD-10-CM | POA: Insufficient documentation

## 2022-12-05 LAB — TYPE AND SCREEN
ABO/RH(D): B POS
Antibody Screen: NEGATIVE

## 2022-12-05 LAB — COMPREHENSIVE METABOLIC PANEL
ALT: 21 U/L (ref 0–44)
AST: 25 U/L (ref 15–41)
Albumin: 4.1 g/dL (ref 3.5–5.0)
Alkaline Phosphatase: 50 U/L (ref 38–126)
Anion gap: 12 (ref 5–15)
BUN: 17 mg/dL (ref 6–20)
CO2: 26 mmol/L (ref 22–32)
Calcium: 9.2 mg/dL (ref 8.9–10.3)
Chloride: 102 mmol/L (ref 98–111)
Creatinine, Ser: 1.08 mg/dL (ref 0.61–1.24)
GFR, Estimated: >60 mL/min (ref >60)
Glucose, Bld: 115 mg/dL — ABNORMAL HIGH (ref 70–99)
Potassium: 3.2 mmol/L — ABNORMAL LOW (ref 3.5–5.1)
Sodium: 140 mmol/L (ref 135–145)
Total Bilirubin: 0.7 mg/dL (ref 0.3–1.2)
Total Protein: 7.3 g/dL (ref 6.5–8.1)

## 2022-12-05 LAB — CBC
HCT: 47.4 % (ref 39.0–52.0)
Hemoglobin: 15.9 g/dL (ref 13.0–17.0)
MCH: 28.9 pg (ref 26.0–34.0)
MCHC: 33.5 g/dL (ref 30.0–36.0)
MCV: 86 fL (ref 80.0–100.0)
Platelets: 282 10*3/uL (ref 150–400)
RBC: 5.51 MIL/uL (ref 4.22–5.81)
RDW: 13.2 % (ref 11.5–15.5)
WBC: 5.3 10*3/uL (ref 4.0–10.5)
nRBC: 0 % (ref 0.0–0.2)

## 2022-12-05 LAB — PROTIME-INR
INR: 1 (ref 0.8–1.2)
Prothrombin Time: 13.3 seconds (ref 11.4–15.2)

## 2022-12-05 MED ORDER — POTASSIUM CHLORIDE ER 10 MEQ PO TBCR
EXTENDED_RELEASE_TABLET | ORAL | 0 refills | Status: DC
Start: 2022-12-05 — End: 2023-01-26

## 2022-12-05 NOTE — Progress Notes (Signed)
[facility] Perioperative Services: Pre-Admission/Anesthesia Testing  Abnormal Lab Notification and Treatment Plan of Care   Date: 12/05/22  Name: Tyler Peters MRN:   [id number]  Re: Abnormal labs noted during PAT appointment   Notified:  Provider Name Provider Role Notification Mode  Legrand Rams, MD Urology (Surgeon) Routed and/or faxed via Stacie Acres, MD Primary Care Provider Routed and/or faxed via [facility]   Clinical Information and Notes:  ABNORMAL LAB VALUE(S): Lab Results  Component Value Date   K 3.2 (L) 12/05/2022   Tyler Peters is scheduled for a XI ROBOTIC ASSISTED LAPAROSCOPIC RADICAL PROSTATECTOMY; PELVIC LYMPH NODE DISSECTION on 12/12/2022. In review of his medication reconciliation, it is noted that the patient is taking prescribed diuretic medications (HCTZ 25 mg) daily.   Please note, in efforts to promote a safe and effective anesthetic course, per current guidelines/standards set by the [facility] anesthesia team, the minimal acceptable K+ level for the patient to proceed with general anesthesia is 3.0 mmol/L. With that being said, if the patient drops any lower, his elective procedure will need to be postponed until K+ is better optimized. In efforts to prevent case cancellation, will make efforts to optimize pre-surgical K+ level so that patient can safely undergo the planned surgical intervention.   Impression and Plan:  Tyler Peters found to be HYPOkalemic at 3.2 mmol/L on preoperative labs. He is on thiazide diuretic therapy. He is not on K+ supplementation. Contacted patient to discuss results and plans for correction of noted electrolyte derangement as follows:  Meds ordered this encounter  Medications   potassium chloride (KLOR-CON) 10 MEQ tablet    Sig: Take 2 tablets (20 mEq) today, then 1 tablet (10 mEq) daily until surgery. Be sure to take dose on day of surgery. Follow up with PCP for repeat labs.    Dispense:  9 tablet     Refill:  0    Please contact patient once filled and ready for pick up. This Rx is for preop K+ optimization.   Encouraged patient to follow up with PCP about 2-3 weeks postoperatively to have labs rechecked to ensure that levels are remaining within normal range. Discussed nutritional intake of K+ rich foods as an adjunctive way to keep his K+ levels normal; list of K+ rich foods verbally provided. Also mentioned ORS, however advised him not to rely solely on these drinks, as they are high in Na+, and he has a HTN diagnosis.   Will send copy of this note to surgeon and PCP to make them aware of K+ level and plans for correction. Discussed that PCP may elect to pursue a change in diuretic therapy to a K+ sparing type medication, or alternatively, they may consider adding a daily K+ supplement if K+ levels remain now on recheck. Order entered to recheck K+ on the day of his surgery to ensure optimization. Wished patient the best of luck with his upcoming surgery and subsequent recovery. He was encouraged to return call to the PAT clinic, or to his surgeon's office, should any questions or concerns arise between now and the time of his surgery.   Encounter Diagnoses  Name Primary?   Pre-operative laboratory examination Yes   Diuretic-induced hypokalemia    Long term current use of diuretic    Quentin Mulling, MSN, APRN, FNP-C, CEN [facility]  Peri-operative Services Nurse Practitioner Phone: [phone] 12/05/22 1:45 PM  NOTE: This note has been prepared using Dragon dictation  software. Despite my best ability to proofread, there is always the potential that unintentional transcriptional errors may still occur from this process.

## 2022-12-11 MED ORDER — HEPARIN SODIUM (PORCINE) 5000 UNIT/ML IJ SOLN
5000.0000 [IU] | INTRAMUSCULAR | Status: AC
  Administered 2022-12-12: 5000 [IU] via SUBCUTANEOUS

## 2022-12-11 MED ORDER — LACTATED RINGERS IV SOLN: INTRAVENOUS | Status: DC

## 2022-12-11 MED ORDER — CHLORHEXIDINE GLUCONATE 0.12 % MT SOLN
15.0000 mL | Freq: Once | OROMUCOSAL | Status: AC
  Administered 2022-12-12: 15 mL via OROMUCOSAL

## 2022-12-11 MED ORDER — CHLORHEXIDINE GLUCONATE CLOTH 2 % EX PADS: 6.0000 | MEDICATED_PAD | Freq: Once | CUTANEOUS | Status: DC

## 2022-12-11 MED ORDER — ORAL CARE MOUTH RINSE: 15.0000 mL | Freq: Once | OROMUCOSAL | Status: AC

## 2022-12-11 MED ORDER — CEFAZOLIN SODIUM-DEXTROSE 2-4 GM/100ML-% IV SOLN
2.0000 g | INTRAVENOUS | Status: AC
  Administered 2022-12-12: 2 g via INTRAVENOUS

## 2022-12-12 ENCOUNTER — Ambulatory Visit: Payer: BC Managed Care – PPO | Admitting: Urgent Care

## 2022-12-12 ENCOUNTER — Other Ambulatory Visit: Payer: Self-pay

## 2022-12-12 ENCOUNTER — Encounter: Payer: Self-pay | Admitting: Urology

## 2022-12-12 ENCOUNTER — Observation Stay
Admission: RE | Admit: 2022-12-12 | Discharge: 2022-12-13 | Disposition: A | Discharge status: Home / Self Care | Payer: BC Managed Care – PPO | Attending: Urology | Admitting: Urology

## 2022-12-12 ENCOUNTER — Encounter
Admission: RE | Disposition: A | Discharge status: Home / Self Care | Payer: Self-pay | Source: Home / Self Care | Attending: Urology

## 2022-12-12 DIAGNOSIS — D219 Benign neoplasm of connective and other soft tissue, unspecified: Secondary | ICD-10-CM | POA: Diagnosis not present

## 2022-12-12 DIAGNOSIS — Z79899 Other long term (current) drug therapy: Secondary | ICD-10-CM | POA: Diagnosis not present

## 2022-12-12 DIAGNOSIS — I10 Essential (primary) hypertension: Secondary | ICD-10-CM | POA: Diagnosis not present

## 2022-12-12 DIAGNOSIS — E876 Hypokalemia: Secondary | ICD-10-CM

## 2022-12-12 DIAGNOSIS — C61 Malignant neoplasm of prostate: Secondary | ICD-10-CM | POA: Diagnosis not present

## 2022-12-12 DIAGNOSIS — T502X5A Adverse effect of carbonic-anhydrase inhibitors, benzothiadiazides and other diuretics, initial encounter: Secondary | ICD-10-CM

## 2022-12-12 DIAGNOSIS — Z01812 Encounter for preprocedural laboratory examination: Secondary | ICD-10-CM

## 2022-12-12 HISTORY — PX: ROBOT ASSISTED LAPAROSCOPIC RADICAL PROSTATECTOMY: SHX5141

## 2022-12-12 HISTORY — PX: PELVIC LYMPH NODE DISSECTION: SHX6543

## 2022-12-12 LAB — POCT I-STAT, CHEM 8
BUN: 11 mg/dL (ref 6–20)
Calcium, Ion: 1.01 mmol/L — ABNORMAL LOW (ref 1.15–1.40)
Chloride: 105 mmol/L (ref 98–111)
Creatinine, Ser: 1 mg/dL (ref 0.61–1.24)
Glucose, Bld: 115 mg/dL — ABNORMAL HIGH (ref 70–99)
HCT: 51 % (ref 39.0–52.0)
Hemoglobin: 17.3 g/dL — ABNORMAL HIGH (ref 13.0–17.0)
Potassium: 4.3 mmol/L (ref 3.5–5.1)
Sodium: 137 mmol/L (ref 135–145)
TCO2: 25 mmol/L (ref 22–32)

## 2022-12-12 SURGERY — PROSTATECTOMY, RADICAL, ROBOT-ASSISTED, LAPAROSCOPIC
Anesthesia: General

## 2022-12-12 MED ORDER — PROPOFOL 10 MG/ML IV BOLUS
INTRAVENOUS | Status: AC
  Filled 2022-12-12: qty 20

## 2022-12-12 MED ORDER — NEOSTIGMINE METHYLSULFATE 10 MG/10ML IV SOLN
INTRAVENOUS | Status: DC | PRN
  Administered 2022-12-12: 3 mg via INTRAVENOUS

## 2022-12-12 MED ORDER — CHLORHEXIDINE GLUCONATE CLOTH 2 % EX PADS
6.0000 | MEDICATED_PAD | Freq: Every day | CUTANEOUS | Status: DC
  Administered 2022-12-13: 6 via TOPICAL

## 2022-12-12 MED ORDER — MIDAZOLAM HCL 2 MG/2ML IJ SOLN
INTRAMUSCULAR | Status: AC
  Filled 2022-12-12: qty 2

## 2022-12-12 MED ORDER — ROCURONIUM BROMIDE 100 MG/10ML IV SOLN
INTRAVENOUS | Status: DC | PRN
  Administered 2022-12-12: 60 mg via INTRAVENOUS
  Administered 2022-12-12: 10 mg via INTRAVENOUS
  Administered 2022-12-12: 20 mg via INTRAVENOUS

## 2022-12-12 MED ORDER — LIDOCAINE HCL (CARDIAC) PF 100 MG/5ML IV SOSY
PREFILLED_SYRINGE | INTRAVENOUS | Status: DC | PRN
  Administered 2022-12-12: 100 mg via INTRAVENOUS

## 2022-12-12 MED ORDER — ACETAMINOPHEN 10 MG/ML IV SOLN
INTRAVENOUS | Status: AC
  Filled 2022-12-12: qty 100

## 2022-12-12 MED ORDER — ONDANSETRON HCL 4 MG/2ML IJ SOLN
INTRAMUSCULAR | Status: AC
  Filled 2022-12-12: qty 2

## 2022-12-12 MED ORDER — HEPARIN SODIUM (PORCINE) 5000 UNIT/ML IJ SOLN
INTRAMUSCULAR | Status: AC
  Filled 2022-12-12: qty 1

## 2022-12-12 MED ORDER — FENTANYL CITRATE (PF) 100 MCG/2ML IJ SOLN: 25.0000 ug | INTRAMUSCULAR | Status: DC | PRN

## 2022-12-12 MED ORDER — ONDANSETRON HCL 4 MG/2ML IJ SOLN: 4.0000 mg | INTRAMUSCULAR | Status: DC | PRN

## 2022-12-12 MED ORDER — ACETAMINOPHEN 500 MG PO TABS
1000.0000 mg | ORAL_TABLET | Freq: Four times a day (QID) | ORAL | Status: DC
  Administered 2022-12-13 (×2): 1000 mg via ORAL
  Filled 2022-12-12 (×3): qty 2

## 2022-12-12 MED ORDER — OXYCODONE HCL 5 MG/5ML PO SOLN: 5.0000 mg | Freq: Once | ORAL | Status: DC | PRN

## 2022-12-12 MED ORDER — DEXAMETHASONE SODIUM PHOSPHATE 10 MG/ML IJ SOLN
INTRAMUSCULAR | Status: DC | PRN
  Administered 2022-12-12: 10 mg via INTRAVENOUS

## 2022-12-12 MED ORDER — FAMOTIDINE 20 MG PO TABS
20.0000 mg | ORAL_TABLET | Freq: Every day | ORAL | Status: DC
  Administered 2022-12-13: 20 mg via ORAL
  Filled 2022-12-12: qty 1

## 2022-12-12 MED ORDER — BUPIVACAINE HCL (PF) 0.5 % IJ SOLN
INTRAMUSCULAR | Status: AC
  Filled 2022-12-12: qty 30

## 2022-12-12 MED ORDER — ACETAMINOPHEN 10 MG/ML IV SOLN
INTRAVENOUS | Status: DC | PRN
  Administered 2022-12-12: 1000 mg via INTRAVENOUS

## 2022-12-12 MED ORDER — MIDAZOLAM HCL 2 MG/2ML IJ SOLN
INTRAMUSCULAR | Status: DC | PRN
  Administered 2022-12-12: 2 mg via INTRAVENOUS

## 2022-12-12 MED ORDER — ONDANSETRON HCL 4 MG/2ML IJ SOLN
INTRAMUSCULAR | Status: DC | PRN
  Administered 2022-12-12: 4 mg via INTRAVENOUS

## 2022-12-12 MED ORDER — OXYCODONE HCL 5 MG PO TABS: 5.0000 mg | ORAL_TABLET | Freq: Once | ORAL | Status: DC | PRN

## 2022-12-12 MED ORDER — GLYCOPYRROLATE 0.2 MG/ML IJ SOLN
INTRAMUSCULAR | Status: DC | PRN
  Administered 2022-12-12: .4 mg via INTRAVENOUS

## 2022-12-12 MED ORDER — ROCURONIUM BROMIDE 10 MG/ML (PF) SYRINGE
PREFILLED_SYRINGE | INTRAVENOUS | Status: AC
  Filled 2022-12-12: qty 10

## 2022-12-12 MED ORDER — PHENYLEPHRINE HCL-NACL 20-0.9 MG/250ML-% IV SOLN
INTRAVENOUS | Status: DC | PRN
  Administered 2022-12-12: 40 ug/min via INTRAVENOUS

## 2022-12-12 MED ORDER — PHENYLEPHRINE HCL (PRESSORS) 10 MG/ML IV SOLN
INTRAVENOUS | Status: DC | PRN
  Administered 2022-12-12: 160 ug via INTRAVENOUS
  Administered 2022-12-12 (×2): 80 ug via INTRAVENOUS
  Administered 2022-12-12: 160 ug via INTRAVENOUS
  Administered 2022-12-12: 80 ug via INTRAVENOUS
  Administered 2022-12-12: 160 ug via INTRAVENOUS

## 2022-12-12 MED ORDER — SURGIFLO WITH THROMBIN (HEMOSTATIC MATRIX KIT) OPTIME
TOPICAL | Status: DC | PRN
  Administered 2022-12-12: 1 via TOPICAL

## 2022-12-12 MED ORDER — LIDOCAINE HCL (PF) 2 % IJ SOLN
INTRAMUSCULAR | Status: AC
  Filled 2022-12-12: qty 5

## 2022-12-12 MED ORDER — CEFAZOLIN SODIUM-DEXTROSE 2-4 GM/100ML-% IV SOLN
INTRAVENOUS | Status: AC
  Filled 2022-12-12: qty 100

## 2022-12-12 MED ORDER — HYDROMORPHONE HCL 1 MG/ML IJ SOLN: 0.5000 mg | INTRAMUSCULAR | Status: DC | PRN

## 2022-12-12 MED ORDER — HYDROCODONE-ACETAMINOPHEN 5-325 MG PO TABS: 1.0000 | ORAL_TABLET | ORAL | Status: DC | PRN

## 2022-12-12 MED ORDER — ORAL CARE MOUTH RINSE: 15.0000 mL | OROMUCOSAL | Status: DC | PRN

## 2022-12-12 MED ORDER — FENTANYL CITRATE (PF) 100 MCG/2ML IJ SOLN
INTRAMUSCULAR | Status: DC | PRN
  Administered 2022-12-12 (×2): 50 ug via INTRAVENOUS

## 2022-12-12 MED ORDER — BUPIVACAINE HCL 0.5 % IJ SOLN
INTRAMUSCULAR | Status: DC | PRN
  Administered 2022-12-12: 10 mL
  Administered 2022-12-12: 20 mL

## 2022-12-12 MED ORDER — PROPOFOL 10 MG/ML IV BOLUS
INTRAVENOUS | Status: DC | PRN
  Administered 2022-12-12: 200 mg via INTRAVENOUS

## 2022-12-12 MED ORDER — HEPARIN SODIUM (PORCINE) 5000 UNIT/ML IJ SOLN
5000.0000 [IU] | Freq: Three times a day (TID) | INTRAMUSCULAR | Status: DC
  Administered 2022-12-13: 5000 [IU] via SUBCUTANEOUS
  Filled 2022-12-12: qty 1

## 2022-12-12 MED ORDER — DEXMEDETOMIDINE HCL IN NACL 200 MCG/50ML IV SOLN
INTRAVENOUS | Status: DC | PRN
  Administered 2022-12-12 (×3): 4 ug via INTRAVENOUS

## 2022-12-12 MED ORDER — STERILE WATER FOR IRRIGATION IR SOLN
Status: DC | PRN
  Administered 2022-12-12: 50 mL

## 2022-12-12 MED ORDER — DEXAMETHASONE SODIUM PHOSPHATE 10 MG/ML IJ SOLN
INTRAMUSCULAR | Status: AC
  Filled 2022-12-12: qty 1

## 2022-12-12 MED ORDER — OXYBUTYNIN CHLORIDE 5 MG PO TABS
5.0000 mg | ORAL_TABLET | Freq: Three times a day (TID) | ORAL | Status: DC | PRN
  Administered 2022-12-12: 5 mg via ORAL
  Filled 2022-12-12: qty 1

## 2022-12-12 MED ORDER — CHLORHEXIDINE GLUCONATE 0.12 % MT SOLN
OROMUCOSAL | Status: AC
  Filled 2022-12-12: qty 15

## 2022-12-12 MED ORDER — FENTANYL CITRATE (PF) 100 MCG/2ML IJ SOLN
INTRAMUSCULAR | Status: AC
  Filled 2022-12-12: qty 2

## 2022-12-12 SURGICAL SUPPLY — 99 items
ADH SKN CLS APL DERMABOND .7 (GAUZE/BANDAGES/DRESSINGS) ×4
AGENT HMST KT MTR STRL THRMB (HEMOSTASIS) ×2
ANCHOR TIS RET SYS 235ML (MISCELLANEOUS) ×2 IMPLANT
APL ESCP 34 STRL LF DISP (HEMOSTASIS) ×2
APL PRP STRL LF DISP 70% ISPRP (MISCELLANEOUS) ×6
APPLICATOR SURGIFLO ENDO (HEMOSTASIS) ×2 IMPLANT
APPLIER CLIP ROT 10 11.4 M/L (STAPLE) ×2
APR CLP MED LRG 11.4X10 (STAPLE) ×2
BAG DRN RND TRDRP ANRFLXCHMBR (UROLOGICAL SUPPLIES) ×2
BAG PRESSURE INF REUSE 1000 (BAG) IMPLANT
BAG TISS RTRVL C235 10X14 (MISCELLANEOUS) ×2
BAG URINE DRAIN 2000ML AR STRL (UROLOGICAL SUPPLIES) ×2 IMPLANT
BLADE CLIPPER SURG (BLADE) ×2 IMPLANT
BULB RESERV EVAC DRAIN JP 100C (MISCELLANEOUS) IMPLANT
CATH FOL 2WAY LX 18X5 (CATHETERS) ×2 IMPLANT
CATH FOL 2WAY LX 20X5 (CATHETERS) IMPLANT
CHLORAPREP W/TINT 26 (MISCELLANEOUS) ×4 IMPLANT
CLIP APPLIE ROT 10 11.4 M/L (STAPLE) IMPLANT
CLIP LIGATING HEM O LOK PURPLE (MISCELLANEOUS) ×6 IMPLANT
COVER TIP SHEARS 8 DVNC (MISCELLANEOUS) ×2 IMPLANT
DERMABOND ADVANCED .7 DNX12 (GAUZE/BANDAGES/DRESSINGS) ×2 IMPLANT
DRAIN CHANNEL JP 15F RND 16 (MISCELLANEOUS) IMPLANT
DRAIN CHANNEL JP 19F (MISCELLANEOUS) IMPLANT
DRAPE 3/4 80X56 (DRAPES) ×2 IMPLANT
DRAPE ARM DVNC X/XI (DISPOSABLE) ×8 IMPLANT
DRAPE COLUMN DVNC XI (DISPOSABLE) ×2 IMPLANT
DRAPE LEGGINS SURG 28X43 STRL (DRAPES) ×2 IMPLANT
DRAPE SURG 17X11 SM STRL (DRAPES) ×8 IMPLANT
DRAPE UNDER BUTTOCK W/FLU (DRAPES) ×2 IMPLANT
DRIVER NDL LRG 8 DVNC XI (INSTRUMENTS) ×2 IMPLANT
DRIVER NDLE LRG 8 DVNC XI (INSTRUMENTS) ×2 IMPLANT
DRSG TEGADERM 6X8 (GAUZE/BANDAGES/DRESSINGS) IMPLANT
DRSG TELFA 3X8 NADH STRL (GAUZE/BANDAGES/DRESSINGS) ×2 IMPLANT
ELECT REM PT RETURN 9FT ADLT (ELECTROSURGICAL) ×2
ELECTRODE REM PT RTRN 9FT ADLT (ELECTROSURGICAL) ×2 IMPLANT
FORCEPS BPLR 8 MD DVNC XI (FORCEP) ×2 IMPLANT
FORCEPS BPLR FENES DVNC XI (FORCEP) ×2 IMPLANT
FORCEPS PROGRASP DVNC XI (FORCEP) ×2 IMPLANT
GLOVE BIOGEL PI IND STRL 7.5 (GLOVE) ×6 IMPLANT
GOWN SRG XL LVL 3 NONREINFORCE (GOWNS) IMPLANT
GOWN STRL NON-REIN TWL XL LVL3 (GOWNS) ×2
GOWN STRL REUS W/ TWL LRG LVL3 (GOWN DISPOSABLE) ×4 IMPLANT
GOWN STRL REUS W/ TWL XL LVL3 (GOWN DISPOSABLE) ×4 IMPLANT
GOWN STRL REUS W/TWL LRG LVL3 (GOWN DISPOSABLE) ×4
GOWN STRL REUS W/TWL XL LVL3 (GOWN DISPOSABLE) ×4
GRASPER SUT TROCAR 14GX15 (MISCELLANEOUS) ×2 IMPLANT
HEMOSTAT SURGICEL 2X14 (HEMOSTASIS) IMPLANT
HOLDER FOLEY CATH W/STRAP (MISCELLANEOUS) ×2 IMPLANT
IRRIGATION STRYKERFLOW (MISCELLANEOUS) ×2 IMPLANT
IRRIGATOR STRYKERFLOW (MISCELLANEOUS) ×2
KIT PINK PAD W/HEAD ARE REST (MISCELLANEOUS) ×2
KIT PINK PAD W/HEAD ARM REST (MISCELLANEOUS) ×2 IMPLANT
LABEL OR SOLS (LABEL) ×2 IMPLANT
MANIFOLD NEPTUNE II (INSTRUMENTS) ×2 IMPLANT
NDL HYPO 22X1.5 SAFETY MO (MISCELLANEOUS) ×2 IMPLANT
NDL INSUFFLATION 14GA 120MM (NEEDLE) ×2 IMPLANT
NEEDLE HYPO 22X1.5 SAFETY MO (MISCELLANEOUS) ×2 IMPLANT
NEEDLE INSUFFLATION 14GA 120MM (NEEDLE) ×2 IMPLANT
NS IRRIG 500ML POUR BTL (IV SOLUTION) ×2 IMPLANT
OBTURATOR OPTICAL STND 8 DVNC (TROCAR) ×2
OBTURATOR OPTICALSTD 8 DVNC (TROCAR) ×2 IMPLANT
PACK LAP CHOLECYSTECTOMY (MISCELLANEOUS) ×2 IMPLANT
PENCIL SMOKE EVACUATOR (MISCELLANEOUS) ×2 IMPLANT
RELOAD STAPLE 60 2.6 WHT THN (STAPLE) IMPLANT
RELOAD STAPLER WHITE 60MM (STAPLE) ×2 IMPLANT
SCISSORS MNPLR CVD DVNC XI (INSTRUMENTS) ×2 IMPLANT
SEAL UNIV 5-12 XI (MISCELLANEOUS) ×8 IMPLANT
SET TUBE SMOKE EVAC HIGH FLOW (TUBING) ×2 IMPLANT
SOL ELECTROSURG ANTI STICK (MISCELLANEOUS) ×2
SOLUTION ELECTROSURG ANTI STCK (MISCELLANEOUS) ×2 IMPLANT
SPONGE T-LAP 4X18 ~~LOC~~+RFID (SPONGE) ×2 IMPLANT
SPONGE VERSALON 4X4 4PLY (MISCELLANEOUS) IMPLANT
STAPLE ECHEON FLEX 60 POW ENDO (STAPLE) IMPLANT
STAPLER RELOAD WHITE 60MM (STAPLE) ×2
STAPLER SKIN PROX 35W (STAPLE) ×2 IMPLANT
STRAP SAFETY 5IN WIDE (MISCELLANEOUS) ×2 IMPLANT
SURGIFLO W/THROMBIN 8M KIT (HEMOSTASIS) ×2 IMPLANT
SURGILUBE 2OZ TUBE FLIPTOP (MISCELLANEOUS) ×2 IMPLANT
SUT DVC VLOC 90 3-0 CV23 UNDY (SUTURE) ×4 IMPLANT
SUT DVC VLOC 90 3-0 CV23 VLT (SUTURE) ×2
SUT ETHILON 3-0 FS-10 30 BLK (SUTURE)
SUT MNCRL 4-0 (SUTURE) ×4
SUT MNCRL 4-0 27XMFL (SUTURE) ×4
SUT PROLENE 5 0 RB 1 DA (SUTURE) IMPLANT
SUT SILK 2 0 SH (SUTURE) IMPLANT
SUT VIC AB 0 CT1 36 (SUTURE) ×4 IMPLANT
SUT VIC AB 2-0 CT1 (SUTURE) IMPLANT
SUT VICRYL 0 UR6 27IN ABS (SUTURE) ×2 IMPLANT
SUTURE DVC VLC 90 3-0 CV23 VLT (SUTURE) ×2 IMPLANT
SUTURE EHLN 3-0 FS-10 30 BLK (SUTURE) IMPLANT
SUTURE MNCRL 4-0 27XMF (SUTURE) ×4 IMPLANT
SYR TOOMEY 50ML (SYRINGE) ×2 IMPLANT
TAPE CLOTH 3X10 WHT NS LF (GAUZE/BANDAGES/DRESSINGS) ×4 IMPLANT
TRAP FLUID SMOKE EVACUATOR (MISCELLANEOUS) ×2 IMPLANT
TROCAR XCEL NON-BLD 11X100MML (ENDOMECHANICALS) IMPLANT
TROCAR Z-THREAD FIOS 12X100MM (TROCAR) ×2 IMPLANT
TROCAR Z-THREAD FIOS 5X100MM (TROCAR) ×2 IMPLANT
WATER STERILE IRR 3000ML UROMA (IV SOLUTION) ×2 IMPLANT
WATER STERILE IRR 500ML POUR (IV SOLUTION) ×2 IMPLANT

## 2022-12-12 NOTE — Anesthesia Postprocedure Evaluation (Signed)
Anesthesia Post Note  Patient: Tyler Peters  Procedure(s) Performed: XI ROBOTIC ASSISTED LAPAROSCOPIC RADICAL PROSTATECTOMY PELVIC LYMPH NODE DISSECTION (Bilateral)  Patient location during evaluation: PACU Anesthesia Type: General Level of consciousness: awake and alert Pain management: pain level controlled Vital Signs Assessment: post-procedure vital signs reviewed and stable Respiratory status: spontaneous breathing, nonlabored ventilation, respiratory function stable and patient connected to nasal cannula oxygen Cardiovascular status: blood pressure returned to baseline and stable Postop Assessment: no apparent nausea or vomiting Anesthetic complications: no  No notable events documented.   Last Vitals:  Vitals:   12/12/22 1413 12/12/22 1415  BP:  108/72  Pulse: 68 68  Resp: 16 17  Temp:  (!) 36.1 C  SpO2: 100% 99%    Last Pain:  Vitals:   12/12/22 1415  TempSrc:   PainSc: Asleep                 Stephanie Coup

## 2022-12-12 NOTE — Anesthesia Preprocedure Evaluation (Signed)
Anesthesia Evaluation  Patient identified by MRN, date of birth, ID band Patient awake    Reviewed: Allergy & Precautions, H&P , NPO status , Patient's Chart, lab work & pertinent test results, reviewed documented beta blocker date and time   History of Anesthesia Complications Negative for: history of anesthetic complications  Airway Mallampati: II  TM Distance: >3 FB Neck ROM: full    Dental  (+) Dental Advidsory Given, Teeth Intact, Caps   Pulmonary sleep apnea           Cardiovascular Exercise Tolerance: Good hypertension, (-) angina (-) CAD, (-) Past MI, (-) Cardiac Stents and (-) CABG (-) dysrhythmias (-) Valvular Problems/Murmurs     Neuro/Psych negative neurological ROS  negative psych ROS   GI/Hepatic Neg liver ROS,GERD  Medicated,,  Endo/Other  negative endocrine ROS    Renal/GU negative Renal ROS  negative genitourinary   Musculoskeletal   Abdominal   Peds  Hematology negative hematology ROS (+)   Anesthesia Other Findings Past Medical History: No date: Episodic recurrent vertigo     Comment:  evaluated by Dr. Jenne Campus with normal MRI No date: Episodic recurrent vertigo     Comment:  normal MRI  Fall 2008: History of acute prostatitis No date: Hyperlipidemia, mixed 2008: Testicular pain     Comment:  prostatis, treated with Flomax by Dr. Achilles Dunk   Reproductive/Obstetrics negative OB ROS                             Anesthesia Physical Anesthesia Plan  ASA: 2  Anesthesia Plan: General ETT   Post-op Pain Management:    Induction: Intravenous  PONV Risk Score and Plan: 3 and Ondansetron, Dexamethasone and Midazolam  Airway Management Planned: Oral ETT  Additional Equipment:   Intra-op Plan:   Post-operative Plan: Extubation in OR  Informed Consent: I have reviewed the patients History and Physical, chart, labs and discussed the procedure including the risks,  benefits and alternatives for the proposed anesthesia with the patient or authorized representative who has indicated his/her understanding and acceptance.     Dental Advisory Given  Plan Discussed with: Anesthesiologist, CRNA and Surgeon  Anesthesia Plan Comments: (Patient consented for risks of anesthesia including but not limited to:  - adverse reactions to medications - damage to eyes, teeth, lips or other oral mucosa - nerve damage due to positioning  - sore throat or hoarseness - Damage to heart, brain, nerves, lungs, other parts of body or loss of life  Patient voiced understanding.)        Anesthesia Quick Evaluation

## 2022-12-12 NOTE — Op Note (Signed)
Date of procedure: 12/12/2022   Preoperative diagnosis:  High risk prostate cancer   Postoperative diagnosis:  Same   Procedure: Da Vinci robotic radical prostatectomy Bilateral pelvic lymph node dissection   Surgeon: Legrand Rams, MD   Assistant: Vanna Scotland, MD  (An assistant was required for this surgical procedure.  The duties of the assistant included but were not limited to suctioning, passing suture, camera manipulation, retraction. This procedure would not be able to be performed without an assistant)   Anesthesia: General   Complications: None   Intraoperative findings:  1.  Uncomplicated robotic prostatectomy and pelvic LN dissection 2.  Watertight anastomosis   EBL: 50 cc   Specimens:  1.  Prostate and seminal vesicles 2. Right pelvic lymph nodes 3. Left pelvic lymph nodes   Drains: 20 French 2-way Foley   Indication: Tyler Peters is a 62 y.o. male with high risk prostate cancer   We discussed treatment options including surgery or radiation, and he elected for robotic prostatectomy.  After reviewing the management options for treatment, they elected to proceed with the above surgical procedure(s). We have discussed the potential benefits and risks of the procedure, side effects of the proposed treatment, the likelihood of the patient achieving the goals of the procedure, and any potential problems that might occur during the procedure or recuperation. Informed consent has been obtained. We specifically discussed the risks of bleeding, infection, bowel injury, injury to surrounding structures, incontinence, erectile dysfunction, urine leak, possible need for prolonged foley or drain placement, need for long term PSA monitoring, risk of recurrence of disease, and possible need for salvage radiation in the future.   Description of procedure:   The patient was taken to the operating room and general anesthesia was induced. SCDs were placed for DVT prophylaxis, and  5000 units subcutaneous heparin were given.  The patient was placed in the dorsal lithotomy position, carefully padded with the arms against the sides, prepped and draped in the usual sterile fashion, and preoperative antibiotics(Ancef) were administered. A preoperative time-out was performed.    On the field, an 58 [place] Foley catheter passed easily into the bladder with return of clear yellow urine.  An 8 mm incision was made above the umbilicus, and a Veress needle was used to obtain access to the peritoneum.  The abdomen was insufflated to 15 mmHg, and the robotic ports were placed in standard fashion under direct vision. A Carter-Thomason device was used to pre-place an 0-vicryl suture in the right lateral 11mm assistant port under direct vision. Thorough inspection of the abdomen showed no bowel injury, there were some adhesions to the colon in the left lower quadrant.  Lidocaine was injected into all port sites prior to placement.  The patient was then placed in steep Trendelenburg position and the robot was docked.  The adhesions were sharply taken down with scissors.   I started by entering the space of Retzius and taking down the bladder.  Once this was free to the vas deferens bilaterally, the periprostatic fat was cleaned off the prostate and sent for permanent.  The endopelvic fascia was opened bilaterally, and muscle fibers spared laterally.  The periprostatic ligaments were carefully taken down.  The DVC was isolated, and ligated using a 60 mm load vascular stapler.    I then turned my attention to the anterior bladder neck, and this was incised down to the level of the Foley catheter.  The Foley catheter was removed from the bladder and gently held upward  for traction.  The posterior bladder neck was carefully divided, and we identified the plane between the prostate and the seminal vesicles.  The ampulla of the vas was divided bilaterally, and held up for retraction.  The seminal vesicles  were then dissected out bilaterally intact.  Denonvielliers fascia was opened posteriorly to the prostate and freed laterally. The pedicles were taken down using metal and weck clips, and cautery was minimized. A nerve spare was performed on the left side, but not on the right secondary to his extensive high risk disease. The prostate was then gently retracted superiorly, and the apex was dissected free.  Once the urethra was identified, the use of electrocautery was minimized.  The urethra was sharply entered and the Foley catheter removed.  The posterior urethral plate was divided. The specimen was placed in an Endo Catch bag and moved out of the field.  There was good hemostasis in the pelvis.   A lymph node dissection was then performed. I started on the right side and removed the lymphatic tissue in the obturator fossa with the borders of the external iliac vein laterally, node of [place]et distally, the bladder medially, and the obturator nerve. An identical procedure was performed on the left side. Weck clips were used to clip visualized lymphatics.   A 3-0 V lock stitch was used to reapproximate the tissue behind the bladder and posteriorly to the urethra as described by Rocco in order to remove tension from the anastomosis.  Two 3-0 V lock stitches were connected and used for the urethrovesical anastomosis.  A running anastomosis was performed circumferentially and there appeared to be good approximation of the mucosa on both sides.  A new 20 French Foley passed easily into the bladder. With the sutures tied anteriorly and 12cc in the balloon, 120 cc normal saline were instilled with no leakage noted in the operative field.  A anterior urethral suspension was performed by passing the sutures through the posterior aspect of the pubic bone with gentle tension to aid in return to continence.  There was good hemostasis.  A small piece of Surgicel was placed on either side of the anastomosis along the  endopelvic fascia in addition to Surgi-Flo, as well as in the obturator fossa bilaterally. All instruments were removed and the robot was undocked.   The supraumbilical incision was extended superiorly, and the specimen was removed.  The pre-placed fascial suture in the 11mm right lateral assistant port was tied down. The fascia of the small midline incision was closed using a running 0 Vicryl.  All port sites were copiously irrigated.  There was good hemostasis.  A running 4-0 Monocryl was used to close all incisions, and Dermabond was applied.   All counts were correct, and the patient was awakened and taken to PACU in stable condition.   Disposition: Stable to PACU   Plan: Anticipate discharge home tomorrow with catheter in place for 1 week  Legrand Rams, MD 12/12/2022

## 2022-12-12 NOTE — Anesthesia Procedure Notes (Signed)
Procedure Name: Intubation Date/Time: 12/12/2022 11:00 AM  Performed by: Reece Agar, CRNAPre-anesthesia Checklist: Patient identified, Emergency Drugs available, Suction available and Patient being monitored Patient Re-evaluated:Patient Re-evaluated prior to induction Oxygen Delivery Method: Circle system utilized Preoxygenation: Pre-oxygenation with 100% oxygen Induction Type: IV induction Ventilation: Mask ventilation without difficulty Laryngoscope Size: McGraph and 3 Grade View: Grade I Tube type: Oral Tube size: 7.0 mm Number of attempts: 1 Airway Equipment and Method: Stylet and Oral airway Placement Confirmation: ETT inserted through vocal cords under direct vision, positive ETCO2 and breath sounds checked- equal and bilateral Secured at: 22 cm Tube secured with: Tape Dental Injury: Teeth and Oropharynx as per pre-operative assessment

## 2022-12-12 NOTE — Transfer of Care (Signed)
Immediate Anesthesia Transfer of Care Note  Patient: Tyler Peters  Procedure(s) Performed: XI ROBOTIC ASSISTED LAPAROSCOPIC RADICAL PROSTATECTOMY PELVIC LYMPH NODE DISSECTION (Bilateral)  Patient Location: PACU  Anesthesia Type:General  Level of Consciousness: drowsy  Airway & Oxygen Therapy: Patient Spontanous Breathing and Patient connected to face mask oxygen  Post-op Assessment: Report given to RN and Post -op Vital signs reviewed and stable  Post vital signs: Reviewed and stable  Last Vitals:  Vitals Value Taken Time  BP 108/72 12/12/22 1408  Temp    Pulse 68 12/12/22 1413  Resp 16 12/12/22 1413  SpO2 100 % 12/12/22 1413  Vitals shown include unvalidated device data.  Last Pain:  Vitals:   12/12/22 1018  TempSrc: Temporal  PainSc: 0-No pain         Complications: No notable events documented.

## 2022-12-12 NOTE — H&P (Signed)
12/12/22 10:44 AM   Tyler Peters [birth year] [id number]  CC: Prostate cancer  HPI: 62 year old male with PSA of 4.7(8.7% free) found to have high risk prostate cancer on biopsy, primarily right-sided disease.  He opted for robotic radical prostatectomy.  Denies any problems with erections or urinary symptoms at baseline.  No prior abdominal surgeries.   PMH: Past Medical History:  Diagnosis Date   Cancer [facility])    prostate   Complication of anesthesia    slow awake   Episodic recurrent vertigo    evaluated by Dr. Jenne Campus with normal MRI   Episodic recurrent vertigo    normal MRI    GERD (gastroesophageal reflux disease)    History of acute prostatitis Fall 2008   History of kidney stones    Hyperlipidemia, mixed    Hypertension    Sleep apnea    Testicular pain 2008   prostatis, treated with Flomax by Dr. Achilles Dunk    Surgical History: Past Surgical History:  Procedure Laterality Date   COLONOSCOPY WITH PROPOFOL N/A 01/17/2018   Procedure: COLONOSCOPY WITH PROPOFOL;  Surgeon: Earline Mayotte, MD;  Location: [facility] ENDOSCOPY;  Service: Endoscopy;  Laterality: N/A;   LITHOTRIPSY     NASAL SEPTUM SURGERY  2004     Family History: Family History  Problem Relation Age of Onset   Heart disease Father    Heart disease Maternal Grandmother    Diabetes Maternal Grandmother     Social History:  reports that he has never smoked. He has never been exposed to tobacco smoke. He has never used smokeless tobacco. He reports that he does not drink alcohol and does not use drugs.  Physical Exam: BP (!) 139/106   Pulse (!) 109   Temp 98.1 F (36.7 C) (Temporal)   Resp 17   SpO2 98%    Constitutional:  Alert and oriented, No acute distress. Cardiovascular: Regular rate and rhythm Respiratory: Clear to auscultation bilaterally GI: Abdomen is soft, nontender, nondistended, no abdominal masses   Assessment & Plan:   Healthy 62 year old male with high risk prostate cancer,  no evidence of metastatic disease who opted for robotic radical prostatectomy.  In regards to surgery, we discussed robotic prostatectomy +/- lymphadenectomy at length.  The procedure takes 3 to 4 hours, and patient's typically discharge home on post-op day #1.  A Foley catheter is left in place for 7 to 10 days to allow for healing of the vesicourethral anastomosis.  There is a small risk of bleeding, infection, damage to surrounding structures or bowel, hernia, DVT/PE, or serious cardiac or pulmonary complications.  We discussed at length post-op side effects including erectile dysfunction, and the importance of pre-operative erectile function on long-term outcomes.  Even with a nerve sparing approach, there is an approximately 25% rate of permanent erectile dysfunction.  We also discussed postop urinary incontinence at length.  We expect patients to have stress incontinence post-operatively that will improve over period of weeks to months.  Less than 10% of men will require a pad at 1 year after surgery.  Patients will need to avoid heavy lifting and strenuous activity for 3 to 4 weeks, but most men return to their baseline activity status by 6 weeks.  We discussed the risk of positive margins as well as biochemical recurrence potentially requiring additional treatments including radiation or hormone treatment in the future.  Robotic radical prostatectomy with bilateral pelvic lymph node dissection   Legrand Rams, MD 12/12/2022  [facility] [address]  [address] [place], [place] [address] [phone]

## 2022-12-13 ENCOUNTER — Encounter: Payer: Self-pay | Admitting: Urology

## 2022-12-13 DIAGNOSIS — D219 Benign neoplasm of connective and other soft tissue, unspecified: Secondary | ICD-10-CM | POA: Diagnosis not present

## 2022-12-13 DIAGNOSIS — Z79899 Other long term (current) drug therapy: Secondary | ICD-10-CM | POA: Diagnosis not present

## 2022-12-13 DIAGNOSIS — C61 Malignant neoplasm of prostate: Secondary | ICD-10-CM | POA: Diagnosis not present

## 2022-12-13 DIAGNOSIS — I10 Essential (primary) hypertension: Secondary | ICD-10-CM | POA: Diagnosis not present

## 2022-12-13 LAB — BASIC METABOLIC PANEL
Anion gap: 10 (ref 5–15)
BUN: 13 mg/dL (ref 6–20)
CO2: 25 mmol/L (ref 22–32)
Calcium: 8.9 mg/dL (ref 8.9–10.3)
Chloride: 101 mmol/L (ref 98–111)
Creatinine, Ser: 0.96 mg/dL (ref 0.61–1.24)
GFR, Estimated: >60 mL/min (ref >60)
Glucose, Bld: 130 mg/dL — ABNORMAL HIGH (ref 70–99)
Potassium: 3.3 mmol/L — ABNORMAL LOW (ref 3.5–5.1)
Sodium: 136 mmol/L (ref 135–145)

## 2022-12-13 LAB — CBC
HCT: 43.8 % (ref 39.0–52.0)
Hemoglobin: 14.7 g/dL (ref 13.0–17.0)
MCH: 28.7 pg (ref 26.0–34.0)
MCHC: 33.6 g/dL (ref 30.0–36.0)
MCV: 85.5 fL (ref 80.0–100.0)
Platelets: 282 10*3/uL (ref 150–400)
RBC: 5.12 MIL/uL (ref 4.22–5.81)
RDW: 13 % (ref 11.5–15.5)
WBC: 13.1 10*3/uL — ABNORMAL HIGH (ref 4.0–10.5)
nRBC: 0 % (ref 0.0–0.2)

## 2022-12-13 MED ORDER — HYDROCODONE-ACETAMINOPHEN 5-325 MG PO TABS: 1.0000 | ORAL_TABLET | ORAL | 0 refills | Status: DC | PRN

## 2022-12-13 MED ORDER — DOCUSATE SODIUM 100 MG PO CAPS: 100.0000 mg | ORAL_CAPSULE | Freq: Two times a day (BID) | ORAL | 0 refills | Status: DC

## 2022-12-13 MED ORDER — OXYBUTYNIN CHLORIDE 5 MG PO TABS: 5.0000 mg | ORAL_TABLET | Freq: Three times a day (TID) | ORAL | 0 refills | Status: DC | PRN

## 2022-12-13 NOTE — Discharge Summary (Signed)
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 Briefly, Tyler Peters is a 62 y.o. year old patient admitted on 12/12/2022 for scheduled robotic assisted radical prostatectomy with bilateral pelvic lymph node dissection with Dr. Richardo Hanks for management of high risk prostate cancer.   This morning he reports feeling well with minimal pain, not requiring opioid analgesia.  He has passed flatus and ambulated last night without difficulty.  He denies nausea this morning.  Foley catheter in place draining clear, pink-tinged urine.  Diet was advanced in the morning and he was subsequently able to tolerate p.o.  A.m. labs with anticipated postoperative changes.  Physical Exam: Constitutional:  Alert and oriented, no acute distress, nontoxic appearing HEENT: [place], AT Cardiovascular: No clubbing, cyanosis, or edema Respiratory: Normal respiratory effort, no increased work of breathing GI: Abdomen is soft with appropriate postoperative tenderness, no rigidity or rebound.  Surgical incisions noted,  all clean, dry, and intact with overlying surgical adhesive. Skin: No rashes, bruises or suspicious lesions Neurologic: Grossly intact, no focal deficits, moving all 4 extremities Psychiatric: Normal mood and affect   Hospital Course: Patient tolerated the procedure well.  He was then transferred to the floor after an uneventful PACU stay.  His hospital course was uncomplicated.  On POD#1 he had met discharge criteria: was eating a regular diet, was up and ambulating independently,  pain was well controlled, catheter was draining well, and was ready for discharge.  Laboratory values:  Recent Labs    12/12/22 1045 12/13/22 0500  WBC  --  13.1*  HGB 17.3* 14.7  HCT 51.0 43.8   Recent Labs    12/12/22 1045 12/13/22 0500  NA 137 136  K 4.3 3.3*  CL 105 101  CO2  --  25  GLUCOSE 115* 130*  BUN 11 13  CREATININE 1.00 0.96  CALCIUM  --  8.9   Results for orders placed or performed in visit on 01/18/16  Respiratory or Resp and Sputum Culture     Status: None   Collection Time: 01/18/16  6:53 PM   Specimen: Blood  Result Value Ref Range Status   Culture Abundant STAPHYLOCOCCUS AUREUS  Final   Gram Stain Few  Final   Gram Stain WBC present-predominately Mononuclear  Final   Gram Stain Rare Squamous Epithelial Cells Present  Final   Gram Stain Abundant Gram Positive Cocci In Pairs  Final   Gram Stain   Final    Few Gram Negative Rods This specimen is acceptable for Sputum Culture   Organism ID, Bacteria STAPHYLOCOCCUS AUREUS  Final    Comment: Rifampin and Gentamicin should not be used as single drugs for treatment of Staph  infections. This organism is presumed to be Clindamycin resistant based on detection of inducible Clindamycin resistance.       Susceptibility   Staphylococcus aureus -  (no method available)    OXACILLIN 0.5 Sensitive     CEFAZOLIN  Sensitive     GENTAMICIN <=0.5 Sensitive     CIPROFLOXACIN <=0.5 Sensitive     LEVOFLOXACIN <=0.12 Sensitive      MOXIFLOXACIN <=0.25 Sensitive     TRIMETH/SULFA <=10 Sensitive     VANCOMYCIN 1 Sensitive     CLINDAMYCIN  Resistant     ERYTHROMYCIN >=8 Resistant     LINEZOLID 2 Sensitive     QUINUPRISTIN/DALF 0.5 Sensitive     RIFAMPIN <=0.5 Sensitive     TETRACYCLINE <=1 Sensitive    Disposition: Home  Discharge instruction: The patient was instructed to be ambulatory but told to refrain from heavy lifting, strenuous activity, or driving.  Foley care instructions provided by nursing.  Discharge medications:  Allergies as of 12/13/2022       Reactions   Tramadol Hcl Nausea Only        Medication List     TAKE these medications    acetaminophen 500 MG tablet Commonly known as: TYLENOL Take 1,000 mg by mouth every 6 (six) hours as needed.   docusate sodium 100 MG capsule Commonly known as: Colace Take 1 capsule (100 mg total) by mouth 2 (two) times daily.   famotidine 20 MG tablet Commonly known as: PEPCID Take 1 tablet (20 mg total) by mouth 2 (two) times daily. What changed: when to take this   hydrochlorothiazide 25 MG tablet Commonly known as: HYDRODIURIL TAKE 1 TABLET (25 MG TOTAL) BY MOUTH DAILY.   HYDROcodone-acetaminophen 5-325 MG tablet Commonly known as: NORCO/VICODIN Take 1-2 tablets by mouth every 4 (four) hours as needed for moderate pain.   multivitamin with minerals tablet Take 1 tablet by mouth daily.   oxybutynin 5 MG tablet Commonly known as: DITROPAN Take 1 tablet (5 mg total) by mouth every 8 (eight) hours as needed for bladder spasms.   potassium chloride 10 MEQ tablet Commonly known as: KLOR-CON Take 2 tablets (20 mEq) today, then 1 tablet (10 mEq) daily until surgery. Be sure to take dose on day of surgery. Follow up with PCP for repeat labs.   RED YEAST RICE PO Take 1 tablet by mouth daily.        Followup:   Follow-up Information     Carman Ching, [place]. Go on 12/19/2022.   Specialty: Urology Why: For catheter removal Contact  information: [address] [place] [address] [phone]

## 2022-12-13 NOTE — Progress Notes (Signed)
Leg bag applied. Education regarding emptying cath and cath care completed. IV removed. Discharge education completed. Patient being assisted by spouse to get dressed.  Tyler Peters

## 2022-12-13 NOTE — TOC CM/SW Note (Signed)
  Transition of Care (TOC) Screening Note   Patient Details  Name: Tyler Peters Date of Birth: 1961-06-08   Transition of Care [facility]) CM/SW Contact:    Allena Katz, LCSW Phone Number: 12/13/2022, 10:26 AM    Transition of Care Department [facility]) has reviewed patient and no TOC needs have been identified at this time. We will continue to monitor patient advancement through interdisciplinary progression rounds. If new patient transition needs arise, please place a TOC consult.

## 2022-12-13 NOTE — Progress Notes (Signed)
Patient being wheeled to the exit by volunteers for discharge to the care of his family in stable condition.  Tyler Peters

## 2022-12-13 NOTE — Plan of Care (Signed)
Patient is adequate for discharge. Resolve Plan of Care.  [name] [name]

## 2022-12-16 LAB — SURGICAL PATHOLOGY

## 2022-12-19 ENCOUNTER — Ambulatory Visit (INDEPENDENT_AMBULATORY_CARE_PROVIDER_SITE_OTHER): Payer: BC Managed Care – PPO | Admitting: Physician Assistant

## 2022-12-19 ENCOUNTER — Encounter: Payer: Self-pay | Admitting: Physician Assistant

## 2022-12-19 VITALS — BP 130/88 | HR 111 | Ht 68.0 in

## 2022-12-19 DIAGNOSIS — C61 Malignant neoplasm of prostate: Secondary | ICD-10-CM

## 2022-12-19 NOTE — Progress Notes (Signed)
Catheter Removal  Patient is present today for a catheter removal.  11ml of water was drained from the balloon. A 20FR foley cath was removed from the bladder, no complications were noted. Patient tolerated well.  Performed by: Carman Ching, PA-C  Additional notes: Minimal pain.  He is having BMs and passing flatus.  No nausea or vomiting and he continues to tolerate p.o.  No shortness of breath, chest pain, palpitations, or leg swelling.  Abdomen is soft and nontender with well-healing surgical incisions.  There is some suprapubic bruising, which appears to be resolving.  We discussed surgical pathology with grade group 3 prostate cancer with a positive right margin and negative lymph nodes.  We discussed postop follow-up in 6 weeks with PSA prior.  We discussed anticipated erectile dysfunction and urinary leakage postop and at the first year after prostatectomy will tell us the most in terms of recovery of urinary and erectile function.  I encouraged him to wear absorbent products as needed for security.  All questions answered.  Follow up: Return in about 6 weeks (around 01/30/2023) for Postop follow-up with PSA prior.

## 2022-12-20 ENCOUNTER — Telehealth: Payer: Self-pay | Admitting: *Deleted

## 2022-12-20 NOTE — Telephone Encounter (Signed)
Patient left a message at lunch so I  call him back after lunch , patient states he was able to urinate this morning around get up. He has not been ablt to go since than . He took a oxbutynin  than was able to finally go to the restrrom . I talked with Sam and she said for him not to take anymore Oxbutynin because he may not urinate any more. To call the office if he starts having pain in his lower abdominal area. Push  fluids.

## 2023-01-19 ENCOUNTER — Other Ambulatory Visit: Payer: Self-pay

## 2023-01-19 ENCOUNTER — Other Ambulatory Visit: Payer: BC Managed Care – PPO

## 2023-01-19 DIAGNOSIS — R972 Elevated prostate specific antigen [PSA]: Secondary | ICD-10-CM

## 2023-01-20 ENCOUNTER — Other Ambulatory Visit: Payer: BC Managed Care – PPO

## 2023-01-20 LAB — PSA: Prostate Specific Ag, Serum: 0.5 ng/mL (ref 0.0–4.0)

## 2023-01-23 ENCOUNTER — Telehealth: Payer: Self-pay

## 2023-01-23 DIAGNOSIS — I10 Essential (primary) hypertension: Secondary | ICD-10-CM

## 2023-01-23 MED ORDER — HYDROCHLOROTHIAZIDE 25 MG PO TABS
25.0000 mg | ORAL_TABLET | Freq: Every day | ORAL | 1 refills | Status: DC
Start: 2023-01-23 — End: 2023-04-26

## 2023-01-23 NOTE — Telephone Encounter (Signed)
Prescription Request  01/23/2023  LOV: Visit date not found  What is the name of the medication or equipment?  hydrochlorothiazide (HYDRODIURIL) 25 MG tablet   Have you contacted your pharmacy to request a refill? Yes   Which pharmacy would you like this sent to?  [id number] [address] [place] [address] Phone: [phone] Fax: [phone]    Patient notified that their request is being sent to the clinical staff for review and that they should receive a response within 2 business days.   Please advise at Mobile [phone] (mobile)  Patient states we sent this prescription in January, 2024, but [facility] stated they did not receive it and asked patient to contact us.  Patient states he was ahead on his medication, so he is just now getting ready to run out.

## 2023-01-23 NOTE — Telephone Encounter (Signed)
Medication has been refilled.

## 2023-01-26 ENCOUNTER — Encounter: Payer: Self-pay | Admitting: Urology

## 2023-01-26 ENCOUNTER — Ambulatory Visit (INDEPENDENT_AMBULATORY_CARE_PROVIDER_SITE_OTHER): Payer: BC Managed Care – PPO | Admitting: Urology

## 2023-01-26 VITALS — BP 149/92 | HR 105 | Ht 68.0 in | Wt 217.0 lb

## 2023-01-26 DIAGNOSIS — C61 Malignant neoplasm of prostate: Secondary | ICD-10-CM

## 2023-01-26 DIAGNOSIS — Z08 Encounter for follow-up examination after completed treatment for malignant neoplasm: Secondary | ICD-10-CM

## 2023-01-26 DIAGNOSIS — Z8546 Personal history of malignant neoplasm of prostate: Secondary | ICD-10-CM

## 2023-01-26 NOTE — Progress Notes (Signed)
   01/26/2023 4:19 PM   Tyler Peters 11-21-1960 [id number]  Reason for visit: Follow up prostate cancer status post prostatectomy  HPI: Healthy 62 year old male who presented with an elevated PSA of 4.7(8.7% free) and was found to have high risk prostate cancer on biopsy, primarily right-sided disease.  CT and bone scan showed no evidence of metastatic disease.  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  He has done well since surgery, denies any abdominal pain, urinating with strong stream, continues to have moderate to severe stress incontinence, has not yet started kegel exercises or PT. Recommended trying Cunningham clamp for the next few weeks to months as he starts to work on pelvic floor therapy.  Post op PSA 0.5 consistent with persistent/residual disease.  We discussed the need for radiation in the setting of a persistent PSA with positive margin, as well as 6 months of ADT.  Risk and benefits discussed extensively, strongly recommend pursuing radiation as quickly as possible with his very young age and good health.  Will coordinate 106-month ADT injection, referral placed to radiation oncology Instructed on Kegel exercises, trial Cunningham clamp Consider referral to pelvic floor PT in the future Follow-up with urology in 5 to 6 months with PSA prior  Sondra Come, MD  [facility] [address], [address] [place], [place] [address] [phone]

## 2023-01-26 NOTE — Patient Instructions (Signed)
Recommend ordering a Cunningham or Wiesner clamp from [employer], this can help with leakage during the day when you are active.  The, different sizes and types, would recommend ordering a few different types to see what works best.  As you continue to work on Kegel exercises, can use the clamp less and less as continence improves.  Kegel Exercises  Kegel exercises can help strengthen your pelvic floor muscles. The pelvic floor is a group of muscles that support your rectum, small intestine, and bladder. In females, pelvic floor muscles also help support the uterus. These muscles help you control the flow of urine and stool (feces). Kegel exercises are painless and simple. They do not require any equipment. Your provider may suggest Kegel exercises to: Improve bladder and bowel control. Improve sexual response. Improve weak pelvic floor muscles after surgery to remove the uterus (hysterectomy) or after pregnancy, in females. Improve weak pelvic floor muscles after prostate gland removal or surgery, in males. Kegel exercises involve squeezing your pelvic floor muscles. These are the same muscles you squeeze when you try to stop the flow of urine or keep from passing gas. The exercises can be done while sitting, standing, or lying down, but it is best to vary your position. Ask your health care provider which exercises are safe for you. Do exercises exactly as told by your health care provider and adjust them as directed. Do not begin these exercises until told by your health care provider. Exercises How to do Kegel exercises: Squeeze your pelvic floor muscles tight. You should feel a tight lift in your rectal area. If you are a male, you should also feel a tightness in your vaginal area. Keep your stomach, buttocks, and legs relaxed. Hold the muscles tight for up to 10 seconds. Breathe normally. Relax your muscles for up to 10 seconds. Repeat as told by your health care provider. Repeat this  exercise daily as told by your health care provider. Continue to do this exercise for at least 4-6 weeks, or for as long as told by your health care provider. You may be referred to a physical therapist who can help you learn more about how to do Kegel exercises. Depending on your condition, your health care provider may recommend: Varying how long you squeeze your muscles. Doing several sets of exercises every day. Doing exercises for several weeks. Making Kegel exercises a part of your regular exercise routine. This information is not intended to replace advice given to you by your health care provider. Make sure you discuss any questions you have with your health care provider. Document Revised: 11/26/2020 Document Reviewed: 11/26/2020 Elsevier Patient Education  2024 [employer].

## 2023-02-08 ENCOUNTER — Encounter: Payer: Self-pay | Admitting: Radiation Oncology

## 2023-02-08 ENCOUNTER — Telehealth: Payer: Self-pay | Admitting: Urology

## 2023-02-08 ENCOUNTER — Ambulatory Visit
Admission: RE | Admit: 2023-02-08 | Discharge: 2023-02-08 | Disposition: A | Discharge status: Home / Self Care | Payer: BC Managed Care – PPO | Source: Ambulatory Visit | Attending: Radiation Oncology | Admitting: Radiation Oncology

## 2023-02-08 VITALS — BP 141/98 | HR 101 | Temp 98.0°F | Resp 16 | Wt 218.0 lb

## 2023-02-08 DIAGNOSIS — Z510 Encounter for antineoplastic radiation therapy: Secondary | ICD-10-CM | POA: Insufficient documentation

## 2023-02-08 DIAGNOSIS — C61 Malignant neoplasm of prostate: Secondary | ICD-10-CM | POA: Insufficient documentation

## 2023-02-08 DIAGNOSIS — Z191 Hormone sensitive malignancy status: Secondary | ICD-10-CM | POA: Diagnosis not present

## 2023-02-08 NOTE — Telephone Encounter (Signed)
ADT is Eligard, the cancer center just sent [place] a message this morning stating he will need it. I will get it authorized and call him to get it scheduled.

## 2023-02-08 NOTE — Progress Notes (Signed)
Radiation Oncology Follow up Note  Name: Tyler Peters   Date:   02/08/2023 MRN:  [id number] DOB: Aug 28, 1960    This 62 y.o. 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  At that time he presented with a clinical stage IIc (cT1 cN0 M0) Gleason 7 (4+3) adenocarcinoma presenting with a PSA of 4.7.  He opted for prostatectomy.  At the time of prostatectomy he had acinar adenocarcinoma Gleason score of 7 (4+3) with margins involved at the right apical margin.  There was no seminal vesicle involvement or lymphovascular invasion.  2 lymph nodes were sampled were negative for metastatic disease.  He did well postoperatively although does have urinary incontinence.  His postop PSA is 0.5.  He is currently trying and Kegel exercises to improve his urinary incontinence.  He is now referred to ration oncology for consideration of salvage treatment.  COMPLICATIONS OF TREATMENT: none  FOLLOW UP COMPLIANCE: keeps appointments   PHYSICAL EXAM:  BP (!) 141/98   Pulse (!) 101   Temp 98 F (36.7 C) (Tympanic)   Resp 16   Wt 218 lb (98.9 kg)   BMI 33.15 kg/m  Well-developed well-nourished patient in NAD. HEENT reveals PERLA, EOMI, discs not visualized.  Oral cavity is clear. No oral mucosal lesions are identified. Neck is clear without evidence of cervical or supraclavicular adenopathy. Lungs are clear to A&P. Cardiac examination is essentially unremarkable with regular rate and rhythm without murmur rub or thrill. Abdomen is benign with no organomegaly or masses noted. Motor sensory and DTR levels are equal and symmetric in the upper and lower extremities. Cranial nerves II through XII are grossly intact.  Proprioception is intact. No peripheral adenopathy or edema is identified. No motor or sensory levels are noted. Crude visual fields are within normal range.  RADIOLOGY RESULTS: Previous CT scan preoperatively has been reviewed. PLAN: At this time I would offer salvage radiation therapy to his prostatic fossa and pelvic lymph nodes.  I run the [facility] nomogram based on the upstaging to a T3a lesion which does show a 24% chance of lymph node involvement.  I would treat his prostatic fossa to 70 Gray over 7 weeks treating his pelvic lymph nodes to 50 Gray using IMRT dose painting technique.  Risks and benefits of treatment including increased lower urinary tract symptoms diarrhea fatigue alteration of blood skin reaction all were discussed with the patient and his wife.  They both comprehend my treatment plan well.  I have asked him to return to urology for a 75-month Eligard injection.  We will set up simulation for the next several weeks and start his treatments after August 1 to allow ADT therapy to be initiated.  I would like to take this opportunity to thank you for allowing me to participate in the care of your patient.Carmina Miller, MD

## 2023-02-08 NOTE — Telephone Encounter (Signed)
Pt stopped by office asking about an injection (3-month ADT injection).  I am not familiar with this.  Is something we do here in office?  I told pt I would send you a phone message to call him back to coordinate.

## 2023-02-09 DIAGNOSIS — C61 Malignant neoplasm of prostate: Secondary | ICD-10-CM | POA: Diagnosis not present

## 2023-02-09 DIAGNOSIS — Z191 Hormone sensitive malignancy status: Secondary | ICD-10-CM | POA: Diagnosis not present

## 2023-02-15 ENCOUNTER — Ambulatory Visit
Admission: RE | Admit: 2023-02-15 | Discharge: 2023-02-15 | Disposition: A | Discharge status: Home / Self Care | Payer: BC Managed Care – PPO | Source: Ambulatory Visit | Attending: Radiation Oncology | Admitting: Radiation Oncology

## 2023-02-15 DIAGNOSIS — C61 Malignant neoplasm of prostate: Secondary | ICD-10-CM | POA: Diagnosis not present

## 2023-02-15 DIAGNOSIS — Z510 Encounter for antineoplastic radiation therapy: Secondary | ICD-10-CM | POA: Diagnosis not present

## 2023-02-15 DIAGNOSIS — Z191 Hormone sensitive malignancy status: Secondary | ICD-10-CM | POA: Diagnosis not present

## 2023-02-18 DIAGNOSIS — Z510 Encounter for antineoplastic radiation therapy: Secondary | ICD-10-CM | POA: Diagnosis not present

## 2023-02-18 DIAGNOSIS — C61 Malignant neoplasm of prostate: Secondary | ICD-10-CM | POA: Diagnosis not present

## 2023-02-18 DIAGNOSIS — Z191 Hormone sensitive malignancy status: Secondary | ICD-10-CM | POA: Diagnosis not present

## 2023-02-20 ENCOUNTER — Telehealth: Payer: Self-pay

## 2023-02-20 NOTE — Telephone Encounter (Signed)
PA initiated via phone with [employer] for coverage of Eligard.   CPT [phone]- No auth required  [id number] - Auth required, PA sumbitted, tentative auth # [id number]   Will submit clinicals.

## 2023-02-22 ENCOUNTER — Encounter: Payer: Self-pay | Admitting: Urology

## 2023-02-22 ENCOUNTER — Ambulatory Visit (INDEPENDENT_AMBULATORY_CARE_PROVIDER_SITE_OTHER): Payer: BC Managed Care – PPO | Admitting: Urology

## 2023-02-22 VITALS — BP 154/94 | HR 90 | Ht 67.0 in | Wt 223.0 lb

## 2023-02-22 DIAGNOSIS — C61 Malignant neoplasm of prostate: Secondary | ICD-10-CM | POA: Diagnosis not present

## 2023-02-22 DIAGNOSIS — N393 Stress incontinence (female) (male): Secondary | ICD-10-CM

## 2023-02-22 MED ORDER — LEUPROLIDE ACETATE (6 MONTH) 45 MG ~~LOC~~ KIT
45.0000 mg | PACK | Freq: Once | SUBCUTANEOUS | Status: AC
Start: 2023-02-22 — End: 2023-02-22
  Administered 2023-02-22: 45 mg via SUBCUTANEOUS

## 2023-02-22 NOTE — Patient Instructions (Addendum)
Hormone Suppression Therapy for Prostate Cancer  Hormone suppression therapy is a treatment for prostate cancer that can help slow the growth of cancer cells in the prostate gland. It is also called androgen deprivation therapy (ADT) or androgen suppression therapy. Hormone suppression therapy targets male sex hormones (androgens) in the body that help cancer cells grow. Hormone suppression therapy alone will not cure prostate cancer, but it can slow the growth of cancer cells and may shrink tumors over time. Your health care provider can help you find the best treatment that fits your lifestyle. Hormone suppression therapy may be used in the following cases: When prostate cancer has spread too far to other places in the body and cannot be cured by surgery or radiation. When a person has health problems that prevent the use of surgery or radiation. Before radiation to help shrink the size of the cancer and make the radiation treatment more effective. If the prostate cancer remains or comes back following treatment with surgery or radiation. What are the types of hormone suppression therapy? Orchiectomy Orchiectomy, also called surgical castration, is a surgery to remove one or both testicles. The testicles make the two main androgens--testosterone and dihydrotestosterone (DHT). This surgery reduces the levels of testosterone in the blood, leading to decreased androgen production. Medicine therapy Medicine therapy, also called medical or chemical castration, involves taking medicines to keep your body from making or using androgens. Medicines can do this in one of three ways: Reducing androgen production by the testicles. Luteinizing hormone-releasing hormone [facility]) agonists. These medicines are injected or implanted under your skin to lower the amount of androgens that your testicles make. Depending on the medicine, they can be given monthly or up to every 3 to 6 months. If you take these medicines,  you may also be prescribed other medicines to help with side effects. LHRH antagonists. These medicines also work to lower the amount of androgens made in the testicles, but they work faster than [facility] agonist medicines and have less severe side effects. They are given as a monthly injection under the skin, and they are used when prostate cancer is in an advanced stage. Estrogens. These medicines are male hormones that help to reduce androgen production by the testicles. Estrogens are not used as commonly as other types of hormone suppression therapy due to their side effects. However, they may be used if other treatments do not work. Blocking androgen attachment throughout the body. Anti-androgen medicines, also called androgen receptor antagonists, block areas on the body where androgens attach. These are pills that are usually used in combination with other types of hormone suppression therapy, like orchiectomy and other medicines. Blocking androgen production throughout the body. Androgen synthesis inhibitor medicines. These medicines help to stop other areas of the body from making androgens. They are taken as pills. They may be used if the prostate cancer is advanced and has not gotten better with surgery or other medicines. A steroid medicine may be given with this type of medicine to help with side effects. What are the risks? Hormone suppression therapy may cause side effects, including: Hot flashes. Diarrhea and nausea. Itching. Sexual side effects, such as: Decrease or lack of sexual desire. Decrease in size of the penis or testicles. Inability to get an erection (erectile dysfunction, or impotence). Breast tenderness or increase in breast size. Fatigue. Weight gain. Anemia. Thinning of the bones (osteoporosis) and loss of muscle mass. Depression, mood swings, and trouble with thinking or focusing. Hormone suppression therapy may also increase  your risk of high blood pressure,  increased cholesterol levels, stroke, heart attack, or diabetes. What are the benefits? One of the main benefits of hormone suppression therapy is having additional treatment options. You may have only one type of treatment, or two or more types at the same time. Treatments may be combined to: Help with side effects. Treat advanced cancer. Where to find more information American Cancer Society: www.cancer.org National Cancer Institute: www.cancer.gov Contact a health care provider if: You have pain or side effects that do not get better with treatment. You have trouble urinating. You have new side effects that do not go away. Get help right away if: You have severe chest pain. You have trouble breathing. You have an irregular heartbeat. You have numbness or paralysis in the lower half of your body. You are confused. You have trouble talking or understanding speech. These symptoms may be an emergency. Do not wait to see if the symptoms will go away. Get medical help right away. Call your local emergency services (911 in the [place] Do not drive yourself to the hospital. Summary Hormone suppression therapy is a treatment for prostate cancer that can help to slow the growth of cancer cells in the prostate gland. Hormone suppression therapy alone will not cure prostate cancer, but it can slow the growth of prostate cancer and may shrink tumors over time. Treatment to suppress hormones may include surgery or medicines. Side effects such as hot flashes, changes in sexual function or desire, and weakened bones can result from hormone suppression therapy. This information is not intended to replace advice given to you by your health care provider. Make sure you discuss any questions you have with your health care provider. Document Revised: 10/29/2020 Document Reviewed: 10/29/2020 Elsevier Patient Education  2024 [employer].

## 2023-02-22 NOTE — Progress Notes (Signed)
   02/22/2023 2:33 PM   Tyler Peters May 01, 1961 [id number]  Reason for visit: Follow up prostate cancer, stress incontinence  HPI: Healthy 62 year old male who presented with an elevated PSA of 4.7(8.7% free) and was found to have high risk prostate cancer on biopsy, primarily right-sided disease.  CT and bone scan showed no evidence of metastatic disease.  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   He has moderate stress incontinence since surgery, does feel things have improved somewhat over the last 1 to 2 weeks.  Referral placed to pelvic floor physical therapy.  Post op PSA 0.5 consistent with persistent/residual disease.  He is already met with radiation oncology who recommended salvage radiation, here today for 40-month ADT injection.  Risks and benefits of ADT discussed at length including fatigue, low testosterone, hot flashes/night sweats, ED.  42-month ADT injection given today, follow-up for salvage radiation RTC November 2024 with PSA prior  Sondra Come, MD  [facility] [address], [address] [place], [place] [address] [phone]

## 2023-02-22 NOTE — Progress Notes (Signed)
Patient ID: Tyler Peters, male   DOB: 27-Jun-1961, 62 y.o.   MRN: [id number] Eligard SubQ Injection   Due to Prostate Cancer patient is present today for a Eligard Injection.  Medication: Eligard 6 month Dose: 45 mg  Location: right  Lot: [id number] Exp: 07/01/24  Patient tolerated well, no complications were noted  Performed by: Mervin Hack, CMA  Per Dr. Richardo Hanks patient's next follow up was scheduled for 11/24. This appointment was scheduled using wheel and given to patient today along with reminder continue on Vitamin D 800-1000iu and Calcium 1000-1200mg  daily

## 2023-02-24 NOTE — Telephone Encounter (Signed)
Incoming approval of Eligard auth via phone  Dates: 02/20/23 - 02/20/24  Auth # [id number]

## 2023-03-02 ENCOUNTER — Ambulatory Visit: Payer: BC Managed Care – PPO

## 2023-03-03 ENCOUNTER — Other Ambulatory Visit: Payer: Self-pay | Admitting: *Deleted

## 2023-03-03 DIAGNOSIS — C61 Malignant neoplasm of prostate: Secondary | ICD-10-CM

## 2023-03-06 ENCOUNTER — Ambulatory Visit: Admission: RE | Admit: 2023-03-06 | Payer: BC Managed Care – PPO | Source: Ambulatory Visit

## 2023-03-06 ENCOUNTER — Ambulatory Visit: Payer: BC Managed Care – PPO

## 2023-03-07 ENCOUNTER — Other Ambulatory Visit: Payer: Self-pay

## 2023-03-07 ENCOUNTER — Ambulatory Visit
Admission: RE | Admit: 2023-03-07 | Discharge: 2023-03-07 | Disposition: A | Discharge status: Home / Self Care | Payer: BC Managed Care – PPO | Source: Ambulatory Visit | Attending: Radiation Oncology | Admitting: Radiation Oncology

## 2023-03-07 DIAGNOSIS — C61 Malignant neoplasm of prostate: Secondary | ICD-10-CM | POA: Diagnosis not present

## 2023-03-07 DIAGNOSIS — Z510 Encounter for antineoplastic radiation therapy: Secondary | ICD-10-CM | POA: Diagnosis not present

## 2023-03-07 DIAGNOSIS — Z191 Hormone sensitive malignancy status: Secondary | ICD-10-CM | POA: Diagnosis not present

## 2023-03-07 LAB — RAD ONC ARIA SESSION SUMMARY
Course Elapsed Days: 0
Plan Fractions Treated to Date: 1
Plan Prescribed Dose Per Fraction: 2 Gy
Plan Total Fractions Prescribed: 35
Plan Total Prescribed Dose: 70 Gy
Reference Point Dosage Given to Date: 2 Gy
Reference Point Session Dosage Given: 2 Gy
Session Number: 1

## 2023-03-08 ENCOUNTER — Ambulatory Visit
Admission: RE | Admit: 2023-03-08 | Discharge: 2023-03-08 | Disposition: A | Discharge status: Home / Self Care | Payer: BC Managed Care – PPO | Source: Ambulatory Visit | Attending: Radiation Oncology | Admitting: Radiation Oncology

## 2023-03-08 ENCOUNTER — Other Ambulatory Visit: Payer: Self-pay

## 2023-03-08 DIAGNOSIS — Z510 Encounter for antineoplastic radiation therapy: Secondary | ICD-10-CM | POA: Diagnosis not present

## 2023-03-08 DIAGNOSIS — Z191 Hormone sensitive malignancy status: Secondary | ICD-10-CM | POA: Diagnosis not present

## 2023-03-08 DIAGNOSIS — C61 Malignant neoplasm of prostate: Secondary | ICD-10-CM | POA: Diagnosis not present

## 2023-03-08 LAB — RAD ONC ARIA SESSION SUMMARY
Course Elapsed Days: 1
Plan Fractions Treated to Date: 2
Plan Prescribed Dose Per Fraction: 2 Gy
Plan Total Fractions Prescribed: 35
Plan Total Prescribed Dose: 70 Gy
Reference Point Dosage Given to Date: 4 Gy
Reference Point Session Dosage Given: 2 Gy
Session Number: 2

## 2023-03-09 ENCOUNTER — Ambulatory Visit
Admission: RE | Admit: 2023-03-09 | Discharge: 2023-03-09 | Disposition: A | Discharge status: Home / Self Care | Payer: BC Managed Care – PPO | Source: Ambulatory Visit | Attending: Radiation Oncology | Admitting: Radiation Oncology

## 2023-03-09 ENCOUNTER — Other Ambulatory Visit: Payer: Self-pay

## 2023-03-09 DIAGNOSIS — Z191 Hormone sensitive malignancy status: Secondary | ICD-10-CM | POA: Diagnosis not present

## 2023-03-09 DIAGNOSIS — Z510 Encounter for antineoplastic radiation therapy: Secondary | ICD-10-CM | POA: Diagnosis not present

## 2023-03-09 DIAGNOSIS — C61 Malignant neoplasm of prostate: Secondary | ICD-10-CM | POA: Diagnosis not present

## 2023-03-09 LAB — RAD ONC ARIA SESSION SUMMARY
Course Elapsed Days: 2
Plan Fractions Treated to Date: 3
Plan Prescribed Dose Per Fraction: 2 Gy
Plan Total Fractions Prescribed: 35
Plan Total Prescribed Dose: 70 Gy
Reference Point Dosage Given to Date: 6 Gy
Reference Point Session Dosage Given: 2 Gy
Session Number: 3

## 2023-03-10 ENCOUNTER — Ambulatory Visit: Admission: RE | Admit: 2023-03-10 | Payer: BC Managed Care – PPO | Source: Ambulatory Visit

## 2023-03-10 ENCOUNTER — Other Ambulatory Visit: Payer: Self-pay

## 2023-03-10 DIAGNOSIS — Z510 Encounter for antineoplastic radiation therapy: Secondary | ICD-10-CM | POA: Diagnosis not present

## 2023-03-10 DIAGNOSIS — Z191 Hormone sensitive malignancy status: Secondary | ICD-10-CM | POA: Diagnosis not present

## 2023-03-10 DIAGNOSIS — C61 Malignant neoplasm of prostate: Secondary | ICD-10-CM | POA: Diagnosis not present

## 2023-03-10 LAB — RAD ONC ARIA SESSION SUMMARY
Course Elapsed Days: 3
Plan Fractions Treated to Date: 4
Plan Prescribed Dose Per Fraction: 2 Gy
Plan Total Fractions Prescribed: 35
Plan Total Prescribed Dose: 70 Gy
Reference Point Dosage Given to Date: 8 Gy
Reference Point Session Dosage Given: 2 Gy
Session Number: 4

## 2023-03-13 ENCOUNTER — Other Ambulatory Visit: Payer: Self-pay

## 2023-03-13 ENCOUNTER — Ambulatory Visit
Admission: RE | Admit: 2023-03-13 | Discharge: 2023-03-13 | Disposition: A | Discharge status: Home / Self Care | Payer: BC Managed Care – PPO | Source: Ambulatory Visit | Attending: Radiation Oncology | Admitting: Radiation Oncology

## 2023-03-13 DIAGNOSIS — Z191 Hormone sensitive malignancy status: Secondary | ICD-10-CM | POA: Diagnosis not present

## 2023-03-13 DIAGNOSIS — Z510 Encounter for antineoplastic radiation therapy: Secondary | ICD-10-CM | POA: Diagnosis not present

## 2023-03-13 DIAGNOSIS — C61 Malignant neoplasm of prostate: Secondary | ICD-10-CM | POA: Diagnosis not present

## 2023-03-13 LAB — RAD ONC ARIA SESSION SUMMARY
Course Elapsed Days: 6
Plan Fractions Treated to Date: 5
Plan Prescribed Dose Per Fraction: 2 Gy
Plan Total Fractions Prescribed: 35
Plan Total Prescribed Dose: 70 Gy
Reference Point Dosage Given to Date: 10 Gy
Reference Point Session Dosage Given: 2 Gy
Session Number: 5

## 2023-03-14 ENCOUNTER — Ambulatory Visit
Admission: RE | Admit: 2023-03-14 | Discharge: 2023-03-14 | Disposition: A | Discharge status: Home / Self Care | Payer: BC Managed Care – PPO | Source: Ambulatory Visit | Attending: Radiation Oncology | Admitting: Radiation Oncology

## 2023-03-14 ENCOUNTER — Other Ambulatory Visit: Payer: Self-pay

## 2023-03-14 DIAGNOSIS — Z191 Hormone sensitive malignancy status: Secondary | ICD-10-CM | POA: Diagnosis not present

## 2023-03-14 DIAGNOSIS — Z510 Encounter for antineoplastic radiation therapy: Secondary | ICD-10-CM | POA: Diagnosis not present

## 2023-03-14 DIAGNOSIS — C61 Malignant neoplasm of prostate: Secondary | ICD-10-CM | POA: Diagnosis not present

## 2023-03-14 LAB — RAD ONC ARIA SESSION SUMMARY
Course Elapsed Days: 7
Plan Fractions Treated to Date: 6
Plan Prescribed Dose Per Fraction: 2 Gy
Plan Total Fractions Prescribed: 35
Plan Total Prescribed Dose: 70 Gy
Reference Point Dosage Given to Date: 12 Gy
Reference Point Session Dosage Given: 2 Gy
Session Number: 6

## 2023-03-15 ENCOUNTER — Ambulatory Visit
Admission: RE | Admit: 2023-03-15 | Discharge: 2023-03-15 | Disposition: A | Discharge status: Home / Self Care | Payer: BC Managed Care – PPO | Source: Ambulatory Visit | Attending: Radiation Oncology | Admitting: Radiation Oncology

## 2023-03-15 ENCOUNTER — Other Ambulatory Visit: Payer: Self-pay

## 2023-03-15 DIAGNOSIS — C61 Malignant neoplasm of prostate: Secondary | ICD-10-CM | POA: Diagnosis not present

## 2023-03-15 DIAGNOSIS — Z510 Encounter for antineoplastic radiation therapy: Secondary | ICD-10-CM | POA: Diagnosis not present

## 2023-03-15 DIAGNOSIS — Z191 Hormone sensitive malignancy status: Secondary | ICD-10-CM | POA: Diagnosis not present

## 2023-03-15 LAB — RAD ONC ARIA SESSION SUMMARY
Course Elapsed Days: 8
Plan Fractions Treated to Date: 7
Plan Prescribed Dose Per Fraction: 2 Gy
Plan Total Fractions Prescribed: 35
Plan Total Prescribed Dose: 70 Gy
Reference Point Dosage Given to Date: 14 Gy
Reference Point Session Dosage Given: 2 Gy
Session Number: 7

## 2023-03-16 ENCOUNTER — Ambulatory Visit
Admission: RE | Admit: 2023-03-16 | Discharge: 2023-03-16 | Disposition: A | Discharge status: Home / Self Care | Payer: BC Managed Care – PPO | Source: Ambulatory Visit | Attending: Radiation Oncology | Admitting: Radiation Oncology

## 2023-03-16 ENCOUNTER — Other Ambulatory Visit: Payer: Self-pay

## 2023-03-16 ENCOUNTER — Encounter (INDEPENDENT_AMBULATORY_CARE_PROVIDER_SITE_OTHER): Payer: Self-pay

## 2023-03-16 ENCOUNTER — Inpatient Hospital Stay: Payer: BC Managed Care – PPO

## 2023-03-16 DIAGNOSIS — C61 Malignant neoplasm of prostate: Secondary | ICD-10-CM | POA: Insufficient documentation

## 2023-03-16 DIAGNOSIS — Z191 Hormone sensitive malignancy status: Secondary | ICD-10-CM | POA: Diagnosis not present

## 2023-03-16 DIAGNOSIS — Z510 Encounter for antineoplastic radiation therapy: Secondary | ICD-10-CM | POA: Diagnosis not present

## 2023-03-16 LAB — RAD ONC ARIA SESSION SUMMARY
Course Elapsed Days: 9
Plan Fractions Treated to Date: 8
Plan Prescribed Dose Per Fraction: 2 Gy
Plan Total Fractions Prescribed: 35
Plan Total Prescribed Dose: 70 Gy
Reference Point Dosage Given to Date: 16 Gy
Reference Point Session Dosage Given: 2 Gy
Session Number: 8

## 2023-03-16 LAB — CBC (CANCER CENTER ONLY)
HCT: 44.3 % (ref 39.0–52.0)
Hemoglobin: 14.6 g/dL (ref 13.0–17.0)
MCH: 28 pg (ref 26.0–34.0)
MCHC: 33 g/dL (ref 30.0–36.0)
MCV: 85 fL (ref 80.0–100.0)
Platelet Count: 248 10*3/uL (ref 150–400)
RBC: 5.21 MIL/uL (ref 4.22–5.81)
RDW: 13.2 % (ref 11.5–15.5)
WBC Count: 7.8 10*3/uL (ref 4.0–10.5)
nRBC: 0 % (ref 0.0–0.2)

## 2023-03-17 ENCOUNTER — Ambulatory Visit
Admission: RE | Admit: 2023-03-17 | Discharge: 2023-03-17 | Disposition: A | Discharge status: Home / Self Care | Payer: BC Managed Care – PPO | Source: Ambulatory Visit | Attending: Radiation Oncology | Admitting: Radiation Oncology

## 2023-03-17 ENCOUNTER — Other Ambulatory Visit: Payer: Self-pay

## 2023-03-17 DIAGNOSIS — Z510 Encounter for antineoplastic radiation therapy: Secondary | ICD-10-CM | POA: Diagnosis not present

## 2023-03-17 DIAGNOSIS — C61 Malignant neoplasm of prostate: Secondary | ICD-10-CM | POA: Diagnosis not present

## 2023-03-17 DIAGNOSIS — Z191 Hormone sensitive malignancy status: Secondary | ICD-10-CM | POA: Diagnosis not present

## 2023-03-17 LAB — RAD ONC ARIA SESSION SUMMARY
Course Elapsed Days: 10
Plan Fractions Treated to Date: 9
Plan Prescribed Dose Per Fraction: 2 Gy
Plan Total Fractions Prescribed: 35
Plan Total Prescribed Dose: 70 Gy
Reference Point Dosage Given to Date: 18 Gy
Reference Point Session Dosage Given: 2 Gy
Session Number: 9

## 2023-03-20 ENCOUNTER — Other Ambulatory Visit: Payer: Self-pay

## 2023-03-20 ENCOUNTER — Ambulatory Visit
Admission: RE | Admit: 2023-03-20 | Discharge: 2023-03-20 | Disposition: A | Discharge status: Home / Self Care | Payer: BC Managed Care – PPO | Source: Ambulatory Visit | Attending: Radiation Oncology | Admitting: Radiation Oncology

## 2023-03-20 DIAGNOSIS — Z510 Encounter for antineoplastic radiation therapy: Secondary | ICD-10-CM | POA: Diagnosis not present

## 2023-03-20 DIAGNOSIS — C61 Malignant neoplasm of prostate: Secondary | ICD-10-CM | POA: Diagnosis not present

## 2023-03-20 DIAGNOSIS — Z191 Hormone sensitive malignancy status: Secondary | ICD-10-CM | POA: Diagnosis not present

## 2023-03-20 LAB — RAD ONC ARIA SESSION SUMMARY
Course Elapsed Days: 13
Plan Fractions Treated to Date: 10
Plan Prescribed Dose Per Fraction: 2 Gy
Plan Total Fractions Prescribed: 35
Plan Total Prescribed Dose: 70 Gy
Reference Point Dosage Given to Date: 20 Gy
Reference Point Session Dosage Given: 2 Gy
Session Number: 10

## 2023-03-21 ENCOUNTER — Ambulatory Visit: Admission: RE | Admit: 2023-03-21 | Payer: BC Managed Care – PPO | Source: Ambulatory Visit

## 2023-03-21 ENCOUNTER — Other Ambulatory Visit: Payer: Self-pay

## 2023-03-21 DIAGNOSIS — C61 Malignant neoplasm of prostate: Secondary | ICD-10-CM | POA: Diagnosis not present

## 2023-03-21 DIAGNOSIS — Z191 Hormone sensitive malignancy status: Secondary | ICD-10-CM | POA: Diagnosis not present

## 2023-03-21 DIAGNOSIS — Z510 Encounter for antineoplastic radiation therapy: Secondary | ICD-10-CM | POA: Diagnosis not present

## 2023-03-21 LAB — RAD ONC ARIA SESSION SUMMARY
Course Elapsed Days: 14
Plan Fractions Treated to Date: 11
Plan Prescribed Dose Per Fraction: 2 Gy
Plan Total Fractions Prescribed: 35
Plan Total Prescribed Dose: 70 Gy
Reference Point Dosage Given to Date: 22 Gy
Reference Point Session Dosage Given: 2 Gy
Session Number: 11

## 2023-03-22 ENCOUNTER — Ambulatory Visit
Admission: RE | Admit: 2023-03-22 | Discharge: 2023-03-22 | Disposition: A | Discharge status: Home / Self Care | Payer: BC Managed Care – PPO | Source: Ambulatory Visit | Attending: Radiation Oncology | Admitting: Radiation Oncology

## 2023-03-22 ENCOUNTER — Other Ambulatory Visit: Payer: Self-pay

## 2023-03-22 DIAGNOSIS — Z510 Encounter for antineoplastic radiation therapy: Secondary | ICD-10-CM | POA: Diagnosis not present

## 2023-03-22 DIAGNOSIS — Z191 Hormone sensitive malignancy status: Secondary | ICD-10-CM | POA: Diagnosis not present

## 2023-03-22 DIAGNOSIS — C61 Malignant neoplasm of prostate: Secondary | ICD-10-CM | POA: Diagnosis not present

## 2023-03-22 LAB — RAD ONC ARIA SESSION SUMMARY
Course Elapsed Days: 15
Plan Fractions Treated to Date: 12
Plan Prescribed Dose Per Fraction: 2 Gy
Plan Total Fractions Prescribed: 35
Plan Total Prescribed Dose: 70 Gy
Reference Point Dosage Given to Date: 24 Gy
Reference Point Session Dosage Given: 2 Gy
Session Number: 12

## 2023-03-23 ENCOUNTER — Ambulatory Visit
Admission: RE | Admit: 2023-03-23 | Discharge: 2023-03-23 | Disposition: A | Discharge status: Home / Self Care | Payer: BC Managed Care – PPO | Source: Ambulatory Visit | Attending: Radiation Oncology | Admitting: Radiation Oncology

## 2023-03-23 ENCOUNTER — Other Ambulatory Visit: Payer: Self-pay

## 2023-03-23 DIAGNOSIS — Z510 Encounter for antineoplastic radiation therapy: Secondary | ICD-10-CM | POA: Diagnosis not present

## 2023-03-23 DIAGNOSIS — Z191 Hormone sensitive malignancy status: Secondary | ICD-10-CM | POA: Diagnosis not present

## 2023-03-23 DIAGNOSIS — C61 Malignant neoplasm of prostate: Secondary | ICD-10-CM | POA: Diagnosis not present

## 2023-03-23 LAB — RAD ONC ARIA SESSION SUMMARY
Course Elapsed Days: 16
Plan Fractions Treated to Date: 13
Plan Prescribed Dose Per Fraction: 2 Gy
Plan Total Fractions Prescribed: 35
Plan Total Prescribed Dose: 70 Gy
Reference Point Dosage Given to Date: 26 Gy
Reference Point Session Dosage Given: 2 Gy
Session Number: 13

## 2023-03-24 ENCOUNTER — Ambulatory Visit
Admission: RE | Admit: 2023-03-24 | Discharge: 2023-03-24 | Disposition: A | Discharge status: Home / Self Care | Payer: BC Managed Care – PPO | Source: Ambulatory Visit | Attending: Radiation Oncology | Admitting: Radiation Oncology

## 2023-03-24 ENCOUNTER — Other Ambulatory Visit: Payer: Self-pay

## 2023-03-24 DIAGNOSIS — Z510 Encounter for antineoplastic radiation therapy: Secondary | ICD-10-CM | POA: Diagnosis not present

## 2023-03-24 DIAGNOSIS — C61 Malignant neoplasm of prostate: Secondary | ICD-10-CM | POA: Diagnosis not present

## 2023-03-24 DIAGNOSIS — Z191 Hormone sensitive malignancy status: Secondary | ICD-10-CM | POA: Diagnosis not present

## 2023-03-24 LAB — RAD ONC ARIA SESSION SUMMARY
Course Elapsed Days: 17
Plan Fractions Treated to Date: 14
Plan Prescribed Dose Per Fraction: 2 Gy
Plan Total Fractions Prescribed: 35
Plan Total Prescribed Dose: 70 Gy
Reference Point Dosage Given to Date: 28 Gy
Reference Point Session Dosage Given: 2 Gy
Session Number: 14

## 2023-03-27 ENCOUNTER — Inpatient Hospital Stay: Payer: BC Managed Care – PPO

## 2023-03-27 ENCOUNTER — Ambulatory Visit
Admission: RE | Admit: 2023-03-27 | Discharge: 2023-03-27 | Disposition: A | Discharge status: Home / Self Care | Payer: BC Managed Care – PPO | Source: Ambulatory Visit | Attending: Radiation Oncology | Admitting: Radiation Oncology

## 2023-03-27 ENCOUNTER — Other Ambulatory Visit: Payer: Self-pay

## 2023-03-27 DIAGNOSIS — Z510 Encounter for antineoplastic radiation therapy: Secondary | ICD-10-CM | POA: Diagnosis not present

## 2023-03-27 DIAGNOSIS — C61 Malignant neoplasm of prostate: Secondary | ICD-10-CM | POA: Diagnosis not present

## 2023-03-27 DIAGNOSIS — Z191 Hormone sensitive malignancy status: Secondary | ICD-10-CM | POA: Diagnosis not present

## 2023-03-27 LAB — RAD ONC ARIA SESSION SUMMARY
Course Elapsed Days: 20
Plan Fractions Treated to Date: 15
Plan Prescribed Dose Per Fraction: 2 Gy
Plan Total Fractions Prescribed: 35
Plan Total Prescribed Dose: 70 Gy
Reference Point Dosage Given to Date: 30 Gy
Reference Point Session Dosage Given: 2 Gy
Session Number: 15

## 2023-03-27 LAB — CBC (CANCER CENTER ONLY)
HCT: 44.6 % (ref 39.0–52.0)
Hemoglobin: 14.7 g/dL (ref 13.0–17.0)
MCH: 28.4 pg (ref 26.0–34.0)
MCHC: 33 g/dL (ref 30.0–36.0)
MCV: 86.1 fL (ref 80.0–100.0)
Platelet Count: 228 10*3/uL (ref 150–400)
RBC: 5.18 MIL/uL (ref 4.22–5.81)
RDW: 13.2 % (ref 11.5–15.5)
WBC Count: 5.5 10*3/uL (ref 4.0–10.5)
nRBC: 0 % (ref 0.0–0.2)

## 2023-03-28 ENCOUNTER — Ambulatory Visit
Admission: RE | Admit: 2023-03-28 | Discharge: 2023-03-28 | Disposition: A | Discharge status: Home / Self Care | Payer: BC Managed Care – PPO | Source: Ambulatory Visit | Attending: Radiation Oncology | Admitting: Radiation Oncology

## 2023-03-28 ENCOUNTER — Other Ambulatory Visit: Payer: Self-pay

## 2023-03-28 ENCOUNTER — Encounter: Payer: Self-pay | Admitting: Family Medicine

## 2023-03-28 ENCOUNTER — Ambulatory Visit (INDEPENDENT_AMBULATORY_CARE_PROVIDER_SITE_OTHER): Payer: BC Managed Care – PPO | Admitting: Family Medicine

## 2023-03-28 VITALS — BP 130/80 | HR 98 | Temp 97.7°F | Resp 16 | Ht 68.0 in | Wt 218.0 lb

## 2023-03-28 DIAGNOSIS — J029 Acute pharyngitis, unspecified: Secondary | ICD-10-CM | POA: Diagnosis not present

## 2023-03-28 DIAGNOSIS — R051 Acute cough: Secondary | ICD-10-CM | POA: Diagnosis not present

## 2023-03-28 DIAGNOSIS — C61 Malignant neoplasm of prostate: Secondary | ICD-10-CM | POA: Diagnosis not present

## 2023-03-28 DIAGNOSIS — J0110 Acute frontal sinusitis, unspecified: Secondary | ICD-10-CM

## 2023-03-28 DIAGNOSIS — Z510 Encounter for antineoplastic radiation therapy: Secondary | ICD-10-CM | POA: Diagnosis not present

## 2023-03-28 DIAGNOSIS — Z191 Hormone sensitive malignancy status: Secondary | ICD-10-CM | POA: Diagnosis not present

## 2023-03-28 LAB — RAD ONC ARIA SESSION SUMMARY
Course Elapsed Days: 21
Plan Fractions Treated to Date: 16
Plan Prescribed Dose Per Fraction: 2 Gy
Plan Total Fractions Prescribed: 35
Plan Total Prescribed Dose: 70 Gy
Reference Point Dosage Given to Date: 32 Gy
Reference Point Session Dosage Given: 2 Gy
Session Number: 16

## 2023-03-28 LAB — POCT RAPID STREP A (OFFICE): Rapid Strep A Screen: NEGATIVE

## 2023-03-28 LAB — POC COVID19 BINAXNOW: SARS Coronavirus 2 Ag: NEGATIVE

## 2023-03-28 MED ORDER — BENZONATATE 200 MG PO CAPS
200.0000 mg | ORAL_CAPSULE | Freq: Two times a day (BID) | ORAL | 0 refills | Status: DC | PRN
Start: 2023-03-28 — End: 2023-04-24

## 2023-03-28 MED ORDER — PREDNISONE 20 MG PO TABS
20.0000 mg | ORAL_TABLET | Freq: Every day | ORAL | 0 refills | Status: AC
Start: 2023-03-28 — End: 2023-04-02

## 2023-03-28 MED ORDER — FLUTICASONE PROPIONATE 50 MCG/ACT NA SUSP
2.0000 | Freq: Every day | NASAL | 6 refills | Status: DC
Start: 2023-03-28 — End: 2024-04-09

## 2023-03-28 NOTE — Patient Instructions (Addendum)
It was a pleasure meeting you today. Thank you for allowing me to take part in your health care.  Our goals for today as we discussed include:  Start Flonase 2 sprays once a day Start Prednisone 20 mg daily for 5 days Start Tessalon Pearls for cough Increase water intake If any worsening symptoms please notify MD  COVID negative Rapid Strep negative Sending for throat culture.  If positive will notify you and treat.  If you have any questions or concerns, please do not hesitate to call the office at [phone].  I look forward to our next visit and until then take care and stay safe.  Regards,   Dana Allan, MD   [facility]

## 2023-03-28 NOTE — Progress Notes (Signed)
SUBJECTIVE:   Chief Complaint  Patient presents with   Nasal Congestion    X 3 weeks got worse on Saturday   Sore Throat   HPI Presents to clinic for acute visit  Symptoms started 3 weeks ago Initially sore scratchy throat, then developed nasal, head and chest congestion. Endorses intermittent morning cough Denies any fevers, shortness of breath, wheezing, chest pain, heart palpitations, decrease in appetite, weight loss.  Appetite unchanged and hydrating well.  No recent travel or sick contacts.   Completed COVID vaccinations Recent COVID test negative   PERTINENT PMH / PSH: Recently completed Radiation x 4 weeks for prostate ca. GERD  OBJECTIVE:  BP 130/80   Pulse 98   Temp 97.7 F (36.5 C)   Resp 16   Ht 5\' 8"  (1.727 m)   Wt 218 lb (98.9 kg)   SpO2 98%   BMI 33.15 kg/m    Physical Exam Vitals reviewed.  Constitutional:      General: He is not in acute distress.    Appearance: He is obese. He is not ill-appearing.  HENT:     Right Ear: Tympanic membrane and ear canal normal.     Left Ear: Tympanic membrane and ear canal normal.     Nose: Congestion and rhinorrhea present. Rhinorrhea is clear.     Right Turbinates: Swollen and pale.     Left Turbinates: Swollen and pale.     Right Sinus: Maxillary sinus tenderness and frontal sinus tenderness present.     Left Sinus: Maxillary sinus tenderness and frontal sinus tenderness present.     Mouth/Throat:     Mouth: Mucous membranes are moist. No oral lesions.     Pharynx: Posterior oropharyngeal erythema and postnasal drip present. No oropharyngeal exudate.     Tonsils: No tonsillar exudate or tonsillar abscesses. 0 on the right. 0 on the left.  Eyes:     Conjunctiva/sclera: Conjunctivae normal.  Neck:     Thyroid: No thyromegaly.  Cardiovascular:     Rate and Rhythm: Normal rate and regular rhythm.     Heart sounds: Normal heart sounds.  Pulmonary:     Effort: Pulmonary effort is normal. No respiratory  distress.     Breath sounds: Normal breath sounds. No wheezing or rhonchi.  Chest:     Chest wall: No tenderness.  Musculoskeletal:     Cervical back: Normal range of motion.  Lymphadenopathy:     Cervical: No cervical adenopathy.  Neurological:     Mental Status: He is alert.        03/28/2023   11:06 AM 10/27/2022    9:43 AM 04/22/2022    1:08 PM 04/15/2021    8:37 AM 05/29/2020   10:07 AM  Depression screen PHQ 2/9  Decreased Interest 0 0 0 0 0  Down, Depressed, Hopeless 0 0 0 0 0  PHQ - 2 Score 0 0 0 0 0  Altered sleeping 0    0  Tired, decreased energy 1    1  Change in appetite 0    0  Feeling bad or failure about yourself  0    0  Trouble concentrating 0    0  Moving slowly or fidgety/restless 0    0  Suicidal thoughts 0    0  PHQ-9 Score 1    1  Difficult doing work/chores Not difficult at all    Not difficult at all       No data to display  ASSESSMENT/PLAN:  Acute non-recurrent frontal sinusitis Assessment & Plan: Symptoms x 3 weeks Afebrile.  Nasal congestion, sinus pressure and swollen turbinates on exam. Start Prednisone 20 mg daily x 5 days Flonase nasal spray daily If develops fever in 1-2 days patient to notify MD and can call in script for Atx Patient agreeable to plan  Orders: -     predniSONE; Take 1 tablet (20 mg total) by mouth daily with breakfast for 5 days.  Dispense: 5 tablet; Refill: 0 -     Fluticasone Propionate; Place 2 sprays into both nostrils daily.  Dispense: 16 g; Refill: 6  Sore throat Assessment & Plan: Mild posterior pharyngeal erythema without exudate.  No cervical lymphadenopathy and afebrile. Rapid strep negative Throat culture sent for confirmation Increase water intake Honey tea for sore throat Cepacol lozenges as needed Follow up with culture results, if positive will treat   Orders: -     POC COVID-19 BinaxNow -     POCT rapid strep A -     Culture, Group A Strep  Acute cough Assessment & Plan: No  acute respiratory distress.  Lungs clear on exam.  Suspect PND, viral URI COVID negative Trial Tessalon Pearls as needed Flonase nasal spray daily Humidified air Increase water intake If no improvement follow up with PCP  Orders: -     Benzonatate; Take 1 capsule (200 mg total) by mouth 2 (two) times daily as needed for cough.  Dispense: 20 capsule; Refill: 0   PDMP reviewed  Return if symptoms worsen or fail to improve, for PCP.  Dana Allan, MD

## 2023-03-29 ENCOUNTER — Other Ambulatory Visit: Payer: Self-pay

## 2023-03-29 ENCOUNTER — Ambulatory Visit: Admission: RE | Admit: 2023-03-29 | Payer: BC Managed Care – PPO | Source: Ambulatory Visit

## 2023-03-29 DIAGNOSIS — Z191 Hormone sensitive malignancy status: Secondary | ICD-10-CM | POA: Diagnosis not present

## 2023-03-29 DIAGNOSIS — C61 Malignant neoplasm of prostate: Secondary | ICD-10-CM | POA: Diagnosis not present

## 2023-03-29 DIAGNOSIS — Z510 Encounter for antineoplastic radiation therapy: Secondary | ICD-10-CM | POA: Diagnosis not present

## 2023-03-29 LAB — RAD ONC ARIA SESSION SUMMARY
Course Elapsed Days: 22
Plan Fractions Treated to Date: 17
Plan Prescribed Dose Per Fraction: 2 Gy
Plan Total Fractions Prescribed: 35
Plan Total Prescribed Dose: 70 Gy
Reference Point Dosage Given to Date: 34 Gy
Reference Point Session Dosage Given: 2 Gy
Session Number: 17

## 2023-03-30 ENCOUNTER — Other Ambulatory Visit: Payer: Self-pay

## 2023-03-30 ENCOUNTER — Ambulatory Visit
Admission: RE | Admit: 2023-03-30 | Discharge: 2023-03-30 | Disposition: A | Discharge status: Home / Self Care | Payer: BC Managed Care – PPO | Source: Ambulatory Visit | Attending: Radiation Oncology | Admitting: Radiation Oncology

## 2023-03-30 DIAGNOSIS — Z510 Encounter for antineoplastic radiation therapy: Secondary | ICD-10-CM | POA: Diagnosis not present

## 2023-03-30 DIAGNOSIS — C61 Malignant neoplasm of prostate: Secondary | ICD-10-CM | POA: Diagnosis not present

## 2023-03-30 DIAGNOSIS — Z191 Hormone sensitive malignancy status: Secondary | ICD-10-CM | POA: Diagnosis not present

## 2023-03-30 LAB — RAD ONC ARIA SESSION SUMMARY
Course Elapsed Days: 23
Plan Fractions Treated to Date: 18
Plan Prescribed Dose Per Fraction: 2 Gy
Plan Total Fractions Prescribed: 35
Plan Total Prescribed Dose: 70 Gy
Reference Point Dosage Given to Date: 36 Gy
Reference Point Session Dosage Given: 2 Gy
Session Number: 18

## 2023-03-30 LAB — CULTURE, GROUP A STREP
MICRO NUMBER:: 15387699
SPECIMEN QUALITY:: ADEQUATE

## 2023-03-31 ENCOUNTER — Ambulatory Visit
Admission: RE | Admit: 2023-03-31 | Discharge: 2023-03-31 | Disposition: A | Discharge status: Home / Self Care | Payer: BC Managed Care – PPO | Source: Ambulatory Visit | Attending: Radiation Oncology | Admitting: Radiation Oncology

## 2023-03-31 ENCOUNTER — Other Ambulatory Visit: Payer: Self-pay

## 2023-03-31 DIAGNOSIS — C61 Malignant neoplasm of prostate: Secondary | ICD-10-CM | POA: Diagnosis not present

## 2023-03-31 DIAGNOSIS — Z191 Hormone sensitive malignancy status: Secondary | ICD-10-CM | POA: Diagnosis not present

## 2023-03-31 DIAGNOSIS — Z510 Encounter for antineoplastic radiation therapy: Secondary | ICD-10-CM | POA: Diagnosis not present

## 2023-03-31 LAB — RAD ONC ARIA SESSION SUMMARY
Course Elapsed Days: 24
Plan Fractions Treated to Date: 19
Plan Prescribed Dose Per Fraction: 2 Gy
Plan Total Fractions Prescribed: 35
Plan Total Prescribed Dose: 70 Gy
Reference Point Dosage Given to Date: 38 Gy
Reference Point Session Dosage Given: 2 Gy
Session Number: 19

## 2023-04-04 ENCOUNTER — Ambulatory Visit
Admission: RE | Admit: 2023-04-04 | Discharge: 2023-04-04 | Disposition: A | Discharge status: Home / Self Care | Payer: BC Managed Care – PPO | Source: Ambulatory Visit | Attending: Radiation Oncology | Admitting: Radiation Oncology

## 2023-04-04 ENCOUNTER — Other Ambulatory Visit: Payer: Self-pay

## 2023-04-04 DIAGNOSIS — C61 Malignant neoplasm of prostate: Secondary | ICD-10-CM | POA: Diagnosis not present

## 2023-04-04 DIAGNOSIS — Z510 Encounter for antineoplastic radiation therapy: Secondary | ICD-10-CM | POA: Diagnosis not present

## 2023-04-04 DIAGNOSIS — Z191 Hormone sensitive malignancy status: Secondary | ICD-10-CM | POA: Diagnosis not present

## 2023-04-04 LAB — RAD ONC ARIA SESSION SUMMARY
Course Elapsed Days: 28
Plan Fractions Treated to Date: 20
Plan Prescribed Dose Per Fraction: 2 Gy
Plan Total Fractions Prescribed: 35
Plan Total Prescribed Dose: 70 Gy
Reference Point Dosage Given to Date: 40 Gy
Reference Point Session Dosage Given: 2 Gy
Session Number: 20

## 2023-04-05 ENCOUNTER — Other Ambulatory Visit: Payer: Self-pay

## 2023-04-05 ENCOUNTER — Ambulatory Visit
Admission: RE | Admit: 2023-04-05 | Discharge: 2023-04-05 | Disposition: A | Discharge status: Home / Self Care | Payer: BC Managed Care – PPO | Source: Ambulatory Visit | Attending: Radiation Oncology | Admitting: Radiation Oncology

## 2023-04-05 DIAGNOSIS — C61 Malignant neoplasm of prostate: Secondary | ICD-10-CM | POA: Diagnosis not present

## 2023-04-05 DIAGNOSIS — Z191 Hormone sensitive malignancy status: Secondary | ICD-10-CM | POA: Diagnosis not present

## 2023-04-05 DIAGNOSIS — Z510 Encounter for antineoplastic radiation therapy: Secondary | ICD-10-CM | POA: Diagnosis not present

## 2023-04-05 LAB — RAD ONC ARIA SESSION SUMMARY
Course Elapsed Days: 29
Plan Fractions Treated to Date: 21
Plan Prescribed Dose Per Fraction: 2 Gy
Plan Total Fractions Prescribed: 35
Plan Total Prescribed Dose: 70 Gy
Reference Point Dosage Given to Date: 42 Gy
Reference Point Session Dosage Given: 2 Gy
Session Number: 21

## 2023-04-06 ENCOUNTER — Other Ambulatory Visit: Payer: Self-pay

## 2023-04-06 ENCOUNTER — Ambulatory Visit
Admission: RE | Admit: 2023-04-06 | Discharge: 2023-04-06 | Disposition: A | Discharge status: Home / Self Care | Payer: BC Managed Care – PPO | Source: Ambulatory Visit | Attending: Radiation Oncology | Admitting: Radiation Oncology

## 2023-04-06 DIAGNOSIS — Z510 Encounter for antineoplastic radiation therapy: Secondary | ICD-10-CM | POA: Diagnosis not present

## 2023-04-06 DIAGNOSIS — C61 Malignant neoplasm of prostate: Secondary | ICD-10-CM | POA: Diagnosis not present

## 2023-04-06 DIAGNOSIS — Z191 Hormone sensitive malignancy status: Secondary | ICD-10-CM | POA: Diagnosis not present

## 2023-04-06 LAB — RAD ONC ARIA SESSION SUMMARY
Course Elapsed Days: 30
Plan Fractions Treated to Date: 22
Plan Prescribed Dose Per Fraction: 2 Gy
Plan Total Fractions Prescribed: 35
Plan Total Prescribed Dose: 70 Gy
Reference Point Dosage Given to Date: 44 Gy
Reference Point Session Dosage Given: 2 Gy
Session Number: 22

## 2023-04-07 ENCOUNTER — Other Ambulatory Visit: Payer: Self-pay

## 2023-04-07 ENCOUNTER — Ambulatory Visit
Admission: RE | Admit: 2023-04-07 | Discharge: 2023-04-07 | Disposition: A | Discharge status: Home / Self Care | Payer: BC Managed Care – PPO | Source: Ambulatory Visit | Attending: Radiation Oncology | Admitting: Radiation Oncology

## 2023-04-07 DIAGNOSIS — C61 Malignant neoplasm of prostate: Secondary | ICD-10-CM | POA: Diagnosis not present

## 2023-04-07 DIAGNOSIS — Z191 Hormone sensitive malignancy status: Secondary | ICD-10-CM | POA: Diagnosis not present

## 2023-04-07 DIAGNOSIS — Z510 Encounter for antineoplastic radiation therapy: Secondary | ICD-10-CM | POA: Diagnosis not present

## 2023-04-07 LAB — RAD ONC ARIA SESSION SUMMARY
Course Elapsed Days: 31
Plan Fractions Treated to Date: 23
Plan Prescribed Dose Per Fraction: 2 Gy
Plan Total Fractions Prescribed: 35
Plan Total Prescribed Dose: 70 Gy
Reference Point Dosage Given to Date: 46 Gy
Reference Point Session Dosage Given: 2 Gy
Session Number: 23

## 2023-04-10 ENCOUNTER — Ambulatory Visit
Admission: RE | Admit: 2023-04-10 | Discharge: 2023-04-10 | Disposition: A | Discharge status: Home / Self Care | Payer: BC Managed Care – PPO | Source: Ambulatory Visit | Attending: Radiation Oncology | Admitting: Radiation Oncology

## 2023-04-10 ENCOUNTER — Inpatient Hospital Stay: Payer: BC Managed Care – PPO

## 2023-04-10 ENCOUNTER — Other Ambulatory Visit: Payer: Self-pay

## 2023-04-10 DIAGNOSIS — C61 Malignant neoplasm of prostate: Secondary | ICD-10-CM | POA: Insufficient documentation

## 2023-04-10 DIAGNOSIS — Z191 Hormone sensitive malignancy status: Secondary | ICD-10-CM | POA: Diagnosis not present

## 2023-04-10 DIAGNOSIS — Z510 Encounter for antineoplastic radiation therapy: Secondary | ICD-10-CM | POA: Diagnosis not present

## 2023-04-10 LAB — RAD ONC ARIA SESSION SUMMARY
Course Elapsed Days: 34
Plan Fractions Treated to Date: 24
Plan Prescribed Dose Per Fraction: 2 Gy
Plan Total Fractions Prescribed: 35
Plan Total Prescribed Dose: 70 Gy
Reference Point Dosage Given to Date: 48 Gy
Reference Point Session Dosage Given: 2 Gy
Session Number: 24

## 2023-04-10 LAB — CBC (CANCER CENTER ONLY)
HCT: 41.5 % (ref 39.0–52.0)
Hemoglobin: 13.6 g/dL (ref 13.0–17.0)
MCH: 28.2 pg (ref 26.0–34.0)
MCHC: 32.8 g/dL (ref 30.0–36.0)
MCV: 86.1 fL (ref 80.0–100.0)
Platelet Count: 213 10*3/uL (ref 150–400)
RBC: 4.82 MIL/uL (ref 4.22–5.81)
RDW: 13.8 % (ref 11.5–15.5)
WBC Count: 6.2 10*3/uL (ref 4.0–10.5)
nRBC: 0 % (ref 0.0–0.2)

## 2023-04-11 ENCOUNTER — Other Ambulatory Visit: Payer: Self-pay

## 2023-04-11 ENCOUNTER — Ambulatory Visit
Admission: RE | Admit: 2023-04-11 | Discharge: 2023-04-11 | Disposition: A | Discharge status: Home / Self Care | Payer: BC Managed Care – PPO | Source: Ambulatory Visit | Attending: Radiation Oncology | Admitting: Radiation Oncology

## 2023-04-11 DIAGNOSIS — Z510 Encounter for antineoplastic radiation therapy: Secondary | ICD-10-CM | POA: Diagnosis not present

## 2023-04-11 DIAGNOSIS — Z191 Hormone sensitive malignancy status: Secondary | ICD-10-CM | POA: Diagnosis not present

## 2023-04-11 DIAGNOSIS — C61 Malignant neoplasm of prostate: Secondary | ICD-10-CM | POA: Diagnosis not present

## 2023-04-11 LAB — RAD ONC ARIA SESSION SUMMARY
Course Elapsed Days: 35
Plan Fractions Treated to Date: 25
Plan Prescribed Dose Per Fraction: 2 Gy
Plan Total Fractions Prescribed: 35
Plan Total Prescribed Dose: 70 Gy
Reference Point Dosage Given to Date: 50 Gy
Reference Point Session Dosage Given: 2 Gy
Session Number: 25

## 2023-04-12 ENCOUNTER — Ambulatory Visit
Admission: RE | Admit: 2023-04-12 | Discharge: 2023-04-12 | Disposition: A | Discharge status: Home / Self Care | Payer: BC Managed Care – PPO | Source: Ambulatory Visit | Attending: Radiation Oncology | Admitting: Radiation Oncology

## 2023-04-12 ENCOUNTER — Other Ambulatory Visit: Payer: Self-pay

## 2023-04-12 DIAGNOSIS — Z510 Encounter for antineoplastic radiation therapy: Secondary | ICD-10-CM | POA: Diagnosis not present

## 2023-04-12 DIAGNOSIS — C61 Malignant neoplasm of prostate: Secondary | ICD-10-CM | POA: Diagnosis not present

## 2023-04-12 DIAGNOSIS — Z191 Hormone sensitive malignancy status: Secondary | ICD-10-CM | POA: Diagnosis not present

## 2023-04-12 LAB — RAD ONC ARIA SESSION SUMMARY
Course Elapsed Days: 36
Plan Fractions Treated to Date: 26
Plan Prescribed Dose Per Fraction: 2 Gy
Plan Total Fractions Prescribed: 35
Plan Total Prescribed Dose: 70 Gy
Reference Point Dosage Given to Date: 52 Gy
Reference Point Session Dosage Given: 2 Gy
Session Number: 26

## 2023-04-13 ENCOUNTER — Other Ambulatory Visit: Payer: Self-pay

## 2023-04-13 ENCOUNTER — Ambulatory Visit
Admission: RE | Admit: 2023-04-13 | Discharge: 2023-04-13 | Disposition: A | Discharge status: Home / Self Care | Payer: BC Managed Care – PPO | Source: Ambulatory Visit | Attending: Radiation Oncology | Admitting: Radiation Oncology

## 2023-04-13 DIAGNOSIS — Z510 Encounter for antineoplastic radiation therapy: Secondary | ICD-10-CM | POA: Diagnosis not present

## 2023-04-13 DIAGNOSIS — C61 Malignant neoplasm of prostate: Secondary | ICD-10-CM | POA: Diagnosis not present

## 2023-04-13 DIAGNOSIS — Z191 Hormone sensitive malignancy status: Secondary | ICD-10-CM | POA: Diagnosis not present

## 2023-04-13 DIAGNOSIS — H903 Sensorineural hearing loss, bilateral: Secondary | ICD-10-CM | POA: Diagnosis not present

## 2023-04-13 LAB — RAD ONC ARIA SESSION SUMMARY
Course Elapsed Days: 37
Plan Fractions Treated to Date: 27
Plan Prescribed Dose Per Fraction: 2 Gy
Plan Total Fractions Prescribed: 35
Plan Total Prescribed Dose: 70 Gy
Reference Point Dosage Given to Date: 54 Gy
Reference Point Session Dosage Given: 2 Gy
Session Number: 27

## 2023-04-14 ENCOUNTER — Ambulatory Visit
Admission: RE | Admit: 2023-04-14 | Discharge: 2023-04-14 | Disposition: A | Discharge status: Home / Self Care | Payer: BC Managed Care – PPO | Source: Ambulatory Visit | Attending: Radiation Oncology | Admitting: Radiation Oncology

## 2023-04-14 ENCOUNTER — Other Ambulatory Visit: Payer: Self-pay

## 2023-04-14 DIAGNOSIS — Z191 Hormone sensitive malignancy status: Secondary | ICD-10-CM | POA: Diagnosis not present

## 2023-04-14 DIAGNOSIS — C61 Malignant neoplasm of prostate: Secondary | ICD-10-CM | POA: Diagnosis not present

## 2023-04-14 DIAGNOSIS — Z510 Encounter for antineoplastic radiation therapy: Secondary | ICD-10-CM | POA: Diagnosis not present

## 2023-04-14 LAB — RAD ONC ARIA SESSION SUMMARY
Course Elapsed Days: 38
Plan Fractions Treated to Date: 28
Plan Prescribed Dose Per Fraction: 2 Gy
Plan Total Fractions Prescribed: 35
Plan Total Prescribed Dose: 70 Gy
Reference Point Dosage Given to Date: 56 Gy
Reference Point Session Dosage Given: 2 Gy
Session Number: 28

## 2023-04-15 ENCOUNTER — Encounter: Payer: Self-pay | Admitting: Family Medicine

## 2023-04-15 DIAGNOSIS — J0110 Acute frontal sinusitis, unspecified: Secondary | ICD-10-CM | POA: Insufficient documentation

## 2023-04-15 DIAGNOSIS — J029 Acute pharyngitis, unspecified: Secondary | ICD-10-CM | POA: Insufficient documentation

## 2023-04-15 NOTE — Assessment & Plan Note (Signed)
Symptoms x 3 weeks Afebrile.  Nasal congestion, sinus pressure and swollen turbinates on exam. Start Prednisone 20 mg daily x 5 days Flonase nasal spray daily If develops fever in 1-2 days patient to notify MD and can call in script for Atx Patient agreeable to plan

## 2023-04-15 NOTE — Assessment & Plan Note (Addendum)
No acute respiratory distress.  Lungs clear on exam.  Suspect PND, viral URI COVID negative Trial Tessalon Pearls as needed Flonase nasal spray daily Humidified air Increase water intake If no improvement follow up with PCP

## 2023-04-15 NOTE — Assessment & Plan Note (Signed)
Mild posterior pharyngeal erythema without exudate.  No cervical lymphadenopathy and afebrile. Rapid strep negative Throat culture sent for confirmation Increase water intake Honey tea for sore throat Cepacol lozenges as needed Follow up with culture results, if positive will treat

## 2023-04-17 ENCOUNTER — Ambulatory Visit
Admission: RE | Admit: 2023-04-17 | Discharge: 2023-04-17 | Disposition: A | Discharge status: Home / Self Care | Payer: BC Managed Care – PPO | Source: Ambulatory Visit | Attending: Radiation Oncology | Admitting: Radiation Oncology

## 2023-04-17 ENCOUNTER — Other Ambulatory Visit: Payer: Self-pay

## 2023-04-17 DIAGNOSIS — C61 Malignant neoplasm of prostate: Secondary | ICD-10-CM | POA: Diagnosis not present

## 2023-04-17 DIAGNOSIS — Z510 Encounter for antineoplastic radiation therapy: Secondary | ICD-10-CM | POA: Diagnosis not present

## 2023-04-17 DIAGNOSIS — Z191 Hormone sensitive malignancy status: Secondary | ICD-10-CM | POA: Diagnosis not present

## 2023-04-17 LAB — RAD ONC ARIA SESSION SUMMARY
Course Elapsed Days: 41
Plan Fractions Treated to Date: 29
Plan Prescribed Dose Per Fraction: 2 Gy
Plan Total Fractions Prescribed: 35
Plan Total Prescribed Dose: 70 Gy
Reference Point Dosage Given to Date: 58 Gy
Reference Point Session Dosage Given: 2 Gy
Session Number: 29

## 2023-04-18 ENCOUNTER — Other Ambulatory Visit: Payer: Self-pay

## 2023-04-18 ENCOUNTER — Ambulatory Visit
Admission: RE | Admit: 2023-04-18 | Discharge: 2023-04-18 | Disposition: A | Discharge status: Home / Self Care | Payer: BC Managed Care – PPO | Source: Ambulatory Visit | Attending: Radiation Oncology | Admitting: Radiation Oncology

## 2023-04-18 DIAGNOSIS — C61 Malignant neoplasm of prostate: Secondary | ICD-10-CM | POA: Diagnosis not present

## 2023-04-18 DIAGNOSIS — Z510 Encounter for antineoplastic radiation therapy: Secondary | ICD-10-CM | POA: Diagnosis not present

## 2023-04-18 DIAGNOSIS — Z191 Hormone sensitive malignancy status: Secondary | ICD-10-CM | POA: Diagnosis not present

## 2023-04-18 LAB — RAD ONC ARIA SESSION SUMMARY
Course Elapsed Days: 42
Plan Fractions Treated to Date: 30
Plan Prescribed Dose Per Fraction: 2 Gy
Plan Total Fractions Prescribed: 35
Plan Total Prescribed Dose: 70 Gy
Reference Point Dosage Given to Date: 60 Gy
Reference Point Session Dosage Given: 2 Gy
Session Number: 30

## 2023-04-19 ENCOUNTER — Ambulatory Visit
Admission: RE | Admit: 2023-04-19 | Discharge: 2023-04-19 | Disposition: A | Discharge status: Home / Self Care | Payer: BC Managed Care – PPO | Source: Ambulatory Visit | Attending: Radiation Oncology | Admitting: Radiation Oncology

## 2023-04-19 ENCOUNTER — Other Ambulatory Visit: Payer: Self-pay

## 2023-04-19 DIAGNOSIS — Z191 Hormone sensitive malignancy status: Secondary | ICD-10-CM | POA: Diagnosis not present

## 2023-04-19 DIAGNOSIS — Z510 Encounter for antineoplastic radiation therapy: Secondary | ICD-10-CM | POA: Diagnosis not present

## 2023-04-19 DIAGNOSIS — C61 Malignant neoplasm of prostate: Secondary | ICD-10-CM | POA: Diagnosis not present

## 2023-04-19 LAB — RAD ONC ARIA SESSION SUMMARY
Course Elapsed Days: 43
Plan Fractions Treated to Date: 31
Plan Prescribed Dose Per Fraction: 2 Gy
Plan Total Fractions Prescribed: 35
Plan Total Prescribed Dose: 70 Gy
Reference Point Dosage Given to Date: 62 Gy
Reference Point Session Dosage Given: 2 Gy
Session Number: 31

## 2023-04-20 ENCOUNTER — Other Ambulatory Visit: Payer: Self-pay

## 2023-04-20 ENCOUNTER — Ambulatory Visit
Admission: RE | Admit: 2023-04-20 | Discharge: 2023-04-20 | Disposition: A | Discharge status: Home / Self Care | Payer: BC Managed Care – PPO | Source: Ambulatory Visit | Attending: Radiation Oncology | Admitting: Radiation Oncology

## 2023-04-20 DIAGNOSIS — Z191 Hormone sensitive malignancy status: Secondary | ICD-10-CM | POA: Diagnosis not present

## 2023-04-20 DIAGNOSIS — Z510 Encounter for antineoplastic radiation therapy: Secondary | ICD-10-CM | POA: Diagnosis not present

## 2023-04-20 DIAGNOSIS — C61 Malignant neoplasm of prostate: Secondary | ICD-10-CM | POA: Diagnosis not present

## 2023-04-20 LAB — RAD ONC ARIA SESSION SUMMARY
Course Elapsed Days: 44
Plan Fractions Treated to Date: 32
Plan Prescribed Dose Per Fraction: 2 Gy
Plan Total Fractions Prescribed: 35
Plan Total Prescribed Dose: 70 Gy
Reference Point Dosage Given to Date: 64 Gy
Reference Point Session Dosage Given: 2 Gy
Session Number: 32

## 2023-04-21 ENCOUNTER — Other Ambulatory Visit: Payer: Self-pay

## 2023-04-21 ENCOUNTER — Ambulatory Visit
Admission: RE | Admit: 2023-04-21 | Discharge: 2023-04-21 | Disposition: A | Discharge status: Home / Self Care | Payer: BC Managed Care – PPO | Source: Ambulatory Visit | Attending: Radiation Oncology | Admitting: Radiation Oncology

## 2023-04-21 DIAGNOSIS — Z510 Encounter for antineoplastic radiation therapy: Secondary | ICD-10-CM | POA: Diagnosis not present

## 2023-04-21 DIAGNOSIS — C61 Malignant neoplasm of prostate: Secondary | ICD-10-CM | POA: Diagnosis not present

## 2023-04-21 DIAGNOSIS — Z191 Hormone sensitive malignancy status: Secondary | ICD-10-CM | POA: Diagnosis not present

## 2023-04-21 LAB — RAD ONC ARIA SESSION SUMMARY
Course Elapsed Days: 45
Plan Fractions Treated to Date: 33
Plan Prescribed Dose Per Fraction: 2 Gy
Plan Total Fractions Prescribed: 35
Plan Total Prescribed Dose: 70 Gy
Reference Point Dosage Given to Date: 66 Gy
Reference Point Session Dosage Given: 2 Gy
Session Number: 33

## 2023-04-24 ENCOUNTER — Ambulatory Visit (INDEPENDENT_AMBULATORY_CARE_PROVIDER_SITE_OTHER): Payer: BC Managed Care – PPO | Admitting: Physician Assistant

## 2023-04-24 ENCOUNTER — Inpatient Hospital Stay: Payer: BC Managed Care – PPO

## 2023-04-24 ENCOUNTER — Encounter: Payer: Self-pay | Admitting: Physician Assistant

## 2023-04-24 ENCOUNTER — Ambulatory Visit: Payer: BC Managed Care – PPO

## 2023-04-24 ENCOUNTER — Ambulatory Visit: Admission: RE | Admit: 2023-04-24 | Payer: BC Managed Care – PPO | Source: Ambulatory Visit

## 2023-04-24 VITALS — BP 134/85 | HR 97 | Ht 68.0 in | Wt 218.0 lb

## 2023-04-24 DIAGNOSIS — R3916 Straining to void: Secondary | ICD-10-CM | POA: Diagnosis not present

## 2023-04-24 DIAGNOSIS — Z510 Encounter for antineoplastic radiation therapy: Secondary | ICD-10-CM | POA: Diagnosis not present

## 2023-04-24 DIAGNOSIS — C61 Malignant neoplasm of prostate: Secondary | ICD-10-CM | POA: Diagnosis not present

## 2023-04-24 LAB — MICROSCOPIC EXAMINATION

## 2023-04-24 LAB — URINALYSIS, COMPLETE
Bilirubin, UA: NEGATIVE
Glucose, UA: NEGATIVE
Ketones, UA: NEGATIVE
Leukocytes,UA: NEGATIVE
Nitrite, UA: NEGATIVE
Protein,UA: NEGATIVE
RBC, UA: NEGATIVE
Specific Gravity, UA: 1.02 (ref 1.005–1.030)
Urobilinogen, Ur: 0.2 mg/dL (ref 0.2–1.0)
pH, UA: 5.5 (ref 5.0–7.5)

## 2023-04-24 LAB — CBC (CANCER CENTER ONLY)
HCT: 41.2 % (ref 39.0–52.0)
Hemoglobin: 13.7 g/dL (ref 13.0–17.0)
MCH: 28.4 pg (ref 26.0–34.0)
MCHC: 33.3 g/dL (ref 30.0–36.0)
MCV: 85.5 fL (ref 80.0–100.0)
Platelet Count: 232 10*3/uL (ref 150–400)
RBC: 4.82 MIL/uL (ref 4.22–5.81)
RDW: 14.3 % (ref 11.5–15.5)
WBC Count: 4.5 10*3/uL (ref 4.0–10.5)
nRBC: 0 % (ref 0.0–0.2)

## 2023-04-24 LAB — BLADDER SCAN AMB NON-IMAGING: Scan Result: 176

## 2023-04-24 MED ORDER — TAMSULOSIN HCL 0.4 MG PO CAPS
0.4000 mg | ORAL_CAPSULE | Freq: Every day | ORAL | 1 refills | Status: DC
Start: 2023-04-24 — End: 2023-05-25

## 2023-04-24 NOTE — Progress Notes (Signed)
04/24/2023 9:40 AM   Tyler Peters 1961-06-05 [id number]  CC: Chief Complaint  Patient presents with   Follow-up   HPI: Tyler Peters is a 62 y.o. male with PMH high risk prostate cancer s/p radical prostatectomy in 2014 with detectable postop PSA currently undergoing salvage radiation and on ADT who presents today for evaluation of urinary straining.   Today he reports sudden onset urinary hesitancy and straining 2 nights ago without dysuria.  His symptoms vary throughout the day.  He has to radiation treatments remaining.  PVR 176 mL.  PMH: Past Medical History:  Diagnosis Date   Cancer [facility])    prostate   Complication of anesthesia    slow awake   Episodic recurrent vertigo    evaluated by Dr. Jenne Campus with normal MRI   Episodic recurrent vertigo    normal MRI    GERD (gastroesophageal reflux disease)    History of acute prostatitis Fall 2008   History of kidney stones    Hyperlipidemia, mixed    Hypertension    Sleep apnea    Testicular pain 2008   prostatis, treated with Flomax by Dr. Achilles Dunk    Surgical History: Past Surgical History:  Procedure Laterality Date   COLONOSCOPY WITH PROPOFOL N/A 01/17/2018   Procedure: COLONOSCOPY WITH PROPOFOL;  Surgeon: Earline Mayotte, MD;  Location: [facility] ENDOSCOPY;  Service: Endoscopy;  Laterality: N/A;   LITHOTRIPSY     NASAL SEPTUM SURGERY  2004   PELVIC LYMPH NODE DISSECTION Bilateral 12/12/2022   Procedure: PELVIC LYMPH NODE DISSECTION;  Surgeon: Sondra Come, MD;  Location: [facility] ORS;  Service: Urology;  Laterality: Bilateral;   ROBOT ASSISTED LAPAROSCOPIC RADICAL PROSTATECTOMY N/A 12/12/2022   Procedure: XI ROBOTIC ASSISTED LAPAROSCOPIC RADICAL PROSTATECTOMY;  Surgeon: Sondra Come, MD;  Location: [facility] ORS;  Service: Urology;  Laterality: N/A;    Home Medications:  Allergies as of 04/24/2023       Reactions   Tramadol Hcl Nausea Only        Medication List        Accurate as of April 24, 2023   9:40 AM. If you have any questions, ask your nurse or doctor.          STOP taking these medications    benzonatate 200 MG capsule Commonly known as: TESSALON Stopped by: Carman Ching       TAKE these medications    acetaminophen 500 MG tablet Commonly known as: TYLENOL Take 1,000 mg by mouth every 6 (six) hours as needed.   Calcium-Vitamin D 600-5 MG-MCG Tabs Take 1,200 mg by mouth daily.   famotidine 20 MG tablet Commonly known as: PEPCID Take 1 tablet (20 mg total) by mouth 2 (two) times daily. What changed: when to take this   fluticasone 50 MCG/ACT nasal spray Commonly known as: FLONASE Place 2 sprays into both nostrils daily.   hydrochlorothiazide 25 MG tablet Commonly known as: HYDRODIURIL Take 1 tablet (25 mg total) by mouth daily.   multivitamin with minerals tablet Take 1 tablet by mouth daily.   RED YEAST RICE PO Take 1 tablet by mouth daily.        Allergies:  Allergies  Allergen Reactions   Tramadol Hcl Nausea Only    Family History: Family History  Problem Relation Age of Onset   Heart disease Father    Heart disease Maternal Grandmother    Diabetes Maternal Grandmother     Social History:   reports that he has never  smoked. He has never been exposed to tobacco smoke. He has never used smokeless tobacco. He reports that he does not drink alcohol and does not use drugs.  Physical Exam: BP 134/85   Pulse 97   Ht 5\' 8"  (1.727 m)   Wt 218 lb (98.9 kg)   BMI 33.15 kg/m   Constitutional:  Alert and oriented, no acute distress, nontoxic appearing HEENT: [place], AT Cardiovascular: No clubbing, cyanosis, or edema Respiratory: Normal respiratory effort, no increased work of breathing Skin: No rashes, bruises or suspicious lesions Neurologic: Grossly intact, no focal deficits, moving all 4 extremities Psychiatric: Normal mood and affect  Laboratory Data: Results for orders placed or performed in visit on 04/24/23  Microscopic  Examination   Urine  Result Value Ref Range   WBC, UA 0-5 0 - 5 /hpf   RBC, Urine 0-2 0 - 2 /hpf   Epithelial Cells (non renal) 0-10 0 - 10 /hpf   Bacteria, UA Few None seen/Few  Urinalysis, Complete  Result Value Ref Range   Specific Gravity, UA 1.020 1.005 - 1.030   pH, UA 5.5 5.0 - 7.5   Color, UA Yellow Yellow   Appearance Ur Clear Clear   Leukocytes,UA Negative Negative   Protein,UA Negative Negative/Trace   Glucose, UA Negative Negative   Ketones, UA Negative Negative   RBC, UA Negative Negative   Bilirubin, UA Negative Negative   Urobilinogen, Ur 0.2 0.2 - 1.0 mg/dL   Nitrite, UA Negative Negative   Microscopic Examination See below:   BLADDER SCAN AMB NON-IMAGING  Result Value Ref Range   Scan Result 176 ml    Assessment & Plan:   1. Straining to void New obstructive urinary symptoms while undergoing salvage radiation for high risk prostate cancer s/p prostatectomy.  PVR is elevated, though he is not in overt retention.    We discussed that his symptoms are likely due to tissue edema at the radiation site and that I anticipate these will resolve within about 2 weeks of completing radiation.    Despite prostatectomy, I still think he would benefit from tamsulosin 1-2 times daily to help open up his bladder neck.  I also offered him CIC teaching, but we elected to defer this unless the tamsulosin does not help.  He will reach out to me via MyChart tomorrow if he wants to try CIC teaching.  Low suspicion for UTI at this time, though he was able to give me a urine sample. - BLADDER SCAN AMB NON-IMAGING - Urinalysis, Complete - tamsulosin (FLOMAX) 0.4 MG CAPS capsule; Take 1-2 capsules (0.4-0.8 mg total) by mouth daily.  Dispense: 60 capsule; Refill: 1   Return if symptoms worsen or fail to improve.  Carman Ching, PA-C  [facility] [place] [address] [place], [place] [address] [phone]

## 2023-04-25 ENCOUNTER — Ambulatory Visit
Admission: RE | Admit: 2023-04-25 | Discharge: 2023-04-25 | Disposition: A | Discharge status: Home / Self Care | Payer: BC Managed Care – PPO | Source: Ambulatory Visit | Attending: Radiation Oncology | Admitting: Radiation Oncology

## 2023-04-25 ENCOUNTER — Ambulatory Visit: Payer: BC Managed Care – PPO | Admitting: Physician Assistant

## 2023-04-25 ENCOUNTER — Ambulatory Visit: Payer: BC Managed Care – PPO

## 2023-04-25 ENCOUNTER — Other Ambulatory Visit: Payer: Self-pay

## 2023-04-25 DIAGNOSIS — Z510 Encounter for antineoplastic radiation therapy: Secondary | ICD-10-CM | POA: Diagnosis not present

## 2023-04-25 DIAGNOSIS — R3916 Straining to void: Secondary | ICD-10-CM | POA: Diagnosis not present

## 2023-04-25 DIAGNOSIS — C61 Malignant neoplasm of prostate: Secondary | ICD-10-CM | POA: Diagnosis not present

## 2023-04-25 DIAGNOSIS — R339 Retention of urine, unspecified: Secondary | ICD-10-CM | POA: Diagnosis not present

## 2023-04-25 DIAGNOSIS — Z191 Hormone sensitive malignancy status: Secondary | ICD-10-CM | POA: Diagnosis not present

## 2023-04-25 LAB — RAD ONC ARIA SESSION SUMMARY
Course Elapsed Days: 49
Plan Fractions Treated to Date: 34
Plan Prescribed Dose Per Fraction: 2 Gy
Plan Total Fractions Prescribed: 35
Plan Total Prescribed Dose: 70 Gy
Reference Point Dosage Given to Date: 68 Gy
Reference Point Session Dosage Given: 2 Gy
Session Number: 34

## 2023-04-25 NOTE — Telephone Encounter (Signed)
Pt was scheduled earlier today at 1:45 pm

## 2023-04-25 NOTE — Progress Notes (Unsigned)
In and Out Catheterization  Patient is present today for a I & O catheterization due to urinary retention. Patient was cleaned and prepped in a sterile fashion with betadine . A 14FR cath was inserted no complications were noted , 400 ml of urine return was noted, urine was clear, yellow  in color. A clean urine sample was collected for urinary retention . Bladder was drained  And catheter was removed with out difficulty.    Performed by: Randa Lynn, RMA

## 2023-04-26 ENCOUNTER — Ambulatory Visit
Admission: RE | Admit: 2023-04-26 | Discharge: 2023-04-26 | Disposition: A | Discharge status: Home / Self Care | Payer: BC Managed Care – PPO | Source: Ambulatory Visit | Attending: Radiation Oncology | Admitting: Radiation Oncology

## 2023-04-26 ENCOUNTER — Ambulatory Visit: Payer: BC Managed Care – PPO | Admitting: Internal Medicine

## 2023-04-26 ENCOUNTER — Encounter: Payer: Self-pay | Admitting: Internal Medicine

## 2023-04-26 ENCOUNTER — Other Ambulatory Visit: Payer: Self-pay

## 2023-04-26 VITALS — BP 110/78 | HR 90 | Temp 97.8°F | Ht 68.0 in | Wt 219.8 lb

## 2023-04-26 DIAGNOSIS — Z1283 Encounter for screening for malignant neoplasm of skin: Secondary | ICD-10-CM

## 2023-04-26 DIAGNOSIS — Z191 Hormone sensitive malignancy status: Secondary | ICD-10-CM | POA: Diagnosis not present

## 2023-04-26 DIAGNOSIS — C61 Malignant neoplasm of prostate: Secondary | ICD-10-CM

## 2023-04-26 DIAGNOSIS — I10 Essential (primary) hypertension: Secondary | ICD-10-CM | POA: Diagnosis not present

## 2023-04-26 DIAGNOSIS — R0683 Snoring: Secondary | ICD-10-CM

## 2023-04-26 DIAGNOSIS — Z23 Encounter for immunization: Secondary | ICD-10-CM

## 2023-04-26 DIAGNOSIS — R0681 Apnea, not elsewhere classified: Secondary | ICD-10-CM

## 2023-04-26 DIAGNOSIS — Z0000 Encounter for general adult medical examination without abnormal findings: Secondary | ICD-10-CM | POA: Diagnosis not present

## 2023-04-26 DIAGNOSIS — Z6835 Body mass index (BMI) 35.0-35.9, adult: Secondary | ICD-10-CM | POA: Diagnosis not present

## 2023-04-26 DIAGNOSIS — R7303 Prediabetes: Secondary | ICD-10-CM

## 2023-04-26 DIAGNOSIS — E782 Mixed hyperlipidemia: Secondary | ICD-10-CM | POA: Diagnosis not present

## 2023-04-26 DIAGNOSIS — E6609 Other obesity due to excess calories: Secondary | ICD-10-CM

## 2023-04-26 DIAGNOSIS — Z510 Encounter for antineoplastic radiation therapy: Secondary | ICD-10-CM | POA: Diagnosis not present

## 2023-04-26 LAB — CBC WITH DIFFERENTIAL/PLATELET
Basophils Absolute: 0 10*3/uL (ref 0.0–0.1)
Basophils Relative: 0.7 % (ref 0.0–3.0)
Eosinophils Absolute: 0.1 10*3/uL (ref 0.0–0.7)
Eosinophils Relative: 2.4 % (ref 0.0–5.0)
HCT: 39.5 % (ref 39.0–52.0)
Hemoglobin: 13.4 g/dL (ref 13.0–17.0)
Lymphocytes Relative: 9.3 % — ABNORMAL LOW (ref 12.0–46.0)
Lymphs Abs: 0.4 10*3/uL — ABNORMAL LOW (ref 0.7–4.0)
MCHC: 34 g/dL (ref 30.0–36.0)
MCV: 84.3 fl (ref 78.0–100.0)
Monocytes Absolute: 0.4 10*3/uL (ref 0.1–1.0)
Monocytes Relative: 9.3 % (ref 3.0–12.0)
Neutro Abs: 3.5 10*3/uL (ref 1.4–7.7)
Neutrophils Relative %: 78.3 % — ABNORMAL HIGH (ref 43.0–77.0)
Platelets: 276 10*3/uL (ref 150.0–400.0)
RBC: 4.69 Mil/uL (ref 4.22–5.81)
RDW: 15.7 % — ABNORMAL HIGH (ref 11.5–15.5)
WBC: 4.5 10*3/uL (ref 4.0–10.5)

## 2023-04-26 LAB — RAD ONC ARIA SESSION SUMMARY
Course Elapsed Days: 50
Plan Fractions Treated to Date: 35
Plan Prescribed Dose Per Fraction: 2 Gy
Plan Total Fractions Prescribed: 35
Plan Total Prescribed Dose: 70 Gy
Reference Point Dosage Given to Date: 70 Gy
Reference Point Session Dosage Given: 2 Gy
Session Number: 35

## 2023-04-26 LAB — COMPREHENSIVE METABOLIC PANEL
ALT: 16 U/L (ref 0–53)
AST: 14 U/L (ref 0–37)
Albumin: 4.2 g/dL (ref 3.5–5.2)
Alkaline Phosphatase: 57 U/L (ref 39–117)
BUN: 14 mg/dL (ref 6–23)
CO2: 29 mEq/L (ref 19–32)
Calcium: 9.7 mg/dL (ref 8.4–10.5)
Chloride: 100 mEq/L (ref 96–112)
Creatinine, Ser: 0.89 mg/dL (ref 0.40–1.50)
GFR: 93.61 mL/min (ref >60.00)
Glucose, Bld: 123 mg/dL — ABNORMAL HIGH (ref 70–99)
Potassium: 3.6 mEq/L (ref 3.5–5.1)
Sodium: 139 mEq/L (ref 135–145)
Total Bilirubin: 0.5 mg/dL (ref 0.2–1.2)
Total Protein: 6.6 g/dL (ref 6.0–8.3)

## 2023-04-26 LAB — LIPID PANEL
Cholesterol: 223 mg/dL — ABNORMAL HIGH (ref 0–200)
HDL: 49.2 mg/dL (ref >39.00)
LDL Cholesterol: 135 mg/dL — ABNORMAL HIGH (ref 0–99)
NonHDL: 173.77
Total CHOL/HDL Ratio: 5
Triglycerides: 194 mg/dL — ABNORMAL HIGH (ref 0.0–149.0)
VLDL: 38.8 mg/dL (ref 0.0–40.0)

## 2023-04-26 LAB — MICROALBUMIN / CREATININE URINE RATIO
Creatinine,U: 87.6 mg/dL
Microalb Creat Ratio: 33.7 mg/g — ABNORMAL HIGH (ref 0.0–30.0)
Microalb, Ur: 29.5 mg/dL — ABNORMAL HIGH (ref 0.0–1.9)

## 2023-04-26 LAB — TSH: TSH: 1.73 u[IU]/mL (ref 0.35–5.50)

## 2023-04-26 LAB — LDL CHOLESTEROL, DIRECT: Direct LDL: 163 mg/dL

## 2023-04-26 LAB — PSA: PSA: 0 ng/mL — ABNORMAL LOW (ref 0.10–4.00)

## 2023-04-26 LAB — HEMOGLOBIN A1C: Hgb A1c MFr Bld: 6.3 % (ref 4.6–6.5)

## 2023-04-26 MED ORDER — HYDROCHLOROTHIAZIDE 25 MG PO TABS
25.0000 mg | ORAL_TABLET | Freq: Every day | ORAL | 1 refills | Status: DC
Start: 2023-04-26 — End: 2023-04-26

## 2023-04-26 MED ORDER — LOSARTAN POTASSIUM 50 MG PO TABS: 50.0000 mg | ORAL_TABLET | Freq: Every day | ORAL | 0 refills | Status: DC

## 2023-04-26 NOTE — Patient Instructions (Signed)
Referrals for Dermatology and sleep study (home test) are in progress

## 2023-04-26 NOTE — Assessment & Plan Note (Signed)
He has finished XRT today.  PSA is now undetectable   Lab Results  Component Value Date   PSA1 0.5 01/19/2023   PSA1 4.7 (H) 08/22/2022   PSA1 3.8 05/25/2022   PSA 0.00 (L) 04/26/2023   PSA 3.63 04/22/2022   PSA 1.43 06/07/2021

## 2023-04-26 NOTE — Assessment & Plan Note (Signed)
Elevated .  Advised to follow a low cholesterol diet and repeat in 6 months   Lab Results  Component Value Date   CHOL 223 (H) 04/26/2023   HDL 49.20 04/26/2023   LDLCALC 135 (H) 04/26/2023   LDLDIRECT 163.0 04/26/2023   TRIG 194.0 (H) 04/26/2023   CHOLHDL 5 04/26/2023

## 2023-04-26 NOTE — Assessment & Plan Note (Signed)
With snoring , obesity and hypertension.  Osa suspected.  Sleep study (home ) ordered

## 2023-04-26 NOTE — Assessment & Plan Note (Signed)
He has lost weight since his now welcomed layoff. . I have congratulated him in reduction of   BMI and encouraged  Continued weight loss with goal of 10% of body weight over the next 6 months using a low glycemic index diet and regular exercise a minimum of 5 days per week.

## 2023-04-26 NOTE — Assessment & Plan Note (Signed)
With proteinuria , new onset .  Changing med to losartan

## 2023-04-26 NOTE — Progress Notes (Signed)
Patient ID: Tyler Peters, male    DOB: October 17, 1960  Age: 62 y.o. MRN: [id number]  The patient is here for annual preventive examination and management of other chronic and acute problems.   The risk factors are reflected in the social history.   The roster of all physicians providing medical care to patient - is listed in the Snapshot section of the chart.   Activities of daily living:  The patient is 100% independent in all ADLs: dressing, toileting, feeding as well as independent mobility   Home safety : The patient has smoke detectors in the home. They wear seatbelts.  There are no unsecured firearms at home. There is no violence in the home.    There is no risks for hepatitis, STDs or HIV. There is no   history of blood transfusion. They have no travel history to infectious disease endemic areas of the world.   The patient has seen their dentist in the last six month. They have seen their eye doctor in the last year. The patinet  denies slight hearing difficulty with regard to whispered voices and some television programs.  They have deferred audiologic testing in the last year.  They do not  have excessive sun exposure. Discussed the need for sun protection: hats, long sleeves and use of sunscreen if there is significant sun exposure.    Diet: the importance of a healthy diet is discussed. They do have a healthy diet.   The benefits of regular aerobic exercise were discussed. The patient  exercises  3 to 5 days per week  for  60 minutes.    Depression screen: there are no signs or vegative symptoms of depression- irritability, change in appetite, anhedonia, sadness/tearfullness.   The following portions of the patient's history were reviewed and updated as appropriate: allergies, current medications, past family history, past medical history,  past surgical history, past social history  and problem list.   Visual acuity was not assessed per patient preference since the patient has regular  follow up with an  ophthalmologist. Hearing and body mass index were assessed and reviewed.    During the course of the visit the patient was educated and counseled about appropriate screening and preventive services including : fall prevention , diabetes screening, nutrition counseling, colorectal cancer screening, and recommended immunizations.    Chief Complaint:  1) Prostate CA:  diagnosed after last CPE .  Aggressive.  S/p radical prostatectomy and XRT . Several months of urinary retention      Review of Symptoms  Patient denies headache, fevers, malaise, unintentional weight loss, skin rash, eye pain, sinus congestion and sinus pain, sore throat, dysphagia,  hemoptysis , cough, dyspnea, wheezing, chest pain, palpitations, orthopnea, edema, abdominal pain, nausea, melena, diarrhea, constipation, flank pain, dysuria, hematuria, urinary  Frequency, nocturia, numbness, tingling, seizures,  Focal weakness, Loss of consciousness,  Tremor, insomnia, depression, anxiety, and suicidal ideation.    Physical Exam:  BP 110/78   Pulse 90   Temp 97.8 F (36.6 C) (Oral)   Ht 5\' 8"  (1.727 m)   Wt 219 lb 12.8 oz (99.7 kg)   SpO2 98%   BMI 33.42 kg/m    Physical Exam Vitals reviewed.  Constitutional:      General: He is not in acute distress.    Appearance: Normal appearance. He is normal weight. He is not ill-appearing, toxic-appearing or diaphoretic.  HENT:     Head: Normocephalic and atraumatic.     Right Ear: Tympanic membrane, ear  canal and external ear normal. There is no impacted cerumen.     Left Ear: Tympanic membrane, ear canal and external ear normal. There is no impacted cerumen.     Nose: Nose normal.     Mouth/Throat:     Mouth: Mucous membranes are moist.     Pharynx: Oropharynx is clear.  Eyes:     General: No scleral icterus.       Right eye: No discharge.        Left eye: No discharge.     Conjunctiva/sclera: Conjunctivae normal.  Neck:     Thyroid: No thyromegaly.      Vascular: No carotid bruit or JVD.  Cardiovascular:     Rate and Rhythm: Normal rate and regular rhythm.     Heart sounds: Normal heart sounds.  Pulmonary:     Effort: Pulmonary effort is normal. No respiratory distress.     Breath sounds: Normal breath sounds.  Abdominal:     General: Bowel sounds are normal.     Palpations: Abdomen is soft. There is no mass.     Tenderness: There is no abdominal tenderness. There is no guarding or rebound.  Musculoskeletal:        General: Normal range of motion.     Cervical back: Normal range of motion and neck supple.  Lymphadenopathy:     Cervical: No cervical adenopathy.  Skin:    General: Skin is warm and dry.  Neurological:     General: No focal deficit present.     Mental Status: He is alert and oriented to person, place, and time. Mental status is at baseline.  Psychiatric:        Mood and Affect: Mood normal.        Behavior: Behavior normal.        Thought Content: Thought content normal.        Judgment: Judgment normal.    Assessment and Plan: Hyperlipidemia, mixed Assessment & Plan: Elevated .  Advised to follow a low cholesterol diet and repeat in 6 months   Lab Results  Component Value Date   CHOL 223 (H) 04/26/2023   HDL 49.20 04/26/2023   LDLCALC 135 (H) 04/26/2023   LDLDIRECT 163.0 04/26/2023   TRIG 194.0 (H) 04/26/2023   CHOLHDL 5 04/26/2023     Orders: -     Lipid panel -     LDL cholesterol, direct  Hypertension, essential Assessment & Plan: With proteinuria , new onset .  Changing med to losartan   Orders: -     Comprehensive metabolic panel -     Microalbumin / creatinine urine ratio -     PSG SLEEP STUDY; Future  Class 2 obesity due to excess calories without serious comorbidity with body mass index (BMI) of 35.0 to 35.9 in adult Assessment & Plan: He has lost weight since his now welcomed layoff. . I have congratulated him in reduction of   BMI and encouraged  Continued weight loss with goal  of 10% of body weight over the next 6 months using a low glycemic index diet and regular exercise a minimum of 5 days per week.    Orders: -     Comprehensive metabolic panel -     CBC with Differential/Platelet -     TSH -     PSG SLEEP STUDY; Future  Prediabetes -     Comprehensive metabolic panel -     Hemoglobin A1c  Prostate cancer [facility]) Assessment & Plan:  He has finished XRT today.  PSA is now undetectable   Lab Results  Component Value Date   PSA1 0.5 01/19/2023   PSA1 4.7 (H) 08/22/2022   PSA1 3.8 05/25/2022   PSA 0.00 (L) 04/26/2023   PSA 3.63 04/22/2022   PSA 1.43 06/07/2021      Orders: -     PSA  Screening exam for skin cancer -     Ambulatory referral to Dermatology  Snoring -     PSG SLEEP STUDY; Future  Witnessed apneic spells Assessment & Plan: With snoring , obesity and hypertension.  Osa suspected.  Sleep study (home ) ordered   Orders: -     PSG SLEEP STUDY; Future  Need for influenza vaccination -     Flu vaccine trivalent PF, 6mos and older(Flulaval,Afluria,Fluarix,Fluzone)  Need for Tdap vaccination -     Tdap vaccine greater than or equal to 7yo IM  Other orders -     Losartan Potassium; Take 1 tablet (50 mg total) by mouth at bedtime.  Dispense: 90 tablet; Refill: 0    Return in about 6 months (around 10/24/2023).  Sherlene Shams, MD

## 2023-04-27 NOTE — Progress Notes (Signed)
Patient presented to today with reports of the inability to void despite starting Flomax overnight.  He was I&O cathed on arrival, see separate procedure note.  He subsequently watched the CIC instructional video and declined to practice in clinic.  I did demonstrate CIC procedure including how to troubleshoot common issues and sent him from clinic today with a bag of catheter supplies.  We discussed that I anticipate his difficulty voiding will be time-limited and should resolve within a couple of weeks of completing radiation therapy.  I am scheduling him to come back and see me in 1 month for symptom recheck.  If he continues to require CIC at that point, we will schedule him for cystoscopy with Dr. Richardo Hanks.

## 2023-05-11 ENCOUNTER — Other Ambulatory Visit: Payer: Self-pay

## 2023-05-11 DIAGNOSIS — R3916 Straining to void: Secondary | ICD-10-CM

## 2023-05-12 ENCOUNTER — Ambulatory Visit: Payer: BC Managed Care – PPO | Admitting: Physician Assistant

## 2023-05-12 ENCOUNTER — Ambulatory Visit
Admission: RE | Admit: 2023-05-12 | Discharge: 2023-05-12 | Disposition: A | Discharge status: Home / Self Care | Payer: BC Managed Care – PPO | Source: Ambulatory Visit | Attending: Physician Assistant | Admitting: Physician Assistant

## 2023-05-12 ENCOUNTER — Ambulatory Visit
Admission: RE | Admit: 2023-05-12 | Discharge: 2023-05-12 | Disposition: A | Discharge status: Home / Self Care | Payer: BC Managed Care – PPO | Attending: Physician Assistant | Admitting: Physician Assistant

## 2023-05-12 ENCOUNTER — Encounter: Payer: Self-pay | Admitting: Physician Assistant

## 2023-05-12 VITALS — BP 124/74 | HR 108 | Ht 68.0 in | Wt 219.0 lb

## 2023-05-12 DIAGNOSIS — R3916 Straining to void: Secondary | ICD-10-CM

## 2023-05-12 DIAGNOSIS — Z87442 Personal history of urinary calculi: Secondary | ICD-10-CM | POA: Diagnosis not present

## 2023-05-12 DIAGNOSIS — N200 Calculus of kidney: Secondary | ICD-10-CM | POA: Diagnosis not present

## 2023-05-12 DIAGNOSIS — I878 Other specified disorders of veins: Secondary | ICD-10-CM | POA: Diagnosis not present

## 2023-05-12 DIAGNOSIS — R109 Unspecified abdominal pain: Secondary | ICD-10-CM

## 2023-05-12 LAB — URINALYSIS, COMPLETE
Bilirubin, UA: NEGATIVE
Glucose, UA: NEGATIVE
Ketones, UA: NEGATIVE
Nitrite, UA: NEGATIVE
Specific Gravity, UA: 1.025 (ref 1.005–1.030)
Urobilinogen, Ur: 0.2 mg/dL (ref 0.2–1.0)
pH, UA: 5.5 (ref 5.0–7.5)

## 2023-05-12 LAB — MICROSCOPIC EXAMINATION: WBC, UA: >30 /HPF — AB (ref 0–5)

## 2023-05-12 MED ORDER — SULFAMETHOXAZOLE-TRIMETHOPRIM 800-160 MG PO TABS
1.0000 | ORAL_TABLET | Freq: Two times a day (BID) | ORAL | 0 refills | Status: AC
Start: 2023-05-12 — End: 2023-05-22

## 2023-05-12 NOTE — Patient Instructions (Signed)
Cystoscopy Cystoscopy is a procedure that is used to help diagnose and sometimes treat conditions that affect the lower urinary tract. The lower urinary tract includes the bladder and the urethra. The urethra is the tube that drains urine from the bladder. Cystoscopy is done using a thin, tube-shaped instrument with a light and camera at the end (cystoscope). The cystoscope may be hard or flexible, depending on the goal of the procedure. The cystoscope is inserted through the urethra, into the bladder. Cystoscopy may be recommended if you have: Urinary tract infections that keep coming back. Blood in the urine (hematuria). An inability to control when you urinate (urinary incontinence) or an overactive bladder. Unusual cells found in a urine sample. A blockage in the urethra, such as a urinary stone. Painful urination. An abnormality in the bladder found during an intravenous pyelogram (IVP) or CT scan. Cystoscopy may also be done to remove a sample of tissue to be examined under a microscope (biopsy). Tell a health care provider about: Any allergies you have. All medicines you are taking, including vitamins, herbs, eye drops, creams, and over-the-counter medicines. Any problems you or family members have had with anesthetic medicines. Any blood disorders you have. Any surgeries you have had. Any medical conditions you have. Whether you are pregnant or may be pregnant. What are the risks? Generally, this is a safe procedure. However, problems may occur, including: Infection. Bleeding. Allergic reactions to medicines. Damage to other structures or organs. What happens before the procedure? Medicines Ask your health care provider about: Changing or stopping your regular medicines. This is especially important if you are taking diabetes medicines or blood thinners. Taking medicines such as aspirin and ibuprofen. These medicines can thin your blood. Do not take these medicines unless your  health care provider tells you to take them. Taking over-the-counter medicines, vitamins, herbs, and supplements. Tests You may have an exam or testing, such as: X-rays of the bladder, urethra, or kidneys. CT scan of the abdomen or pelvis. Urine tests to check for signs of infection. General instructions Follow instructions from your health care provider about eating or drinking restrictions. Ask your health care provider what steps will be taken to help prevent infection. These steps may include: Washing skin with a germ-killing soap. Taking antibiotic medicine. Plan to have a responsible adult take you home from the hospital or clinic. What happens during the procedure?  You will be given one or more of the following: A medicine to help you relax (sedative). A medicine to numb the area (local anesthetic). The area around the opening of your urethra will be cleaned. The cystoscope will be passed through your urethra into your bladder. Germ-free (sterile) fluid will flow through the cystoscope to fill your bladder. The fluid will stretch your bladder so that your health care provider can clearly examine your bladder walls. Your doctor will look at the urethra and bladder. Your doctor may take a biopsy or remove stones. The cystoscope will be removed, and your bladder will be emptied. The procedure may vary among health care providers and hospitals. What can I expect after the procedure? After the procedure, it is common to have: Some soreness or pain in your abdomen and urethra. Urinary symptoms. These include: Mild pain or burning when you urinate. Pain should stop within a few minutes after you urinate. This may last for up to 1 week. A small amount of blood in your urine for several days. Feeling like you need to urinate but producing only  a small amount of urine. Follow these instructions at home: Medicines Take over-the-counter and prescription medicines only as told by your  health care provider. If you were prescribed an antibiotic medicine, take it as told by your health care provider. Do not stop taking the antibiotic even if you start to feel better. General instructions Return to your normal activities as told by your health care provider. Ask your health care provider what activities are safe for you. If you were given a sedative during the procedure, it can affect you for several hours. Do not drive or operate machinery until your health care provider says that it is safe. Watch for any blood in your urine. If the amount of blood in your urine increases, call your health care provider. Follow instructions from your health care provider about eating or drinking restrictions. If a tissue sample was removed for testing (biopsy) during your procedure, it is up to you to get your test results. Ask your health care provider, or the department that is doing the test, when your results will be ready. Drink enough fluid to keep your urine pale yellow. Keep all follow-up visits. This is important. Contact a health care provider if: You have pain that gets worse or does not get better with medicine, especially pain when you urinate. You have trouble urinating. You have more blood in your urine. Get help right away if: You have blood clots in your urine. You have abdominal pain. You have a fever or chills. You are unable to urinate. Summary Cystoscopy is a procedure that is used to help diagnose and sometimes treat conditions that affect the lower urinary tract. Cystoscopy is done using a thin, tube-shaped instrument with a light and camera at the end. After the procedure, it is common to have some soreness or pain in your abdomen and urethra. Watch for any blood in your urine. If the amount of blood in your urine increases, call your health care provider. If you were prescribed an antibiotic medicine, take it as told by your health care provider. Do not stop taking  the antibiotic even if you start to feel better. This information is not intended to replace advice given to you by your health care provider. Make sure you discuss any questions you have with your health care provider. Document Revised: 03/31/2021 Document Reviewed: 02/28/2020 Elsevier Patient Education  2024 [employer].

## 2023-05-15 ENCOUNTER — Encounter: Payer: Self-pay | Admitting: Internal Medicine

## 2023-05-15 LAB — CULTURE, URINE COMPREHENSIVE

## 2023-05-16 ENCOUNTER — Other Ambulatory Visit: Payer: Self-pay | Admitting: Physician Assistant

## 2023-05-16 DIAGNOSIS — R3916 Straining to void: Secondary | ICD-10-CM

## 2023-05-23 DIAGNOSIS — R339 Retention of urine, unspecified: Secondary | ICD-10-CM | POA: Diagnosis not present

## 2023-05-25 ENCOUNTER — Encounter: Payer: Self-pay | Admitting: Physician Assistant

## 2023-05-25 ENCOUNTER — Ambulatory Visit (INDEPENDENT_AMBULATORY_CARE_PROVIDER_SITE_OTHER): Payer: BC Managed Care – PPO | Admitting: Physician Assistant

## 2023-05-25 VITALS — BP 137/88 | HR 96

## 2023-05-25 DIAGNOSIS — R3916 Straining to void: Secondary | ICD-10-CM | POA: Diagnosis not present

## 2023-05-25 DIAGNOSIS — N393 Stress incontinence (female) (male): Secondary | ICD-10-CM | POA: Diagnosis not present

## 2023-05-25 DIAGNOSIS — N201 Calculus of ureter: Secondary | ICD-10-CM | POA: Diagnosis not present

## 2023-05-25 NOTE — Progress Notes (Signed)
05/25/2023 4:28 PM   Tyler Peters Oct 02, 1960 [id number]  CC: Chief Complaint  Patient presents with   Follow-up   HPI: Tyler Peters is a 62 y.o. male with PMH high risk prostate cancer s/p radical prostatectomy in May with detectable postop PSA who recently completed salvage radiation on ADT with a recent history of intermittent urinary retention managed with as needed CIC who presents today for follow-up.   I saw him in clinic most recently 13 days ago with reports of increased frequency of needed CIC as well as left flank pain, vomiting, and chills.  Left flank pain, vomiting, and chills had resolved at the time of his visit.  His UA appeared grossly infected, and I did not see any ureteral stones on KUB, so I started him on empiric Bactrim for UTI.  Urine culture finalized with ampicillin resistant Klebsiella pneumoniae.  Today he reports he spontaneously passed a 4 mm stone several days after his office visit with me.  His symptoms have since resolved.  He is no longer requiring self-catheterization.  He was still noticing some resistance met at the level of the bladder neck with insertion of catheters prior to discontinuing.  He is back to his baseline postop urinary leakage, for which she wears absorbent products and occasionally wears an incontinence clamp.  PMH: Past Medical History:  Diagnosis Date   Cancer [facility])    prostate   Complication of anesthesia    slow awake   Episodic recurrent vertigo    evaluated by Dr. Jenne Campus with normal MRI   Episodic recurrent vertigo    normal MRI    GERD (gastroesophageal reflux disease)    History of acute prostatitis Fall 2008   History of kidney stones    Hyperlipidemia, mixed    Hypertension    Sleep apnea    Testicular pain 2008   prostatis, treated with Flomax by Dr. Achilles Dunk    Surgical History: Past Surgical History:  Procedure Laterality Date   COLONOSCOPY WITH PROPOFOL N/A 01/17/2018   Procedure: COLONOSCOPY WITH  PROPOFOL;  Surgeon: Earline Mayotte, MD;  Location: [facility] ENDOSCOPY;  Service: Endoscopy;  Laterality: N/A;   LITHOTRIPSY     NASAL SEPTUM SURGERY  2004   PELVIC LYMPH NODE DISSECTION Bilateral 12/12/2022   Procedure: PELVIC LYMPH NODE DISSECTION;  Surgeon: Sondra Come, MD;  Location: [facility] ORS;  Service: Urology;  Laterality: Bilateral;   ROBOT ASSISTED LAPAROSCOPIC RADICAL PROSTATECTOMY N/A 12/12/2022   Procedure: XI ROBOTIC ASSISTED LAPAROSCOPIC RADICAL PROSTATECTOMY;  Surgeon: Sondra Come, MD;  Location: [facility] ORS;  Service: Urology;  Laterality: N/A;    Home Medications:  Allergies as of 05/25/2023       Reactions   Tramadol Hcl Nausea Only        Medication List        Accurate as of May 25, 2023  4:28 PM. If you have any questions, ask your nurse or doctor.          acetaminophen 500 MG tablet Commonly known as: TYLENOL Take 1,000 mg by mouth every 6 (six) hours as needed.   Calcium-Vitamin D 600-5 MG-MCG Tabs Take 1,200 mg by mouth daily.   famotidine 20 MG tablet Commonly known as: PEPCID Take 1 tablet (20 mg total) by mouth 2 (two) times daily. What changed: when to take this   fluticasone 50 MCG/ACT nasal spray Commonly known as: FLONASE Place 2 sprays into both nostrils daily.   losartan 50 MG tablet Commonly  known as: COZAAR Take 1 tablet (50 mg total) by mouth at bedtime.   multivitamin with minerals tablet Take 1 tablet by mouth daily.   RED YEAST RICE PO Take 1 tablet by mouth daily.   tamsulosin 0.4 MG Caps capsule Commonly known as: FLOMAX Take 1-2 capsules (0.4-0.8 mg total) by mouth daily.        Allergies:  Allergies  Allergen Reactions   Tramadol Hcl Nausea Only    Family History: Family History  Problem Relation Age of Onset   Heart disease Father    Heart disease Maternal Grandmother    Diabetes Maternal Grandmother    Cancer Mother    Diabetes Paternal Grandmother    Heart disease Paternal Grandmother      Social History:   reports that he has never smoked. He has never been exposed to tobacco smoke. He has never used smokeless tobacco. He reports that he does not drink alcohol and does not use drugs.  Physical Exam: BP 137/88   Pulse 96   Constitutional:  Alert and oriented, no acute distress, nontoxic appearing HEENT: [place], AT Cardiovascular: No clubbing, cyanosis, or edema Respiratory: Normal respiratory effort, no increased work of breathing Skin: No rashes, bruises or suspicious lesions Neurologic: Grossly intact, no focal deficits, moving all 4 extremities Psychiatric: Normal mood and affect  Laboratory Data: Results for orders placed or performed in visit on 05/25/23  Microscopic Examination   Urine  Result Value Ref Range   WBC, UA 0-5 0 - 5 /hpf   RBC, Urine 0-2 0 - 2 /hpf   Epithelial Cells (non renal) 0-10 0 - 10 /hpf   Crystals Present (A) N/A   Crystal Type Amorphous Sediment N/A   Bacteria, UA Few None seen/Few  Urinalysis, Complete  Result Value Ref Range   Specific Gravity, UA 1.020 1.005 - 1.030   pH, UA 7.0 5.0 - 7.5   Color, UA Yellow Yellow   Appearance Ur Clear Clear   Leukocytes,UA Negative Negative   Protein,UA Negative Negative/Trace   Glucose, UA Negative Negative   Ketones, UA Negative Negative   RBC, UA Negative Negative   Bilirubin, UA Negative Negative   Urobilinogen, Ur 0.2 0.2 - 1.0 mg/dL   Nitrite, UA Negative Negative   Microscopic Examination See below:    Assessment & Plan:   1. Left ureteral stone He spontaneously passed a stone after his last office visit, will send for analysis today.  I asked him to leave a urine sample on his way out today and we will contact him with results. - Calculi, with Photograph (to Clinical Lab); Future - Urinalysis, Complete  2. Straining to void Resolved, possibly secondary to #1 above.  Continue to CIC as needed, however he does not appear to be needing this anymore.  He would like to keep plans for  upcoming cystoscopy given resistance met at the bladder neck, which is reasonable.  3. Stress incontinence I gave him condom catheter supplies today and demonstrated how to measure, apply, and remove these.  He can call me with his preferred size if he desires to order them.  Return for Will call with results.  Carman Ching, PA-C  [facility] [place] [address], [address] [place], [place] [address] [phone]

## 2023-05-26 ENCOUNTER — Other Ambulatory Visit: Payer: Self-pay | Admitting: Internal Medicine

## 2023-05-26 ENCOUNTER — Other Ambulatory Visit (INDEPENDENT_AMBULATORY_CARE_PROVIDER_SITE_OTHER): Payer: BC Managed Care – PPO

## 2023-05-26 DIAGNOSIS — R809 Proteinuria, unspecified: Secondary | ICD-10-CM

## 2023-05-26 LAB — URINALYSIS, COMPLETE
Bilirubin, UA: NEGATIVE
Glucose, UA: NEGATIVE
Ketones, UA: NEGATIVE
Leukocytes,UA: NEGATIVE
Nitrite, UA: NEGATIVE
Protein,UA: NEGATIVE
RBC, UA: NEGATIVE
Specific Gravity, UA: 1.02 (ref 1.005–1.030)
Urobilinogen, Ur: 0.2 mg/dL (ref 0.2–1.0)
pH, UA: 7 (ref 5.0–7.5)

## 2023-05-26 LAB — MICROSCOPIC EXAMINATION

## 2023-05-27 LAB — BASIC METABOLIC PANEL
BUN: 15 mg/dL (ref 7–25)
CO2: 30 mmol/L (ref 20–32)
Calcium: 9.7 mg/dL (ref 8.6–10.3)
Chloride: 104 mmol/L (ref 98–110)
Creat: 0.88 mg/dL (ref 0.70–1.30)
Glucose, Bld: 93 mg/dL (ref 65–99)
Potassium: 4.1 mmol/L (ref 3.5–5.3)
Sodium: 142 mmol/L (ref 135–146)

## 2023-05-29 ENCOUNTER — Ambulatory Visit
Admission: RE | Admit: 2023-05-29 | Discharge: 2023-05-29 | Disposition: A | Discharge status: Home / Self Care | Payer: BC Managed Care – PPO | Source: Ambulatory Visit | Attending: Radiation Oncology | Admitting: Radiation Oncology

## 2023-05-29 ENCOUNTER — Encounter: Payer: Self-pay | Admitting: Radiation Oncology

## 2023-05-29 VITALS — BP 143/93 | HR 92 | Temp 97.0°F | Resp 16 | Ht 68.0 in | Wt 229.7 lb

## 2023-05-29 DIAGNOSIS — C61 Malignant neoplasm of prostate: Secondary | ICD-10-CM | POA: Diagnosis not present

## 2023-05-29 NOTE — Progress Notes (Signed)
Radiation Oncology Follow up Note  Name: Tyler Peters   Date:   05/29/2023 MRN:  [id number] DOB: 11/29/1960    This 62 y.o. male presents to the clinic today for 1 month follow-up status post salvage radiation therapy for a stage IIIb Gleason 7 (4+3) adenocarcinoma prostate presenting with biochemical persistence after positive margins at the time of prostatectomy.  REFERRING PROVIDER: Sherlene Shams, MD  HPI: The patient, a 62 year old with stage 3B (PT3A, N0M0) Gleason 7 (4+3) adenocarcinoma of the prostate, presents for routine follow-up one month after completing salvage radiation therapy to the prostatic fossa. He was initially treated with surgery, but had biochemical persistence and positive margins postoperatively, necessitating the radiation therapy.  The patient reports no significant issues since the radiation therapy. His bowel movements have returned to his pre-radiation normal..  Patient did have a recent PSA at his GP's office which was undetectable.  COMPLICATIONS OF TREATMENT: none  FOLLOW UP COMPLIANCE: keeps appointments   PHYSICAL EXAM:  BP (!) 143/93   Pulse 92   Temp (!) 97 F (36.1 C)   Resp 16   Ht 5\' 8"  (1.727 m)   Wt 229 lb 11.2 oz (104.2 kg)   BMI 34.93 kg/m  Well-developed well-nourished patient in NAD. HEENT reveals PERLA, EOMI, discs not visualized.  Oral cavity is clear. No oral mucosal lesions are identified. Neck is clear without evidence of cervical or supraclavicular adenopathy. Lungs are clear to A&P. Cardiac examination is essentially unremarkable with regular rate and rhythm without murmur rub or thrill. Abdomen is benign with no organomegaly or masses noted. Motor sensory and DTR levels are equal and symmetric in the upper and lower extremities. Cranial nerves II through XII are grossly intact. Proprioception is intact. No peripheral adenopathy or edema is identified. No motor or sensory levels are noted. Crude visual fields are within normal  range.  RADIOLOGY RESULTS: No current films for review  PLAN: Prostate Adenocarcinoma (Stage 3B, PT3A, N0M0, Gleason 7 (4+3)) One month post salvage radiation therapy for biochemical persistence and positive margins after surgery. No current complaints. Recent PSA was undetectable. -Return in 3 months for follow-up. -Order PSA prior to next visit.  Urinary Incontinence Mild, likely secondary to prior prostate surgery. -Continue current management.  Bowel Function Normal post-radiation. -Continue current management.    Carmina Miller, MD

## 2023-06-01 LAB — CALCULI, WITH PHOTOGRAPH (CLINICAL LAB)
Calcium Oxalate Dihydrate: 20 %
Calcium Oxalate Monohydrate: 80 %
Weight Calculi: 42 mg

## 2023-06-16 ENCOUNTER — Other Ambulatory Visit: Payer: Self-pay

## 2023-06-16 DIAGNOSIS — C61 Malignant neoplasm of prostate: Secondary | ICD-10-CM

## 2023-06-19 ENCOUNTER — Other Ambulatory Visit: Payer: BC Managed Care – PPO

## 2023-06-19 DIAGNOSIS — C61 Malignant neoplasm of prostate: Secondary | ICD-10-CM

## 2023-06-20 ENCOUNTER — Other Ambulatory Visit: Payer: Self-pay | Admitting: Physician Assistant

## 2023-06-20 DIAGNOSIS — R3916 Straining to void: Secondary | ICD-10-CM

## 2023-06-20 LAB — PSA: Prostate Specific Ag, Serum: <0.1 ng/mL (ref 0.0–4.0)

## 2023-06-22 ENCOUNTER — Encounter: Payer: Self-pay | Admitting: Internal Medicine

## 2023-06-22 ENCOUNTER — Ambulatory Visit (INDEPENDENT_AMBULATORY_CARE_PROVIDER_SITE_OTHER): Payer: BC Managed Care – PPO | Admitting: Urology

## 2023-06-22 ENCOUNTER — Encounter: Payer: Self-pay | Admitting: Urology

## 2023-06-22 VITALS — BP 136/90 | HR 101 | Ht 68.0 in | Wt 230.0 lb

## 2023-06-22 DIAGNOSIS — R39198 Other difficulties with micturition: Secondary | ICD-10-CM

## 2023-06-22 DIAGNOSIS — N3289 Other specified disorders of bladder: Secondary | ICD-10-CM

## 2023-06-22 DIAGNOSIS — Z8546 Personal history of malignant neoplasm of prostate: Secondary | ICD-10-CM

## 2023-06-22 DIAGNOSIS — C61 Malignant neoplasm of prostate: Secondary | ICD-10-CM

## 2023-06-22 MED ORDER — SULFAMETHOXAZOLE-TRIMETHOPRIM 800-160 MG PO TABS
1.0000 | ORAL_TABLET | Freq: Once | ORAL | Status: AC
Start: 2023-06-22 — End: 2023-06-22
  Administered 2023-06-22: 1 via ORAL

## 2023-06-23 ENCOUNTER — Other Ambulatory Visit: Payer: BC Managed Care – PPO | Admitting: Urology

## 2023-06-23 NOTE — Progress Notes (Signed)
Cystoscopy Procedure Note:  Indication: Difficulty with urination  Healthy 62 year old male who presented with an elevated PSA of 4.7 was found of high risk prostate cancer on biopsy, no evidence of metastatic disease on staging imaging and he underwent robotic radical prostatectomy and bilateral pelvic lymph node dissection on 12/12/2022, there was extraprostatic extension present and positive margin at the right apex, lymph nodes were negative.  Postop PSA was 0.5 consistent with persistent disease and he recently completed salvage radiation and a 61-month ADT injection that was given in July 2024.  After undergoing radiation he had some difficulty with urination as well as a UTI and had been followed extensively by our PA Carman Ching in September and October 2024.  He was having to CIC intermittently, and was having some difficulty passing the catheter.  He also passed a kidney stone during this time.  He reports his urinary symptoms has resolved and he has not had to catheterize recently, voiding with a good stream, incontinence has improved significantly.  After informed consent and discussion of the procedure and its risks, Saketh L Claydon was positioned and prepped in the standard fashion. Cystoscopy was performed with a flexible cystoscope.  The vesicourethral anastomosis was tight, and I was unable to advance the scope into the bladder.  There did appear to be a subtle calcification at the bladder neck.  Procedure was terminated.  Findings: Tight bladder neck, unable to access bladder  -----------------------------------------------------------------------------  Assessment and Plan: 62 year old male with high risk prostate cancer status post robotic radical prostatectomy and lymph node dissection in May 2024, persistent postop PSA of 0.5 and completed salvage radiation and 45-month ADT injection.  Most recent PSA is undetectable.  He has had some problems with difficulty urinating  and having to CIC, as well as passed a kidney stone in the last 3 months in the fall 2024, however symptoms have now completely resolved and incontinence has improved.    Unable to access the bladder today secondary to narrowing at the vesicourethral junction, and there did appear to be a subtle calcification there.  We discussed options at length including observation, proceeding to the OR for dilation and cystoscopy with high risk of worsening incontinence.  Using shared decision making he opted for observation which I think is very reasonable, however if he has increased difficulty urinating, retention, UTIs, or dysuria would strongly recommend cystoscopy in the OR for further evaluation, however incontinence would likely worsen.  RTC 4 months PSA prior, PVR, sooner if problems  Legrand Rams, MD 06/23/2023

## 2023-06-27 NOTE — Telephone Encounter (Signed)
noted 

## 2023-07-05 DIAGNOSIS — R0602 Shortness of breath: Secondary | ICD-10-CM | POA: Diagnosis not present

## 2023-07-05 DIAGNOSIS — G4733 Obstructive sleep apnea (adult) (pediatric): Secondary | ICD-10-CM | POA: Diagnosis not present

## 2023-07-08 DIAGNOSIS — G4733 Obstructive sleep apnea (adult) (pediatric): Secondary | ICD-10-CM | POA: Diagnosis not present

## 2023-07-08 DIAGNOSIS — R0602 Shortness of breath: Secondary | ICD-10-CM | POA: Diagnosis not present

## 2023-07-14 ENCOUNTER — Encounter: Payer: Self-pay | Admitting: Internal Medicine

## 2023-07-14 ENCOUNTER — Telehealth: Payer: Self-pay

## 2023-07-14 DIAGNOSIS — G4733 Obstructive sleep apnea (adult) (pediatric): Secondary | ICD-10-CM

## 2023-07-14 NOTE — Telephone Encounter (Signed)
Sleep study results have been placed in results folder for review.

## 2023-07-17 NOTE — Telephone Encounter (Signed)
The sleep study results have been placed in the yellow results folder.

## 2023-07-17 NOTE — Telephone Encounter (Signed)
Recent 2 night home sleep study was positive for SEVERE OSA (AHI 50 om night #1  43 on Night 2)  room air sats were < 90% 25% of the itme dropping I nto the 60's  Spirometry (lung volumes,  lung function)  and room air sats with ambulation were normal   CPAP ordered

## 2023-07-17 NOTE — Assessment & Plan Note (Signed)
Recent 2 night home sleep study was positive for SEVERE OSA (AHI 50 om night #1  43 on Night 2)  room air sats were < 90% 25% of the itme dropping I nto the 60's  Spirometry and room air sats with ambulation were normal   CPAP ordered

## 2023-07-17 NOTE — Telephone Encounter (Signed)
Pt is aware and gave a verbal understanding.  

## 2023-07-20 ENCOUNTER — Telehealth: Payer: Self-pay | Admitting: Internal Medicine

## 2023-07-20 DIAGNOSIS — X32XXXA Exposure to sunlight, initial encounter: Secondary | ICD-10-CM | POA: Diagnosis not present

## 2023-07-20 DIAGNOSIS — D2262 Melanocytic nevi of left upper limb, including shoulder: Secondary | ICD-10-CM | POA: Diagnosis not present

## 2023-07-20 DIAGNOSIS — D225 Melanocytic nevi of trunk: Secondary | ICD-10-CM | POA: Diagnosis not present

## 2023-07-20 DIAGNOSIS — L820 Inflamed seborrheic keratosis: Secondary | ICD-10-CM | POA: Diagnosis not present

## 2023-07-20 DIAGNOSIS — D485 Neoplasm of uncertain behavior of skin: Secondary | ICD-10-CM | POA: Diagnosis not present

## 2023-07-20 DIAGNOSIS — D2272 Melanocytic nevi of left lower limb, including hip: Secondary | ICD-10-CM | POA: Diagnosis not present

## 2023-07-20 DIAGNOSIS — L905 Scar conditions and fibrosis of skin: Secondary | ICD-10-CM | POA: Diagnosis not present

## 2023-07-20 DIAGNOSIS — L570 Actinic keratosis: Secondary | ICD-10-CM | POA: Diagnosis not present

## 2023-07-20 DIAGNOSIS — D2261 Melanocytic nevi of right upper limb, including shoulder: Secondary | ICD-10-CM | POA: Diagnosis not present

## 2023-07-20 NOTE — Telephone Encounter (Signed)
Pt up front thats is asking about medications he was filed. He was wondering if he is on Lorstan or Hydrochlorothiazide because the pharmacy is switching his meds around and he wanted to be sure if he was taking the correct meds.  Please advise, Thanks

## 2023-07-20 NOTE — Telephone Encounter (Signed)
Hey I gotta pt up front thats asking about medications he was wondering if he is Lorstan or Hydrochlorothiazide because the pharmacy is switching his meds and he wanted to be sure if he was taking the correct meds that was prescibed to him ferom Ffr Tell./

## 2023-07-21 ENCOUNTER — Other Ambulatory Visit: Payer: Self-pay

## 2023-07-21 DIAGNOSIS — G4733 Obstructive sleep apnea (adult) (pediatric): Secondary | ICD-10-CM | POA: Diagnosis not present

## 2023-07-21 MED ORDER — LOSARTAN POTASSIUM 50 MG PO TABS: 50.0000 mg | ORAL_TABLET | Freq: Every day | ORAL | 3 refills | Status: DC

## 2023-07-21 NOTE — Telephone Encounter (Signed)
Spoke with pt to let him know that he should not be taking the hydrochlorothiazide, he should only be taking the Losartan. Pt stated that he has been but the pharmacy refilled the hydrochlorothiazide. Pt stated that they must have filled an old rx.

## 2023-07-21 NOTE — Telephone Encounter (Signed)
Spoke with pt to let him know that he should not be taking the hydrochlorothiazide, he should only be taking the losartan. Pt stated that he has been but the pharmacy filled the hydrochlorothiazide and he just wanted to make sure nothing else had changed. Pt stated that the pharmacy must have just filled an old rx.

## 2023-07-24 ENCOUNTER — Encounter: Payer: Self-pay | Admitting: Internal Medicine

## 2023-08-21 DIAGNOSIS — G4733 Obstructive sleep apnea (adult) (pediatric): Secondary | ICD-10-CM | POA: Diagnosis not present

## 2023-08-24 ENCOUNTER — Other Ambulatory Visit: Payer: Self-pay | Admitting: *Deleted

## 2023-08-24 DIAGNOSIS — C61 Malignant neoplasm of prostate: Secondary | ICD-10-CM

## 2023-08-28 ENCOUNTER — Inpatient Hospital Stay: Payer: BC Managed Care – PPO | Attending: Radiation Oncology

## 2023-08-28 DIAGNOSIS — C61 Malignant neoplasm of prostate: Secondary | ICD-10-CM | POA: Diagnosis not present

## 2023-08-28 LAB — PSA: Prostatic Specific Antigen: <0.01 ng/mL (ref 0.00–4.00)

## 2023-09-04 ENCOUNTER — Ambulatory Visit
Admission: RE | Admit: 2023-09-04 | Discharge: 2023-09-04 | Disposition: A | Discharge status: Home / Self Care | Payer: BC Managed Care – PPO | Source: Ambulatory Visit | Attending: Radiation Oncology | Admitting: Radiation Oncology

## 2023-09-04 ENCOUNTER — Encounter: Payer: Self-pay | Admitting: Radiation Oncology

## 2023-09-04 VITALS — BP 142/81 | HR 98 | Temp 98.4°F | Resp 20 | Wt 234.0 lb

## 2023-09-04 DIAGNOSIS — Z191 Hormone sensitive malignancy status: Secondary | ICD-10-CM | POA: Diagnosis not present

## 2023-09-04 DIAGNOSIS — Z923 Personal history of irradiation: Secondary | ICD-10-CM | POA: Diagnosis not present

## 2023-09-04 DIAGNOSIS — C61 Malignant neoplasm of prostate: Secondary | ICD-10-CM | POA: Insufficient documentation

## 2023-09-04 NOTE — Progress Notes (Signed)
Radiation Oncology Follow up Note  Name: Tyler Peters   Date:   09/04/2023 MRN:  [id number] DOB: [birth year]    This 63 y.o. male presents to the clinic today for 58-month follow-up status post salvage radiation therapy for stage IIIb Gleason 7 (4+3) adenocarcinoma presented with biochemical persistence after positive margins at time of prostatectomy  REFERRING PROVIDER: Sherlene Shams, MD  HPI: Patient is a 63 year old male now out for months having pleated salvage radiation therapy status post prostatectomy with positive margins and biochemical progression after prostatectomy.  Seen today in routine follow-up he is doing well he states he is having no increased lower Neri tract symptoms no diarrhea fatigue.  His most recent PSA is less than 0.01.Marland Kitchen  COMPLICATIONS OF TREATMENT: none  FOLLOW UP COMPLIANCE: keeps appointments   PHYSICAL EXAM:  BP (!) 142/81 Comment: Patients states normal for him  Pulse 98   Temp 98.4 F (36.9 C)   Resp 20   Wt 234 lb (106.1 kg)   BMI 35.58 kg/m  Well-developed well-nourished patient in NAD. HEENT reveals PERLA, EOMI, discs not visualized.  Oral cavity is clear. No oral mucosal lesions are identified. Neck is clear without evidence of cervical or supraclavicular adenopathy. Lungs are clear to A&P. Cardiac examination is essentially unremarkable with regular rate and rhythm without murmur rub or thrill. Abdomen is benign with no organomegaly or masses noted. Motor sensory and DTR levels are equal and symmetric in the upper and lower extremities. Cranial nerves II through XII are grossly intact. Proprioception is intact. No peripheral adenopathy or edema is identified. No motor or sensory levels are noted. Crude visual fields are within normal range.  RADIOLOGY RESULTS: No current films for review  PLAN: Present time patient is doing well with a low side effect profile.  I am pleased with his overall progress that he is under excellent biochemical control of  his prostate cancer.  I have asked to see him back in 6 months with repeat PSA.  Patient knows to call at anytime with any concerns.  I would like to take this opportunity to thank you for allowing me to participate in the care of your patient.Carmina Miller, MD

## 2023-09-21 DIAGNOSIS — G4733 Obstructive sleep apnea (adult) (pediatric): Secondary | ICD-10-CM | POA: Diagnosis not present

## 2023-10-04 ENCOUNTER — Encounter: Payer: Self-pay | Admitting: Internal Medicine

## 2023-10-04 ENCOUNTER — Ambulatory Visit (INDEPENDENT_AMBULATORY_CARE_PROVIDER_SITE_OTHER): Payer: BC Managed Care – PPO | Admitting: Internal Medicine

## 2023-10-04 VITALS — BP 128/72 | HR 103 | Ht 68.0 in | Wt 234.6 lb

## 2023-10-04 DIAGNOSIS — I10 Essential (primary) hypertension: Secondary | ICD-10-CM

## 2023-10-04 DIAGNOSIS — G4733 Obstructive sleep apnea (adult) (pediatric): Secondary | ICD-10-CM | POA: Diagnosis not present

## 2023-10-04 DIAGNOSIS — R7303 Prediabetes: Secondary | ICD-10-CM

## 2023-10-04 DIAGNOSIS — C61 Malignant neoplasm of prostate: Secondary | ICD-10-CM | POA: Diagnosis not present

## 2023-10-04 DIAGNOSIS — E782 Mixed hyperlipidemia: Secondary | ICD-10-CM

## 2023-10-04 LAB — MICROALBUMIN / CREATININE URINE RATIO
Creatinine,U: 136.4 mg/dL
Microalb Creat Ratio: 18.4 mg/g (ref 0.0–30.0)
Microalb, Ur: 2.5 mg/dL — ABNORMAL HIGH (ref 0.0–1.9)

## 2023-10-04 LAB — COMPREHENSIVE METABOLIC PANEL
ALT: 17 U/L (ref 0–53)
AST: 18 U/L (ref 0–37)
Albumin: 4.1 g/dL (ref 3.5–5.2)
Alkaline Phosphatase: 61 U/L (ref 39–117)
BUN: 16 mg/dL (ref 6–23)
CO2: 30 meq/L (ref 19–32)
Calcium: 9.1 mg/dL (ref 8.4–10.5)
Chloride: 103 meq/L (ref 96–112)
Creatinine, Ser: 0.91 mg/dL (ref 0.40–1.50)
GFR: 91.78 mL/min (ref >60.00)
Glucose, Bld: 99 mg/dL (ref 70–99)
Potassium: 4 meq/L (ref 3.5–5.1)
Sodium: 139 meq/L (ref 135–145)
Total Bilirubin: 0.3 mg/dL (ref 0.2–1.2)
Total Protein: 6.9 g/dL (ref 6.0–8.3)

## 2023-10-04 LAB — LIPID PANEL
Cholesterol: 186 mg/dL (ref 0–200)
HDL: 40.9 mg/dL (ref >39.00)
LDL Cholesterol: 105 mg/dL — ABNORMAL HIGH (ref 0–99)
NonHDL: 145.4
Total CHOL/HDL Ratio: 5
Triglycerides: 200 mg/dL — ABNORMAL HIGH (ref 0.0–149.0)
VLDL: 40 mg/dL (ref 0.0–40.0)

## 2023-10-04 LAB — LDL CHOLESTEROL, DIRECT: Direct LDL: 120 mg/dL

## 2023-10-04 MED ORDER — TIZANIDINE HCL 4 MG PO TABS: 4.0000 mg | ORAL_TABLET | Freq: Four times a day (QID) | ORAL | 0 refills | Status: AC | PRN

## 2023-10-04 NOTE — Patient Instructions (Addendum)
 I'm glad the CPAP is improving your sleep and wakefulness!  We will document every 6 months  Your 10 yr risk of CAD using the AHA risk calculator is now 11% based on September 2024 lipid panel.  We are rechecking today .  You also have mild atherosclerosis in your aorta, which was noted on the CT you had recently, so I am recommending that you consider a trial of atorvastatin or rosuvastatin f your LDL is > 70 on today's labs

## 2023-10-04 NOTE — Assessment & Plan Note (Signed)
 Diagnosed by sleep study. he is wearing her CPAP every night a minimum of 5 hours per night and notes improved daytime wakefulness and decreased fatigue

## 2023-10-04 NOTE — Assessment & Plan Note (Addendum)
 Current his 10 yr risk of CAD and OSA has improved from  13% . To < 11%  without statin use  Lab Results  Component Value Date   CHOL 186 10/04/2023   HDL 40.90 10/04/2023   LDLCALC 105 (H) 10/04/2023   LDLDIRECT 120.0 10/04/2023   TRIG 200.0 (H) 10/04/2023   CHOLHDL 5 10/04/2023

## 2023-10-04 NOTE — Progress Notes (Addendum)
 Subjective:  Patient ID: Tyler Peters, male    DOB: 10/05/1960  Age: 63 y.o. MRN: [id number]  CC: The primary encounter diagnosis was Hypertension, essential. Diagnoses of Hyperlipidemia, mixed, White coat syndrome with hypertension, Prostate cancer (HCC), OSA (obstructive sleep apnea), and Prediabetes were also pertinent to this visit.   HPI Tyler Peters presents for follow up on  multiple issues  Chief Complaint  Patient presents with   Medical Management of Chronic Issues    6 month follow up    Prostate CA:  he continues to have follow up with dr Richardo Hanks ; last PSA undetectable   Social:  he was laid off in April after 29 years due to company being acquired  and he is considering permanent retirement . 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 Also involved with  care of in laws.   Mother in law hospitalized  in January for 4 weeks followed by skilled nursing /rehab.  Wife Joni Reining isdriving to [place] several times per week to help both  parents .   Some rhinitis started yesterday  no facial pain, sor throat wheezing or fevers.    Back spasming since he sanded a floor last week  , no radiation or leg weakness    White coat hypertension  home readings 120-130/60 to 78   OSA:  he has  been using  CPAP  since Dec 20  .  Events have dropped to < 5 /hr and he is averaging >5 hours per night of use..  he notes significant improvement in his daytime wakefulness  has noted that during camping events with the [employer]  there is  increased condensation in the tubes when the ambient room temperature  drops < 60.     Outpatient Medications Prior to Visit  Medication Sig Dispense Refill   acetaminophen (TYLENOL) 500 MG tablet Take 1,000 mg by mouth every 6 (six) hours as needed.     Calcium-Vitamin D 600-5 MG-MCG TABS Take 1,200 mg by mouth daily.     famotidine (PEPCID) 20 MG tablet Take 1 tablet (20 mg total) by mouth 2 (two)  times daily. 90 tablet 1   fluticasone (FLONASE) 50 MCG/ACT nasal spray Place 2 sprays into both nostrils daily. 16 g 6   losartan (COZAAR) 50 MG tablet Take 1 tablet (50 mg total) by mouth at bedtime. 90 tablet 3   Multiple Vitamins-Minerals (MULTIVITAMIN WITH MINERALS) tablet Take 1 tablet by mouth daily.     Red Yeast Rice Extract (RED YEAST RICE PO) Take 1 tablet by mouth daily.     No facility-administered medications prior to visit.    Review of Systems;  Patient denies headache, fevers, malaise, unintentional weight loss, skin rash, eye pain, sinus congestion and sinus pain, sore throat, dysphagia,  hemoptysis , cough, dyspnea, wheezing, chest pain, palpitations, orthopnea, edema, abdominal pain, nausea, melena, diarrhea, constipation, flank pain, dysuria, hematuria, urinary  Frequency, nocturia, numbness, tingling, seizures,  Focal weakness, Loss of consciousness,  Tremor, insomnia, depression, anxiety, and suicidal ideation.      Objective:  BP 128/72   Pulse (!) 103   Ht 5\' 8"  (1.727 m)   Wt 234 lb 9.6 oz (106.4 kg)   SpO2 95%   BMI 35.67 kg/m   BP Readings from Last 3 Encounters:  10/04/23 128/72  09/04/23 (!) 142/81  06/22/23 (!) 136/90    Wt Readings from Last 3  Encounters:  10/04/23 234 lb 9.6 oz (106.4 kg)  09/04/23 234 lb (106.1 kg)  06/22/23 230 lb (104.3 kg)    Physical Exam Vitals reviewed.  Constitutional:      General: He is not in acute distress.    Appearance: Normal appearance. He is normal weight. He is not ill-appearing, toxic-appearing or diaphoretic.  HENT:     Head: Normocephalic.  Eyes:     General: No scleral icterus.       Right eye: No discharge.        Left eye: No discharge.     Conjunctiva/sclera: Conjunctivae normal.  Cardiovascular:     Rate and Rhythm: Normal rate and regular rhythm.     Heart sounds: Normal heart sounds.  Pulmonary:     Effort: Pulmonary effort is normal. No respiratory distress.     Breath sounds: Normal  breath sounds.  Musculoskeletal:        General: Normal range of motion.     Cervical back: Normal range of motion.  Skin:    General: Skin is warm and dry.  Neurological:     General: No focal deficit present.     Mental Status: He is alert and oriented to person, place, and time. Mental status is at baseline.  Psychiatric:        Mood and Affect: Mood normal.        Behavior: Behavior normal.        Thought Content: Thought content normal.        Judgment: Judgment normal.    Lab Results  Component Value Date   HGBA1C 6.3 04/26/2023   HGBA1C 6.2 04/22/2022   HGBA1C 5.9 06/07/2021    Lab Results  Component Value Date   CREATININE 0.91 10/04/2023   CREATININE 0.88 05/26/2023   CREATININE 0.89 04/26/2023    Lab Results  Component Value Date   WBC 4.5 04/26/2023   HGB 13.4 04/26/2023   HCT 39.5 04/26/2023   PLT 276.0 04/26/2023   GLUCOSE 99 10/04/2023   CHOL 186 10/04/2023   TRIG 200.0 (H) 10/04/2023   HDL 40.90 10/04/2023   LDLDIRECT 120.0 10/04/2023   LDLCALC 105 (H) 10/04/2023   ALT 17 10/04/2023   AST 18 10/04/2023   NA 139 10/04/2023   K 4.0 10/04/2023   CL 103 10/04/2023   CREATININE 0.91 10/04/2023   BUN 16 10/04/2023   CO2 30 10/04/2023   TSH 1.73 04/26/2023   PSA 0.00 (L) 04/26/2023   INR 1.0 12/05/2022   HGBA1C 6.3 04/26/2023   MICROALBUR 2.5 (H) 10/04/2023    No results found.  Assessment & Plan:  .Hypertension, essential Assessment & Plan: With proteinuria ,tolerating  50 mg  losartan . Repeat urine test today shows significant reduction in proteinuria  On losartan 50 mg Lab Results  Component Value Date   LABMICR See below: 05/25/2023   LABMICR See below: 05/12/2023   MICROALBUR 2.5 (H) 10/04/2023   MICROALBUR 29.5 (H) 04/26/2023   Lab Results  Component Value Date   CREATININE 0.91 10/04/2023   Lab Results  Component Value Date   NA 139 10/04/2023   K 4.0 10/04/2023   CL 103 10/04/2023   CO2 30 10/04/2023        Orders: -     Comprehensive metabolic panel -     Microalbumin / creatinine urine ratio  Hyperlipidemia, mixed Assessment & Plan: Current his 10 yr risk of CAD and OSA has improved from  13% . To <  11%  without statin use  Lab Results  Component Value Date   CHOL 186 10/04/2023   HDL 40.90 10/04/2023   LDLCALC 105 (H) 10/04/2023   LDLDIRECT 120.0 10/04/2023   TRIG 200.0 (H) 10/04/2023   CHOLHDL 5 10/04/2023     Orders: -     Lipid panel -     LDL cholesterol, direct  White coat syndrome with hypertension Assessment & Plan: With proteinuria ,tolerating  50 mg  losartan . Repeat urine test today shows significant reduction in proteinuria  On losartan 50 mg Lab Results  Component Value Date   LABMICR See below: 05/25/2023   LABMICR See below: 05/12/2023   MICROALBUR 2.5 (H) 10/04/2023   MICROALBUR 29.5 (H) 04/26/2023   Lab Results  Component Value Date   CREATININE 0.91 10/04/2023   Lab Results  Component Value Date   NA 139 10/04/2023   K 4.0 10/04/2023   CL 103 10/04/2023   CO2 30 10/04/2023        Prostate cancer [facility]) Assessment & Plan: He has finished XRT .  PSA  has been undetectable   Lab Results  Component Value Date   PSA1 <0.1 06/19/2023   PSA1 0.5 01/19/2023   PSA1 4.7 (H) 08/22/2022   PSA 0.00 (L) 04/26/2023   PSA 3.63 04/22/2022   PSA 1.43 06/07/2021       OSA (obstructive sleep apnea) Assessment & Plan: Diagnosed by sleep study. he is wearing her CPAP every night a minimum of 5 hours per night and notes improved daytime wakefulness and decreased fatigue     Prediabetes Assessment & Plan: His  A1c continue to suggest that he is at risk for developing diabetes.  I have recommended continued efforts at weight loss with adherence to a low a low glycemic index diet and particpation  regularly in an aerobic  exercise activity. Lab Results  Component Value Date   HGBA1C 6.3 04/26/2023       Other orders -      tiZANidine HCl; Take 1 tablet (4 mg total) by mouth every 6 (six) hours as needed for muscle spasms.  Dispense: 30 tablet; Refill: 0     I spent 34 minutes on the day of this face to face encounter reviewing patient's  most recent visit with Urology,   prior relevant surgical and non surgical procedures, recent  labs and imaging studies, counseling on stress management,  reviewing the assessment and plan with patient, and post visit ordering and reviewing of  diagnostics and therapeutics with patient  .   Follow-up: Return in about 6 months (around 04/05/2024) for physical.   Sherlene Shams, MD

## 2023-10-04 NOTE — Assessment & Plan Note (Addendum)
 With proteinuria ,tolerating  50 mg  losartan . Repeat urine test today shows significant reduction in proteinuria  On losartan 50 mg Lab Results  Component Value Date   LABMICR See below: 05/25/2023   LABMICR See below: 05/12/2023   MICROALBUR 2.5 (H) 10/04/2023   MICROALBUR 29.5 (H) 04/26/2023   Lab Results  Component Value Date   CREATININE 0.91 10/04/2023   Lab Results  Component Value Date   NA 139 10/04/2023   K 4.0 10/04/2023   CL 103 10/04/2023   CO2 30 10/04/2023

## 2023-10-04 NOTE — Assessment & Plan Note (Addendum)
 He has finished XRT .  PSA  has been undetectable   Lab Results  Component Value Date   PSA1 <0.1 06/19/2023   PSA1 0.5 01/19/2023   PSA1 4.7 (H) 08/22/2022   PSA 0.00 (L) 04/26/2023   PSA 3.63 04/22/2022   PSA 1.43 06/07/2021

## 2023-10-06 ENCOUNTER — Encounter: Payer: Self-pay | Admitting: Internal Medicine

## 2023-10-06 NOTE — Assessment & Plan Note (Signed)
 His  A1c continue to suggest that he is at risk for developing diabetes.  I have recommended continued efforts at weight loss with adherence to a low a low glycemic index diet and particpation  regularly in an aerobic  exercise activity. Lab Results  Component Value Date   HGBA1C 6.3 04/26/2023

## 2023-10-11 ENCOUNTER — Telehealth: Payer: Self-pay | Admitting: Internal Medicine

## 2023-10-11 DIAGNOSIS — R809 Proteinuria, unspecified: Secondary | ICD-10-CM | POA: Insufficient documentation

## 2023-10-11 NOTE — Assessment & Plan Note (Signed)
 Ratio has improved on losartan and is < 20.  No changes needed,  patient notified and repeat in 6 months planned

## 2023-10-17 ENCOUNTER — Other Ambulatory Visit: Payer: BC Managed Care – PPO

## 2023-10-17 DIAGNOSIS — C61 Malignant neoplasm of prostate: Secondary | ICD-10-CM | POA: Diagnosis not present

## 2023-10-18 LAB — PSA: Prostate Specific Ag, Serum: <0.1 ng/mL (ref 0.0–4.0)

## 2023-10-19 ENCOUNTER — Ambulatory Visit (INDEPENDENT_AMBULATORY_CARE_PROVIDER_SITE_OTHER): Payer: BC Managed Care – PPO | Admitting: Urology

## 2023-10-19 DIAGNOSIS — N393 Stress incontinence (female) (male): Secondary | ICD-10-CM | POA: Diagnosis not present

## 2023-10-19 DIAGNOSIS — N529 Male erectile dysfunction, unspecified: Secondary | ICD-10-CM

## 2023-10-19 DIAGNOSIS — C61 Malignant neoplasm of prostate: Secondary | ICD-10-CM | POA: Diagnosis not present

## 2023-10-19 DIAGNOSIS — G4733 Obstructive sleep apnea (adult) (pediatric): Secondary | ICD-10-CM | POA: Diagnosis not present

## 2023-10-19 LAB — BLADDER SCAN AMB NON-IMAGING

## 2023-10-19 MED ORDER — TADALAFIL 5 MG PO TABS
5.0000 mg | ORAL_TABLET | Freq: Every day | ORAL | 11 refills | Status: AC | PRN
Start: 2023-10-19 — End: ?

## 2023-10-19 MED ORDER — TAMSULOSIN HCL 0.4 MG PO CAPS: 0.4000 mg | ORAL_CAPSULE | Freq: Every day | ORAL | 11 refills | Status: AC

## 2023-10-19 NOTE — Progress Notes (Signed)
   10/19/2023 1:49 PM   ANQUAN AZZARELLO 1961-05-25 [id number]  Reason for visit: Follow up prostate cancer, urinary symptoms, ED  HPI: Healthy 63 year old male who presented with an elevated PSA of 4.7 was found of high risk prostate cancer on biopsy, no evidence of metastatic disease on staging imaging and he underwent robotic radical prostatectomy and bilateral pelvic lymph node dissection on 12/12/2022, there was extraprostatic extension present and positive margin at the right apex, lymph nodes were negative. Postop PSA was 0.5 consistent with persistent disease and he completed salvage radiation(04/2023) and a 22-month ADT injection that was given in July 2024. PSA has been undetectable since that time, most recently 10/17/2023.  He had some problems with urination and actually was on CIC briefly, as well as passed a kidney stone in the fall 2024.  He underwent cystoscopy on 06/22/2023 that showed narrowing at the vesicourethral anastomosis and a possible subtle calcification there, unable to advance the scope into the bladder.  He opted to defer any further treatments as his incontinence was improving and denied significant urinary symptoms at that time.  He overall has done well since our last visit.  He had an episode at the end of February 2025 with some decreased urination and tried to pass a catheter but was unable to advance this into the bladder, has been urinating fine since then.  He also resumed Flomax twice daily which he felt was helpful.  He is having some stress incontinence during the day with physical activity and wears a few pads during the day.  He does not have any leakage when sitting or overnight.  PVR today normal at 0ml.  Has not had any erections since surgery, has not tried any medications.  Was interested in a trial of Cialis and risk and benefits discussed.  -Okay to continue Flomax daily if he feels helpful -Trial of Cialis 5 to 20 mg on demand for ED -RTC August 2025,  will get PSA prior through radiation oncology -If persistent incontinence and/or ED at that time consider referral to Dr. Lafonda Mosses in [place] to consider surgical options    Sondra Come, MD  [facility] [address] [place], [place] [address] [phone]

## 2023-10-25 ENCOUNTER — Ambulatory Visit: Payer: BC Managed Care – PPO | Admitting: Internal Medicine

## 2024-01-19 DIAGNOSIS — G4733 Obstructive sleep apnea (adult) (pediatric): Secondary | ICD-10-CM | POA: Diagnosis not present

## 2024-01-20 ENCOUNTER — Emergency Department
Admission: EM | Admit: 2024-01-20 | Discharge: 2024-01-20 | Disposition: A | Discharge status: Home / Self Care | Attending: Emergency Medicine | Admitting: Emergency Medicine

## 2024-01-20 ENCOUNTER — Other Ambulatory Visit: Payer: Self-pay

## 2024-01-20 DIAGNOSIS — R319 Hematuria, unspecified: Secondary | ICD-10-CM | POA: Diagnosis not present

## 2024-01-20 DIAGNOSIS — N390 Urinary tract infection, site not specified: Secondary | ICD-10-CM | POA: Insufficient documentation

## 2024-01-20 LAB — BASIC METABOLIC PANEL WITH GFR
Anion gap: 7 (ref 5–15)
BUN: 23 mg/dL — ABNORMAL HIGH (ref 6–20)
CO2: 25 mmol/L (ref 22–32)
Calcium: 9.3 mg/dL (ref 8.9–10.3)
Chloride: 106 mmol/L (ref 98–111)
Creatinine, Ser: 0.92 mg/dL (ref 0.61–1.24)
GFR, Estimated: >60 mL/min (ref >60)
Glucose, Bld: 99 mg/dL (ref 70–99)
Potassium: 4 mmol/L (ref 3.5–5.1)
Sodium: 138 mmol/L (ref 135–145)

## 2024-01-20 LAB — URINALYSIS, ROUTINE W REFLEX MICROSCOPIC
Bilirubin Urine: NEGATIVE
Glucose, UA: NEGATIVE mg/dL
Ketones, ur: NEGATIVE mg/dL
Nitrite: NEGATIVE
Protein, ur: NEGATIVE mg/dL
Specific Gravity, Urine: 1.021 (ref 1.005–1.030)
pH: 5 (ref 5.0–8.0)

## 2024-01-20 LAB — CBC
HCT: 38.5 % — ABNORMAL LOW (ref 39.0–52.0)
Hemoglobin: 12.4 g/dL — ABNORMAL LOW (ref 13.0–17.0)
MCH: 28.6 pg (ref 26.0–34.0)
MCHC: 32.2 g/dL (ref 30.0–36.0)
MCV: 88.9 fL (ref 80.0–100.0)
Platelets: 255 10*3/uL (ref 150–400)
RBC: 4.33 MIL/uL (ref 4.22–5.81)
RDW: 13.8 % (ref 11.5–15.5)
WBC: 5.1 10*3/uL (ref 4.0–10.5)
nRBC: 0 % (ref 0.0–0.2)

## 2024-01-20 MED ORDER — CEFPODOXIME PROXETIL 200 MG PO TABS: 200.0000 mg | ORAL_TABLET | Freq: Two times a day (BID) | ORAL | 0 refills | Status: AC

## 2024-01-20 NOTE — ED Triage Notes (Signed)
 Pt to ED for hematuria since about 2 weeks ago. Was intermittent, then last several days has been more constant. Color is pink-red. No flank pain, back pain or dysuria but does feel something at the end of urinating but not really painful. Normal caliber of urine stream. Had radical prostatectomy 5/24.

## 2024-01-20 NOTE — ED Provider Notes (Signed)
 [facility] Provider Note    Event Date/Time   First MD Initiated Contact with Patient 01/20/24 1500     (approximate)   History   Hematuria   HPI  Tyler Peters is a 63 y.o. male who presents to the emergency department today because of concerns for hematuria.  Patient first noticed some blood in his urine a couple of weeks ago.  The patient states that over the past couple days it has gotten more severe.  He has noticed some red blood in the bottom of the toilet bowl.  Has some burning at the end of urination.  Denies any abdominal pain.  No fevers or chills.   Physical Exam   Triage Vital Signs: ED Triage Vitals  Encounter Vitals Group     BP 01/20/24 1416 128/80     Girls Systolic BP Percentile --      Girls Diastolic BP Percentile --      Boys Systolic BP Percentile --      Boys Diastolic BP Percentile --      Pulse Rate 01/20/24 1416 80     Resp 01/20/24 1416 16     Temp 01/20/24 1416 98.6 F (37 C)     Temp Source 01/20/24 1416 Oral     SpO2 01/20/24 1416 100 %     Weight 01/20/24 1416 230 lb (104.3 kg)     Height 01/20/24 1416 5' 8 (1.727 m)     Head Circumference --      Peak Flow --      Pain Score 01/20/24 1417 0     Pain Loc --      Pain Education --      Exclude from Growth Chart --     Most recent vital signs: Vitals:   01/20/24 1416  BP: 128/80  Pulse: 80  Resp: 16  Temp: 98.6 F (37 C)  SpO2: 100%   General: Awake, alert, oriented. CV:  Good peripheral perfusion. Regular rate and rhythm. Resp:  Normal effort. Lungs clear. Abd:  No distention. Non tender.   ED Results / Procedures / Treatments   Labs (all labs ordered are listed, but only abnormal results are displayed) Labs Reviewed  BASIC METABOLIC PANEL WITH GFR - Abnormal; Notable for the following components:      Result Value   BUN 23 (*)    All other components within normal limits  CBC - Abnormal; Notable for the following components:   Hemoglobin  12.4 (*)    HCT 38.5 (*)    All other components within normal limits  URINALYSIS, ROUTINE W REFLEX MICROSCOPIC     EKG  None   RADIOLOGY None   PROCEDURES:  Critical Care performed: No  MEDICATIONS ORDERED IN ED: Medications - No data to display   IMPRESSION / MDM / ASSESSMENT AND PLAN / ED COURSE  I reviewed the triage vital signs and the nursing notes.                              Differential diagnosis includes, but is not limited to, UTI, neoplasm, stone  Patient's presentation is most consistent with acute presentation with potential threat to life or bodily function.   Patient presented to the emergency department today with primary concern for hematuria.  Blood work without any concerning elevation of creatinine.  No leukocytosis.  Urine does show red blood cells as well as some  white blood cells.  Given the white blood cells in urine and complaint of burning do wonder if patient is suffering from urinary tract infection.  I did discuss this with the patient.  Will send urine for culture.  Will give patient course of antibiotics.  Did encourage patient to follow-up with his urologist.     FINAL CLINICAL IMPRESSION(S) / ED DIAGNOSES   Final diagnoses:  Hematuria, unspecified type  Lower urinary tract infectious disease        Note:  This document was prepared using Dragon voice recognition software and may include unintentional dictation errors.    Floy Roberts, MD 01/20/24 1556

## 2024-01-20 NOTE — ED Notes (Signed)
 Pt has had intermittent blood in urine for last two weeks. No difficulty starting stream or painful but feels weird at the end. Pt had radical prostatectomy  a year ago. Has had mild incontinence and urinary frequency since procedure.

## 2024-01-23 LAB — URINE CULTURE

## 2024-01-24 DIAGNOSIS — D225 Melanocytic nevi of trunk: Secondary | ICD-10-CM | POA: Diagnosis not present

## 2024-01-24 DIAGNOSIS — D2262 Melanocytic nevi of left upper limb, including shoulder: Secondary | ICD-10-CM | POA: Diagnosis not present

## 2024-01-24 DIAGNOSIS — D2261 Melanocytic nevi of right upper limb, including shoulder: Secondary | ICD-10-CM | POA: Diagnosis not present

## 2024-01-24 DIAGNOSIS — L910 Hypertrophic scar: Secondary | ICD-10-CM | POA: Diagnosis not present

## 2024-01-24 DIAGNOSIS — D2272 Melanocytic nevi of left lower limb, including hip: Secondary | ICD-10-CM | POA: Diagnosis not present

## 2024-02-08 NOTE — Progress Notes (Unsigned)
 02/09/2024 7:35 PM   CHARLETON Peters 02-Jul-1961 [id number]  Referring provider: Marylynn Verneita LITTIE, MD [address] [place],  [place] [address]  Urological history: 1. High risk prostate cancer - PSA (08/2023) <0.01  - robotic prostatectomy (2024)  - salvage radiation (2024)  - 6 month ADT (completed 01/2023)   2.  Nephrolithiasis - Spontaneous passage of stone (2024)   3. ED - tadalafil  5 mg, 1 to 4 tablets, on-demand-dosing  4. Incomplete bladder emptying - CIC briefly -cysto (2024) narrowing at the vesicular urethral anastomosis and possible subtle calcification - Tamsulosin  0.4 mg twice daily  5. SUI - Incontinent pads  Chief Complaint  Patient presents with   Prostate Cancer   HPI: Tyler Peters is a 63 y.o. man who presents today for symptoms not resolved after antibiotic.  Previous records reviewed.   He was seen in the ED on January 20, 2024 for intermittent gross hematuria with burning at the end of urination for 2 weeks.  Urinalysis was positive for leukocytes and hematuria.  Urine culture positive for Klebsiella pneumonia.   He was prescribed Vantin  for 10 days.     He has been having symptoms of intermittent gross hematuria and a weird sensation at the end of his penis.  The gross heme has abated after taken the antibiotics, but the discomfort at the end of his penis persists.    He is taking tamsulosin  0.4 mg daily and tadalafil  5 mg daily.    Patient denies any modifying or aggravating factors.  Patient denies any suprapubic or flank pain.  Patient denies any fevers, chills, nausea or vomiting.    UA w/ micro heme and bacteria.    PVR 0 mL   He continues to have base line mild incontinence.    Lab Results  Component Value Date   WBC 5.1 01/20/2024   HGB 12.4 (L) 01/20/2024   HCT 38.5 (L) 01/20/2024   MCV 88.9 01/20/2024   PLT 255 01/20/2024   Lab Results  Component Value Date   CREATININE 0.92 01/20/2024   Lab Results   Component Value Date   HGBA1C 6.3 04/26/2023    PMH: Past Medical History:  Diagnosis Date   Cancer [facility])    prostate   Complication of anesthesia    slow awake   Episodic recurrent vertigo    evaluated by Dr. Herminio with normal MRI   Episodic recurrent vertigo    normal MRI    GERD (gastroesophageal reflux disease)    History of acute prostatitis Fall 2008   History of kidney stones    Hyperlipidemia, mixed    Hypertension    Sleep apnea    Testicular pain 2008   prostatis, treated with Flomax  by Dr. Ike    Surgical History: Past Surgical History:  Procedure Laterality Date   COLONOSCOPY WITH PROPOFOL  N/A 01/17/2018   Procedure: COLONOSCOPY WITH PROPOFOL ;  Surgeon: Dessa Reyes ORN, MD;  Location: [facility] ENDOSCOPY;  Service: Endoscopy;  Laterality: N/A;   LITHOTRIPSY     NASAL SEPTUM SURGERY  2004   PELVIC LYMPH NODE DISSECTION Bilateral 12/12/2022   Procedure: PELVIC LYMPH NODE DISSECTION;  Surgeon: Francisca Redell BROCKS, MD;  Location: [facility] ORS;  Service: Urology;  Laterality: Bilateral;   ROBOT ASSISTED LAPAROSCOPIC RADICAL PROSTATECTOMY N/A 12/12/2022   Procedure: XI ROBOTIC ASSISTED LAPAROSCOPIC RADICAL PROSTATECTOMY;  Surgeon: Francisca Redell BROCKS, MD;  Location: [facility] ORS;  Service: Urology;  Laterality: N/A;    Home Medications:  Allergies  as of 02/09/2024       Reactions   Tramadol  Hcl Nausea Only        Medication List        Accurate as of February 09, 2024 11:59 PM. If you have any questions, ask your nurse or doctor.          acetaminophen  500 MG tablet Commonly known as: TYLENOL  Take 1,000 mg by mouth every 6 (six) hours as needed.   Calcium-Vitamin D  600-5 MG-MCG Tabs Take 1,200 mg by mouth daily.   famotidine  20 MG tablet Commonly known as: PEPCID  Take 1 tablet (20 mg total) by mouth 2 (two) times daily.   fluticasone  50 MCG/ACT nasal spray Commonly known as: FLONASE  Place 2 sprays into both nostrils daily.   hydrochlorothiazide  25 MG  tablet Commonly known as: HYDRODIURIL  Take 25 mg by mouth daily.   losartan  50 MG tablet Commonly known as: COZAAR  Take 1 tablet (50 mg total) by mouth at bedtime.   multivitamin with minerals tablet Take 1 tablet by mouth daily.   RED YEAST RICE PO Take 1 tablet by mouth daily.   sulfamethoxazole -trimethoprim  800-160 MG tablet Commonly known as: BACTRIM  DS Take 1 tablet by mouth every 12 (twelve) hours.   tadalafil  5 MG tablet Commonly known as: CIALIS  Take 1-4 tablets (5-20 mg total) by mouth daily as needed for erectile dysfunction (take 45 minutes prior to sexual activity).   tamsulosin  0.4 MG Caps capsule Commonly known as: FLOMAX  Take 1 capsule (0.4 mg total) by mouth daily after supper.   tiZANidine  4 MG tablet Commonly known as: Zanaflex  Take 1 tablet (4 mg total) by mouth every 6 (six) hours as needed for muscle spasms.        Allergies:  Allergies  Allergen Reactions   Tramadol  Hcl Nausea Only    Family History: Family History  Problem Relation Age of Onset   Heart disease Father    Heart disease Maternal Grandmother    Diabetes Maternal Grandmother    Cancer Mother    Diabetes Paternal Grandmother    Heart disease Paternal Grandmother     Social History: See HPI for pertinent social history  ROS: Pertinent ROS in HPI  Physical Exam: BP 134/84   Pulse 61   Ht 5' 8 (1.727 m)   Wt 228 lb (103.4 kg)   BMI 34.67 kg/m   Constitutional:  Well nourished. Alert and oriented, No acute distress. HEENT: [place] AT, moist mucus membranes.  Trachea midline Cardiovascular: No clubbing, cyanosis, or edema. Respiratory: Normal respiratory effort, no increased work of breathing. Neurologic: Grossly intact, no focal deficits, moving all 4 extremities. Psychiatric: Normal mood and affect.  Laboratory Data: See EPIC and HPI  I have reviewed the labs.   Pertinent Imaging:  02/09/24 11:05  Scan Result 0ml    Assessment & Plan:    1. Gross  hematuria - may be secondary to a persistent infection, but also be secondary to radiation - discussed repeating urine culture to ensure the infection was treated appropriately  - if urine culture is negative, discussed the need for further testing  - UA still with micro hem and bacteria - Urine culture pending - start Septra  DS BID x 7 days   2. Prostate cancer - PSA was undetectable in January and he is scheduled to have a repeat PSA in August  Return for Follow up pending labs.  These notes generated with voice recognition software. I apologize for typographical errors.  Tel Hevia, PA-C  [facility] [address]  [address] [place], [place] [address] [phone]

## 2024-02-09 ENCOUNTER — Ambulatory Visit (INDEPENDENT_AMBULATORY_CARE_PROVIDER_SITE_OTHER): Admitting: Urology

## 2024-02-09 VITALS — BP 134/84 | HR 61 | Ht 68.0 in | Wt 228.0 lb

## 2024-02-09 DIAGNOSIS — R310 Gross hematuria: Secondary | ICD-10-CM | POA: Diagnosis not present

## 2024-02-09 DIAGNOSIS — N393 Stress incontinence (female) (male): Secondary | ICD-10-CM

## 2024-02-09 DIAGNOSIS — C61 Malignant neoplasm of prostate: Secondary | ICD-10-CM

## 2024-02-09 LAB — MICROSCOPIC EXAMINATION

## 2024-02-09 LAB — URINALYSIS, COMPLETE
Bilirubin, UA: NEGATIVE
Glucose, UA: NEGATIVE
Ketones, UA: NEGATIVE
Leukocytes,UA: NEGATIVE
Nitrite, UA: NEGATIVE
Protein,UA: NEGATIVE
Specific Gravity, UA: 1.015 (ref 1.005–1.030)
Urobilinogen, Ur: 0.2 mg/dL (ref 0.2–1.0)
pH, UA: 7 (ref 5.0–7.5)

## 2024-02-09 LAB — BLADDER SCAN AMB NON-IMAGING

## 2024-02-09 MED ORDER — SULFAMETHOXAZOLE-TRIMETHOPRIM 800-160 MG PO TABS
1.0000 | ORAL_TABLET | Freq: Two times a day (BID) | ORAL | 0 refills | Status: DC
Start: 2024-02-09 — End: 2024-02-23

## 2024-02-11 ENCOUNTER — Encounter: Payer: Self-pay | Admitting: Urology

## 2024-02-13 ENCOUNTER — Ambulatory Visit: Payer: Self-pay | Admitting: Urology

## 2024-02-13 LAB — CULTURE, URINE COMPREHENSIVE

## 2024-02-15 ENCOUNTER — Ambulatory Visit: Admitting: Urology

## 2024-02-15 ENCOUNTER — Encounter: Payer: Self-pay | Admitting: Urology

## 2024-02-22 NOTE — Telephone Encounter (Signed)
 Pt finished abx this past week and is still having symptoms.  He is having burning at the end of peeing, and urges for frequent urination.

## 2024-02-23 ENCOUNTER — Other Ambulatory Visit: Payer: Self-pay

## 2024-02-23 DIAGNOSIS — R310 Gross hematuria: Secondary | ICD-10-CM

## 2024-02-23 MED ORDER — SULFAMETHOXAZOLE-TRIMETHOPRIM 800-160 MG PO TABS: 1.0000 | ORAL_TABLET | Freq: Two times a day (BID) | ORAL | 0 refills | Status: DC

## 2024-02-29 ENCOUNTER — Other Ambulatory Visit: Payer: Self-pay

## 2024-02-29 DIAGNOSIS — R310 Gross hematuria: Secondary | ICD-10-CM

## 2024-03-01 ENCOUNTER — Other Ambulatory Visit

## 2024-03-01 DIAGNOSIS — R310 Gross hematuria: Secondary | ICD-10-CM | POA: Diagnosis not present

## 2024-03-01 LAB — URINALYSIS, COMPLETE
Bilirubin, UA: NEGATIVE
Glucose, UA: NEGATIVE
Ketones, UA: NEGATIVE
Leukocytes,UA: NEGATIVE
Nitrite, UA: NEGATIVE
Protein,UA: NEGATIVE
Specific Gravity, UA: 1.03 (ref 1.005–1.030)
Urobilinogen, Ur: 0.2 mg/dL (ref 0.2–1.0)
pH, UA: 6 (ref 5.0–7.5)

## 2024-03-01 LAB — MICROSCOPIC EXAMINATION

## 2024-03-05 LAB — CULTURE, URINE COMPREHENSIVE

## 2024-03-06 ENCOUNTER — Telehealth: Payer: Self-pay

## 2024-03-06 NOTE — Telephone Encounter (Signed)
 Patient called complaining of blood and dysuria.  He would like to know what to do, he has had this problem a few months now.

## 2024-03-11 ENCOUNTER — Inpatient Hospital Stay: Payer: BC Managed Care – PPO | Attending: Radiation Oncology

## 2024-03-11 DIAGNOSIS — C61 Malignant neoplasm of prostate: Secondary | ICD-10-CM | POA: Diagnosis not present

## 2024-03-11 LAB — PSA: Prostatic Specific Antigen: 0.01 ng/mL (ref 0.00–4.00)

## 2024-03-14 ENCOUNTER — Other Ambulatory Visit: Payer: Self-pay | Admitting: *Deleted

## 2024-03-14 ENCOUNTER — Encounter: Payer: Self-pay | Admitting: Radiation Oncology

## 2024-03-14 ENCOUNTER — Ambulatory Visit
Admission: RE | Admit: 2024-03-14 | Discharge: 2024-03-14 | Disposition: A | Discharge status: Home / Self Care | Payer: BC Managed Care – PPO | Source: Ambulatory Visit | Attending: Radiation Oncology | Admitting: Radiation Oncology

## 2024-03-14 VITALS — BP 135/85 | HR 90 | Temp 97.9°F | Resp 16 | Wt 236.0 lb

## 2024-03-14 DIAGNOSIS — Z923 Personal history of irradiation: Secondary | ICD-10-CM | POA: Diagnosis not present

## 2024-03-14 DIAGNOSIS — C61 Malignant neoplasm of prostate: Secondary | ICD-10-CM

## 2024-03-14 DIAGNOSIS — Z191 Hormone sensitive malignancy status: Secondary | ICD-10-CM | POA: Diagnosis not present

## 2024-03-14 NOTE — Progress Notes (Signed)
 Radiation Oncology Follow up Note  Name: Tyler Peters   Date:   03/14/2024 MRN:  [id number] DOB: Oct 28, 1960    This 63 y.o. 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  He is also passed some clots he is otherwise has minimal lower urinary tract symptoms.  No diarrhea or fatigue.  His most recent PSA is 0.01 he initially was seen in the ED for hematuria and was found to have a UTI which has since cleared.  Patient is currently on Flomax .  COMPLICATIONS OF TREATMENT: none  FOLLOW UP COMPLIANCE: keeps appointments   PHYSICAL EXAM:  BP 135/85   Pulse 90   Temp 97.9 F (36.6 C) (Tympanic)   Resp 16   Wt 236 lb (107 kg)   BMI 35.88 kg/m  Well-developed well-nourished patient in NAD. HEENT reveals PERLA, EOMI, discs not visualized.  Oral cavity is clear. No oral mucosal lesions are identified. Neck is clear without evidence of cervical or supraclavicular adenopathy. Lungs are clear to A&P. Cardiac examination is essentially unremarkable with regular rate and rhythm without murmur rub or thrill. Abdomen is benign with no organomegaly or masses noted. Motor sensory and DTR levels are equal and symmetric in the upper and lower extremities. Cranial nerves II through XII are grossly intact. Proprioception is intact. No peripheral adenopathy or edema is identified. No motor or sensory levels are noted. Crude visual fields are within  normal range.  RADIOLOGY RESULTS: No current films to review  PLAN: Present time patient will have cystoscopy performed for evaluation of hematuria.  It seems that his urinary tract infection has cleared with Septra .  I am pleased with his overall response and biochemical level of PSA.  I have asked to see him back in 6 months for follow-up with repeat PSA.  Patient continues close follow-up care through urology.  I would like to take this opportunity to thank you for allowing me to participate in the care of your patient.SABRA Marcey Penton, MD

## 2024-03-21 ENCOUNTER — Ambulatory Visit: Admitting: Urology

## 2024-03-27 ENCOUNTER — Ambulatory Visit (INDEPENDENT_AMBULATORY_CARE_PROVIDER_SITE_OTHER): Payer: Self-pay | Admitting: Urology

## 2024-03-27 ENCOUNTER — Other Ambulatory Visit: Admitting: Urology

## 2024-03-27 VITALS — BP 152/98 | HR 84 | Wt 234.0 lb

## 2024-03-27 DIAGNOSIS — N393 Stress incontinence (female) (male): Secondary | ICD-10-CM | POA: Diagnosis not present

## 2024-03-27 DIAGNOSIS — R3916 Straining to void: Secondary | ICD-10-CM

## 2024-03-27 DIAGNOSIS — N3040 Irradiation cystitis without hematuria: Secondary | ICD-10-CM

## 2024-03-27 LAB — BLADDER SCAN AMB NON-IMAGING

## 2024-03-27 MED ORDER — CEPHALEXIN 500 MG PO CAPS: 500.0000 mg | ORAL_CAPSULE | Freq: Once | ORAL | Status: AC

## 2024-03-27 MED ORDER — TRIMETHOPRIM 100 MG PO TABS: 100.0000 mg | ORAL_TABLET | Freq: Every day | ORAL | 0 refills | Status: AC

## 2024-03-27 NOTE — Patient Instructions (Signed)
 Hyperbaric oxygen therapy (HBOT) is an effective and safe treatment for radiation-induced cystitis that can relieve symptoms and promote tissue healing by increasing oxygen delivery to damaged areas, stimulating new blood vessel growth (angiogenesis), and restoring normal cellular function. It involves breathing 100% oxygen in a pressurized chamber, in sessions over several weeks. While it addresses symptoms like bleeding, urgency, and pain, it may not fully reverse damage if severe fibrosis is present.

## 2024-03-27 NOTE — Addendum Note (Signed)
 Addended by: Manny Vitolo A on: 03/27/2024 04:12 PM   Modules accepted: Orders

## 2024-03-27 NOTE — Progress Notes (Signed)
 Cystoscopy Procedure Note:  Indication: Urinary symptoms  High risk prostate cancer treated with RALP+LN 12/12/2022, extraprostatic extension and positive margin, postop PSA 0.5 and underwent salvage radiation and 42-month ADT injection PSA undetectable since that time, most recently 03/11/2024 Cystoscopy November 2024 with tight bladder neck and calcification, unable to access bladder, he deferred OR for further evaluation based on high risk of incontinence with dilation Recent UTI June 2025 treated with antibiotics, some persistent gross hematuria and dysuria despite negative culture  After informed consent and discussion of the procedure and its risks, Tyler Peters was positioned and prepped in the standard fashion. Cystoscopy was performed with a flexible cystoscope. The urethra, bladder neck and entire bladder was visualized in a standard fashion. The prostate was surgically absent.  Moderate erythema around the bladder neck consistent with radiation cystitis, no other bladder abnormalities.  Possible very subtle small calcification near the bladder neck.  The ureteral orifices were visualized in their normal location and orientation.  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 as etiology of gross hematuria and dysuria  Assessment and Plan: We discussed options including hyperbaric oxygen treatment or OR for biopsy and fulguration, high risk of worsening incontinence with rigid cystoscopy and biopsy and he defers at this time which I think is very reasonable Referral placed to wound care to consider hyperbaric oxygen therapy for radiation cystitis with symptoms of dysuria and gross hematuria Low-dose antibiotic prophylaxis x 90 days No improvement in ED on Cialis , will discuss alternative options at follow-up RTC 3 months symptom check  Tyler Burnet, MD 03/27/2024

## 2024-04-05 ENCOUNTER — Encounter: Admitting: Internal Medicine

## 2024-04-09 ENCOUNTER — Ambulatory Visit (INDEPENDENT_AMBULATORY_CARE_PROVIDER_SITE_OTHER): Admitting: Internal Medicine

## 2024-04-09 VITALS — BP 116/74 | HR 90 | Ht 68.0 in | Wt 235.4 lb

## 2024-04-09 DIAGNOSIS — E782 Mixed hyperlipidemia: Secondary | ICD-10-CM | POA: Diagnosis not present

## 2024-04-09 DIAGNOSIS — R5383 Other fatigue: Secondary | ICD-10-CM

## 2024-04-09 DIAGNOSIS — E6609 Other obesity due to excess calories: Secondary | ICD-10-CM

## 2024-04-09 DIAGNOSIS — N3040 Irradiation cystitis without hematuria: Secondary | ICD-10-CM

## 2024-04-09 DIAGNOSIS — R7303 Prediabetes: Secondary | ICD-10-CM

## 2024-04-09 DIAGNOSIS — Z6835 Body mass index (BMI) 35.0-35.9, adult: Secondary | ICD-10-CM

## 2024-04-09 DIAGNOSIS — G4733 Obstructive sleep apnea (adult) (pediatric): Secondary | ICD-10-CM

## 2024-04-09 DIAGNOSIS — I10 Essential (primary) hypertension: Secondary | ICD-10-CM

## 2024-04-09 DIAGNOSIS — Z0000 Encounter for general adult medical examination without abnormal findings: Secondary | ICD-10-CM

## 2024-04-09 DIAGNOSIS — E66812 Obesity, class 2: Secondary | ICD-10-CM

## 2024-04-09 DIAGNOSIS — Z8546 Personal history of malignant neoplasm of prostate: Secondary | ICD-10-CM

## 2024-04-09 DIAGNOSIS — C61 Malignant neoplasm of prostate: Secondary | ICD-10-CM

## 2024-04-09 MED ORDER — LOSARTAN POTASSIUM 50 MG PO TABS: 50.0000 mg | ORAL_TABLET | Freq: Every day | ORAL | 3 refills | Status: AC

## 2024-04-09 NOTE — Progress Notes (Unsigned)
 Patient ID: Tyler Peters, male    DOB: 01/23/1961  Age: 63 y.o. MRN: [id number]  The patient is here for annual preventive examination and management of other chronic and acute problems.   The risk factors are reflected in the social history.   The roster of all physicians providing medical care to patient - is listed in the Snapshot section of the chart.   Activities of daily living:  The patient is 100% independent in all ADLs: dressing, toileting, feeding as well as independent mobility   Home safety : The patient has smoke detectors in the home. They wear seatbelts.  There are no unsecured firearms at home. There is no violence in the home.    There is no risks for hepatitis, STDs or HIV. There is no   history of blood transfusion. They have no travel history to infectious disease endemic areas of the world.   The patient has seen their dentist in the last six month. They have seen their eye doctor in the last year. The patinet  denies slight hearing difficulty with regard to whispered voices and some television programs.  They have deferred audiologic testing in the last year.  They do not  have excessive sun exposure. Discussed the need for sun protection: hats, long sleeves and use of sunscreen if there is significant sun exposure.    Diet: the importance of a healthy diet is discussed. They do have a healthy diet.   The benefits of regular aerobic exercise were discussed. The patient  exercises  3 to 5 days per week  for  60 minutes.    Depression screen: there are no signs or vegative symptoms of depression- irritability, change in appetite, anhedonia, sadness/tearfullness.   The following portions of the patient's history were reviewed and updated as appropriate: allergies, current medications, past family history, past medical history,  past surgical history, past social history  and problem list.   Visual acuity was not assessed per patient preference since the patient has regular  follow up with an  ophthalmologist. Hearing and body mass index were assessed and reviewed.    During the course of the visit the patient was educated and counseled about appropriate screening and preventive services including : fall prevention , diabetes screening, nutrition counseling, colorectal cancer screening, and recommended immunizations.    Chief Complaint:  Prostate Cancer:  has had several UTIs this summer with hematuria diagnosed with radiation cystitis,  by cystoscopy.   Plans to to heal involve starting  Hyperbaric chamber october 1   Sleeping well.  Averages 6 to 7 hours, ,  one void per night .    no constipation .  Using CPAP  OSA;  Diagnosed by prior sleep study. Patient is using CPAP every night a minimum of 6 hours per night and notes improved daytime wakefulness and decreased fatigue  event 1-2/hr  . Side sleeper   using nasal pillows   Eye exam last Fall. Derm last year . 6 month check up no CA     Review of Symptoms  Patient denies headache, fevers, malaise, unintentional weight loss, skin rash, eye pain, sinus congestion and sinus pain, sore throat, dysphagia,  hemoptysis , cough, dyspnea, wheezing, chest pain, palpitations, orthopnea, edema, abdominal pain, nausea, melena, diarrhea, constipation, flank pain,  urinary  Frequency, nocturia, numbness, tingling, seizures,  Focal weakness, Loss of consciousness,  Tremor, insomnia, depression, anxiety, and suicidal ideation.    Physical Exam:  BP 116/74   Pulse 90  Ht 5' 8 (1.727 m)   Wt 235 lb 6.4 oz (106.8 kg)   SpO2 96%   BMI 35.79 kg/m    Physical Exam Vitals reviewed.  Constitutional:      General: He is not in acute distress.    Appearance: Normal appearance. He is obese. He is not ill-appearing, toxic-appearing or diaphoretic.  HENT:     Head: Normocephalic and atraumatic.     Right Ear: Tympanic membrane, ear canal and external ear normal. There is no impacted cerumen.     Left Ear: Tympanic  membrane, ear canal and external ear normal. There is no impacted cerumen.     Nose: Nose normal.     Mouth/Throat:     Mouth: Mucous membranes are moist.     Pharynx: Oropharynx is clear.  Eyes:     General: No scleral icterus.       Right eye: No discharge.        Left eye: No discharge.     Conjunctiva/sclera: Conjunctivae normal.  Neck:     Thyroid : No thyromegaly.     Vascular: No carotid bruit or JVD.  Cardiovascular:     Rate and Rhythm: Normal rate and regular rhythm.     Heart sounds: Normal heart sounds.  Pulmonary:     Effort: Pulmonary effort is normal. No respiratory distress.     Breath sounds: Normal breath sounds.  Abdominal:     General: Bowel sounds are normal.     Palpations: Abdomen is soft. There is no mass.     Tenderness: There is no abdominal tenderness. There is no guarding or rebound.  Musculoskeletal:        General: Normal range of motion.     Cervical back: Normal range of motion and neck supple.  Lymphadenopathy:     Cervical: No cervical adenopathy.  Skin:    General: Skin is warm and dry.  Neurological:     General: No focal deficit present.     Mental Status: He is alert and oriented to person, place, and time. Mental status is at baseline.  Psychiatric:        Mood and Affect: Mood normal.        Behavior: Behavior normal.        Thought Content: Thought content normal.        Judgment: Judgment normal.     Assessment and Plan: White coat syndrome with hypertension -     Comprehensive metabolic panel with GFR  Hyperlipidemia, mixed Assessment & Plan: Current his 10 yr risk of CAD and OSA has improved from  13% . To < 11%  without statin use .  Repeat lipids are pending  Lab Results  Component Value Date   CHOL 186 10/04/2023   HDL 40.90 10/04/2023   LDLCALC 105 (H) 10/04/2023   LDLDIRECT 120.0 10/04/2023   TRIG 200.0 (H) 10/04/2023   CHOLHDL 5 10/04/2023     Orders: -     Lipid panel -     LDL cholesterol,  direct  Prediabetes -     Comprehensive metabolic panel with GFR -     Hemoglobin A1c  Encounter for preventive health examination Assessment & Plan: age appropriate education and counseling updated, referrals for preventative services and immunizations addressed, dietary and smoking counseling addressed, most recent labs reviewed.  I have personally reviewed and have noted:   1) the patient's medical and social history 2) The pt's use of alcohol, tobacco, and illicit drugs 3)  The patient's current medications and supplements 4) Functional ability including ADL's, fall risk, home safety risk, hearing and visual impairment 5) Diet and physical activities 6) Evidence for depression or mood disorder 7) The patient's height, weight, and BMI have been recorded in the chart   I have made referrals, and provided counseling and education based on review of the above    Other fatigue -     CBC with Differential/Platelet -     TSH  History of prostate cancer -     PSA  OSA (obstructive sleep apnea) Assessment & Plan: Diagnosed by sleep study. he is wearing her CPAP every night a minimum of 5 hours per night and notes improved daytime wakefulness and decreased fatigue     Prostate cancer [facility]) Assessment & Plan:  high risk prostate cancer status post robotic radical prostatectomy and lymph node dissection in May 2024, persistent postop PSA of 0.5 and completed salvage rXRT and 57-month ADT injection. Continue surveillance with serial  PSA is undetectable.   Lab Results  Component Value Date   PSA1 <0.1 10/17/2023   PSA1 <0.1 06/19/2023   PSA1 0.5 01/19/2023   PSA 0.00 (L) 04/26/2023   PSA 3.63 04/22/2022   PSA 1.43 06/07/2021       Radiation cystitis Assessment & Plan: Determined by cystoscopy this summer due to recurrent hematuria/pyuria.  For hyperbaric oxygen  therapy starting Oct 1 at [facility] wound [facility]    Class 2 obesity due to excess calories without serious  comorbidity with body mass index (BMI) of 35.0 to 35.9 in adult Assessment & Plan: He has lost weight since his now welcomed layoff.and kept it off.  . I have congratulated him in reduction of   BMI and encouraged  Continued weight loss with goal of 10% of body weight over the next 6 months using a low glycemic index diet and regular exercise a minimum of 5 days per week.     Other orders -     Losartan  Potassium; Take 1 tablet (50 mg total) by mouth at bedtime.  Dispense: 90 tablet; Refill: 3    Return in about 6 months (around 10/07/2024) for hypertension.  Verneita LITTIE Kettering, MD

## 2024-04-10 DIAGNOSIS — N3040 Irradiation cystitis without hematuria: Secondary | ICD-10-CM | POA: Insufficient documentation

## 2024-04-10 LAB — COMPREHENSIVE METABOLIC PANEL WITH GFR
ALT: 13 U/L (ref 0–53)
AST: 14 U/L (ref 0–37)
Albumin: 4.3 g/dL (ref 3.5–5.2)
Alkaline Phosphatase: 60 U/L (ref 39–117)
BUN: 14 mg/dL (ref 6–23)
CO2: 30 meq/L (ref 19–32)
Calcium: 9.4 mg/dL (ref 8.4–10.5)
Chloride: 103 meq/L (ref 96–112)
Creatinine, Ser: 0.98 mg/dL (ref 0.40–1.50)
GFR: 83.67 mL/min (ref >60.00)
Glucose, Bld: 83 mg/dL (ref 70–99)
Potassium: 4 meq/L (ref 3.5–5.1)
Sodium: 141 meq/L (ref 135–145)
Total Bilirubin: 0.5 mg/dL (ref 0.2–1.2)
Total Protein: 6.8 g/dL (ref 6.0–8.3)

## 2024-04-10 LAB — CBC WITH DIFFERENTIAL/PLATELET
Basophils Absolute: 0.1 K/uL (ref 0.0–0.1)
Basophils Relative: 0.9 % (ref 0.0–3.0)
Eosinophils Absolute: 0.2 K/uL (ref 0.0–0.7)
Eosinophils Relative: 3.3 % (ref 0.0–5.0)
HCT: 40.7 % (ref 39.0–52.0)
Hemoglobin: 13.5 g/dL (ref 13.0–17.0)
Lymphocytes Relative: 14.2 % (ref 12.0–46.0)
Lymphs Abs: 0.9 K/uL (ref 0.7–4.0)
MCHC: 33.2 g/dL (ref 30.0–36.0)
MCV: 85.4 fl (ref 78.0–100.0)
Monocytes Absolute: 0.5 K/uL (ref 0.1–1.0)
Monocytes Relative: 8.5 % (ref 3.0–12.0)
Neutro Abs: 4.7 K/uL (ref 1.4–7.7)
Neutrophils Relative %: 73.1 % (ref 43.0–77.0)
Platelets: 293 K/uL (ref 150.0–400.0)
RBC: 4.77 Mil/uL (ref 4.22–5.81)
RDW: 15.3 % (ref 11.5–15.5)
WBC: 6.4 K/uL (ref 4.0–10.5)

## 2024-04-10 LAB — TSH: TSH: 1.34 u[IU]/mL (ref 0.35–5.50)

## 2024-04-10 LAB — LIPID PANEL
Cholesterol: 212 mg/dL — ABNORMAL HIGH (ref 0–200)
HDL: 42.8 mg/dL (ref >39.00)
LDL Cholesterol: 135 mg/dL — ABNORMAL HIGH (ref 0–99)
NonHDL: 168.89
Total CHOL/HDL Ratio: 5
Triglycerides: 167 mg/dL — ABNORMAL HIGH (ref 0.0–149.0)
VLDL: 33.4 mg/dL (ref 0.0–40.0)

## 2024-04-10 LAB — PSA: PSA: 0.02 ng/mL — ABNORMAL LOW (ref 0.10–4.00)

## 2024-04-10 LAB — HEMOGLOBIN A1C: Hgb A1c MFr Bld: 6.2 % (ref 4.6–6.5)

## 2024-04-10 LAB — LDL CHOLESTEROL, DIRECT: Direct LDL: 164 mg/dL

## 2024-04-10 NOTE — Assessment & Plan Note (Signed)

## 2024-04-10 NOTE — Assessment & Plan Note (Signed)
 He has lost weight since his now welcomed layoff.and kept it off.  . I have congratulated him in reduction of   BMI and encouraged  Continued weight loss with goal of 10% of body weight over the next 6 months using a low glycemic index diet and regular exercise a minimum of 5 days per week.

## 2024-04-10 NOTE — Assessment & Plan Note (Signed)
 Determined by cystoscopy this summer due to recurrent hematuria/pyuria.  For hyperbaric oxygen  therapy starting Oct 1 at [facility] wound Center

## 2024-04-10 NOTE — Assessment & Plan Note (Signed)
 Current his 10 yr risk of CAD and OSA has improved from  13% . To < 11%  without statin use .  Repeat lipids are pending  Lab Results  Component Value Date   CHOL 186 10/04/2023   HDL 40.90 10/04/2023   LDLCALC 105 (H) 10/04/2023   LDLDIRECT 120.0 10/04/2023   TRIG 200.0 (H) 10/04/2023   CHOLHDL 5 10/04/2023

## 2024-04-10 NOTE — Assessment & Plan Note (Addendum)
 high risk prostate cancer status post robotic radical prostatectomy and lymph node dissection in May 2024, persistent postop PSA of 0.5 and completed salvage rXRT and 44-month ADT injection. Continue surveillance with serial  PSA is undetectable.   Lab Results  Component Value Date   PSA1 <0.1 10/17/2023   PSA1 <0.1 06/19/2023   PSA1 0.5 01/19/2023   PSA 0.00 (L) 04/26/2023   PSA 3.63 04/22/2022   PSA 1.43 06/07/2021

## 2024-04-10 NOTE — Assessment & Plan Note (Signed)
 Diagnosed by sleep study. he is wearing her CPAP every night a minimum of 5 hours per night and notes improved daytime wakefulness and decreased fatigue

## 2024-04-12 ENCOUNTER — Other Ambulatory Visit: Payer: Self-pay

## 2024-04-12 ENCOUNTER — Ambulatory Visit: Payer: Self-pay | Admitting: Internal Medicine

## 2024-04-20 DIAGNOSIS — G4733 Obstructive sleep apnea (adult) (pediatric): Secondary | ICD-10-CM | POA: Diagnosis not present

## 2024-05-02 ENCOUNTER — Encounter: Attending: Physician Assistant | Admitting: Physician Assistant

## 2024-05-02 DIAGNOSIS — I10 Essential (primary) hypertension: Secondary | ICD-10-CM | POA: Insufficient documentation

## 2024-05-02 DIAGNOSIS — G4730 Sleep apnea, unspecified: Secondary | ICD-10-CM | POA: Diagnosis not present

## 2024-05-02 DIAGNOSIS — N3041 Irradiation cystitis with hematuria: Secondary | ICD-10-CM | POA: Diagnosis not present

## 2024-05-03 ENCOUNTER — Encounter: Admitting: Physician Assistant

## 2024-05-03 DIAGNOSIS — I10 Essential (primary) hypertension: Secondary | ICD-10-CM | POA: Diagnosis not present

## 2024-05-03 DIAGNOSIS — N3041 Irradiation cystitis with hematuria: Secondary | ICD-10-CM | POA: Diagnosis not present

## 2024-05-03 DIAGNOSIS — G4730 Sleep apnea, unspecified: Secondary | ICD-10-CM | POA: Diagnosis not present

## 2024-05-03 DIAGNOSIS — N3040 Irradiation cystitis without hematuria: Secondary | ICD-10-CM | POA: Diagnosis not present

## 2024-05-06 ENCOUNTER — Encounter: Admitting: Physician Assistant

## 2024-05-06 DIAGNOSIS — N3041 Irradiation cystitis with hematuria: Secondary | ICD-10-CM | POA: Diagnosis not present

## 2024-05-06 DIAGNOSIS — I10 Essential (primary) hypertension: Secondary | ICD-10-CM | POA: Diagnosis not present

## 2024-05-06 DIAGNOSIS — N3040 Irradiation cystitis without hematuria: Secondary | ICD-10-CM | POA: Diagnosis not present

## 2024-05-06 DIAGNOSIS — G4730 Sleep apnea, unspecified: Secondary | ICD-10-CM | POA: Diagnosis not present

## 2024-05-07 ENCOUNTER — Encounter: Admitting: Physician Assistant

## 2024-05-07 DIAGNOSIS — N3041 Irradiation cystitis with hematuria: Secondary | ICD-10-CM | POA: Diagnosis not present

## 2024-05-07 DIAGNOSIS — G4730 Sleep apnea, unspecified: Secondary | ICD-10-CM | POA: Diagnosis not present

## 2024-05-07 DIAGNOSIS — I10 Essential (primary) hypertension: Secondary | ICD-10-CM | POA: Diagnosis not present

## 2024-05-07 DIAGNOSIS — N3040 Irradiation cystitis without hematuria: Secondary | ICD-10-CM | POA: Diagnosis not present

## 2024-05-08 ENCOUNTER — Encounter: Admitting: Physician Assistant

## 2024-05-08 DIAGNOSIS — N3041 Irradiation cystitis with hematuria: Secondary | ICD-10-CM | POA: Diagnosis not present

## 2024-05-08 DIAGNOSIS — I10 Essential (primary) hypertension: Secondary | ICD-10-CM | POA: Diagnosis not present

## 2024-05-08 DIAGNOSIS — G4730 Sleep apnea, unspecified: Secondary | ICD-10-CM | POA: Diagnosis not present

## 2024-05-08 DIAGNOSIS — N3040 Irradiation cystitis without hematuria: Secondary | ICD-10-CM | POA: Diagnosis not present

## 2024-05-09 ENCOUNTER — Encounter: Admitting: Physician Assistant

## 2024-05-09 DIAGNOSIS — G4730 Sleep apnea, unspecified: Secondary | ICD-10-CM | POA: Diagnosis not present

## 2024-05-09 DIAGNOSIS — N3041 Irradiation cystitis with hematuria: Secondary | ICD-10-CM | POA: Diagnosis not present

## 2024-05-09 DIAGNOSIS — N3040 Irradiation cystitis without hematuria: Secondary | ICD-10-CM | POA: Diagnosis not present

## 2024-05-09 DIAGNOSIS — I10 Essential (primary) hypertension: Secondary | ICD-10-CM | POA: Diagnosis not present

## 2024-05-10 ENCOUNTER — Encounter: Admitting: Physician Assistant

## 2024-05-10 DIAGNOSIS — N3040 Irradiation cystitis without hematuria: Secondary | ICD-10-CM | POA: Diagnosis not present

## 2024-05-10 DIAGNOSIS — N3041 Irradiation cystitis with hematuria: Secondary | ICD-10-CM | POA: Diagnosis not present

## 2024-05-10 DIAGNOSIS — G4730 Sleep apnea, unspecified: Secondary | ICD-10-CM | POA: Diagnosis not present

## 2024-05-10 DIAGNOSIS — I10 Essential (primary) hypertension: Secondary | ICD-10-CM | POA: Diagnosis not present

## 2024-05-13 ENCOUNTER — Encounter: Admitting: Physician Assistant

## 2024-05-13 DIAGNOSIS — N3041 Irradiation cystitis with hematuria: Secondary | ICD-10-CM | POA: Diagnosis not present

## 2024-05-13 DIAGNOSIS — G4730 Sleep apnea, unspecified: Secondary | ICD-10-CM | POA: Diagnosis not present

## 2024-05-13 DIAGNOSIS — I10 Essential (primary) hypertension: Secondary | ICD-10-CM | POA: Diagnosis not present

## 2024-05-14 ENCOUNTER — Encounter: Admitting: Physician Assistant

## 2024-05-14 DIAGNOSIS — N3040 Irradiation cystitis without hematuria: Secondary | ICD-10-CM | POA: Diagnosis not present

## 2024-05-14 DIAGNOSIS — G4730 Sleep apnea, unspecified: Secondary | ICD-10-CM | POA: Diagnosis not present

## 2024-05-14 DIAGNOSIS — I10 Essential (primary) hypertension: Secondary | ICD-10-CM | POA: Diagnosis not present

## 2024-05-14 DIAGNOSIS — N3041 Irradiation cystitis with hematuria: Secondary | ICD-10-CM | POA: Diagnosis not present

## 2024-05-15 ENCOUNTER — Encounter: Admitting: Physician Assistant

## 2024-05-15 DIAGNOSIS — I10 Essential (primary) hypertension: Secondary | ICD-10-CM | POA: Diagnosis not present

## 2024-05-15 DIAGNOSIS — G4730 Sleep apnea, unspecified: Secondary | ICD-10-CM | POA: Diagnosis not present

## 2024-05-15 DIAGNOSIS — N3040 Irradiation cystitis without hematuria: Secondary | ICD-10-CM | POA: Diagnosis not present

## 2024-05-15 DIAGNOSIS — N3041 Irradiation cystitis with hematuria: Secondary | ICD-10-CM | POA: Diagnosis not present

## 2024-05-16 ENCOUNTER — Encounter: Admitting: Physician Assistant

## 2024-05-16 DIAGNOSIS — N3041 Irradiation cystitis with hematuria: Secondary | ICD-10-CM | POA: Diagnosis not present

## 2024-05-16 DIAGNOSIS — I10 Essential (primary) hypertension: Secondary | ICD-10-CM | POA: Diagnosis not present

## 2024-05-16 DIAGNOSIS — G4730 Sleep apnea, unspecified: Secondary | ICD-10-CM | POA: Diagnosis not present

## 2024-05-16 DIAGNOSIS — N3040 Irradiation cystitis without hematuria: Secondary | ICD-10-CM | POA: Diagnosis not present

## 2024-05-17 ENCOUNTER — Encounter: Admitting: Physician Assistant

## 2024-05-17 DIAGNOSIS — G4730 Sleep apnea, unspecified: Secondary | ICD-10-CM | POA: Diagnosis not present

## 2024-05-17 DIAGNOSIS — N3041 Irradiation cystitis with hematuria: Secondary | ICD-10-CM | POA: Diagnosis not present

## 2024-05-17 DIAGNOSIS — I10 Essential (primary) hypertension: Secondary | ICD-10-CM | POA: Diagnosis not present

## 2024-05-17 DIAGNOSIS — N3040 Irradiation cystitis without hematuria: Secondary | ICD-10-CM | POA: Diagnosis not present

## 2024-05-20 ENCOUNTER — Encounter: Admitting: Physician Assistant

## 2024-05-20 DIAGNOSIS — I10 Essential (primary) hypertension: Secondary | ICD-10-CM | POA: Diagnosis not present

## 2024-05-20 DIAGNOSIS — G4730 Sleep apnea, unspecified: Secondary | ICD-10-CM | POA: Diagnosis not present

## 2024-05-20 DIAGNOSIS — N3040 Irradiation cystitis without hematuria: Secondary | ICD-10-CM | POA: Diagnosis not present

## 2024-05-20 DIAGNOSIS — N3041 Irradiation cystitis with hematuria: Secondary | ICD-10-CM | POA: Diagnosis not present

## 2024-05-21 ENCOUNTER — Encounter: Admitting: Physician Assistant

## 2024-05-22 ENCOUNTER — Encounter: Admitting: Physician Assistant

## 2024-05-23 ENCOUNTER — Encounter: Admitting: Physician Assistant

## 2024-05-23 DIAGNOSIS — G4730 Sleep apnea, unspecified: Secondary | ICD-10-CM | POA: Diagnosis not present

## 2024-05-23 DIAGNOSIS — N3041 Irradiation cystitis with hematuria: Secondary | ICD-10-CM | POA: Diagnosis not present

## 2024-05-23 DIAGNOSIS — I10 Essential (primary) hypertension: Secondary | ICD-10-CM | POA: Diagnosis not present

## 2024-05-23 DIAGNOSIS — N3040 Irradiation cystitis without hematuria: Secondary | ICD-10-CM | POA: Diagnosis not present

## 2024-05-24 ENCOUNTER — Encounter: Admitting: Physician Assistant

## 2024-05-24 DIAGNOSIS — G4730 Sleep apnea, unspecified: Secondary | ICD-10-CM | POA: Diagnosis not present

## 2024-05-24 DIAGNOSIS — N3041 Irradiation cystitis with hematuria: Secondary | ICD-10-CM | POA: Diagnosis not present

## 2024-05-24 DIAGNOSIS — I10 Essential (primary) hypertension: Secondary | ICD-10-CM | POA: Diagnosis not present

## 2024-05-24 DIAGNOSIS — N3040 Irradiation cystitis without hematuria: Secondary | ICD-10-CM | POA: Diagnosis not present

## 2024-05-27 ENCOUNTER — Encounter: Admitting: Physician Assistant

## 2024-05-27 DIAGNOSIS — N3040 Irradiation cystitis without hematuria: Secondary | ICD-10-CM | POA: Diagnosis not present

## 2024-05-27 DIAGNOSIS — G4730 Sleep apnea, unspecified: Secondary | ICD-10-CM | POA: Diagnosis not present

## 2024-05-27 DIAGNOSIS — N3041 Irradiation cystitis with hematuria: Secondary | ICD-10-CM | POA: Diagnosis not present

## 2024-05-27 DIAGNOSIS — I10 Essential (primary) hypertension: Secondary | ICD-10-CM | POA: Diagnosis not present

## 2024-05-28 ENCOUNTER — Encounter: Admitting: Physician Assistant

## 2024-05-28 DIAGNOSIS — N3040 Irradiation cystitis without hematuria: Secondary | ICD-10-CM | POA: Diagnosis not present

## 2024-05-28 DIAGNOSIS — N3041 Irradiation cystitis with hematuria: Secondary | ICD-10-CM | POA: Diagnosis not present

## 2024-05-28 DIAGNOSIS — G4730 Sleep apnea, unspecified: Secondary | ICD-10-CM | POA: Diagnosis not present

## 2024-05-28 DIAGNOSIS — I10 Essential (primary) hypertension: Secondary | ICD-10-CM | POA: Diagnosis not present

## 2024-05-29 ENCOUNTER — Encounter: Admitting: Physician Assistant

## 2024-05-29 DIAGNOSIS — I10 Essential (primary) hypertension: Secondary | ICD-10-CM | POA: Diagnosis not present

## 2024-05-29 DIAGNOSIS — N3041 Irradiation cystitis with hematuria: Secondary | ICD-10-CM | POA: Diagnosis not present

## 2024-05-29 DIAGNOSIS — G4730 Sleep apnea, unspecified: Secondary | ICD-10-CM | POA: Diagnosis not present

## 2024-05-29 DIAGNOSIS — N3040 Irradiation cystitis without hematuria: Secondary | ICD-10-CM | POA: Diagnosis not present

## 2024-05-30 ENCOUNTER — Encounter: Admitting: Physician Assistant

## 2024-05-30 DIAGNOSIS — I10 Essential (primary) hypertension: Secondary | ICD-10-CM | POA: Diagnosis not present

## 2024-05-30 DIAGNOSIS — G4730 Sleep apnea, unspecified: Secondary | ICD-10-CM | POA: Diagnosis not present

## 2024-05-30 DIAGNOSIS — N3041 Irradiation cystitis with hematuria: Secondary | ICD-10-CM | POA: Diagnosis not present

## 2024-05-30 DIAGNOSIS — N3040 Irradiation cystitis without hematuria: Secondary | ICD-10-CM | POA: Diagnosis not present

## 2024-05-31 ENCOUNTER — Encounter: Admitting: Physician Assistant

## 2024-05-31 DIAGNOSIS — N3041 Irradiation cystitis with hematuria: Secondary | ICD-10-CM | POA: Diagnosis not present

## 2024-05-31 DIAGNOSIS — G4730 Sleep apnea, unspecified: Secondary | ICD-10-CM | POA: Diagnosis not present

## 2024-05-31 DIAGNOSIS — I10 Essential (primary) hypertension: Secondary | ICD-10-CM | POA: Diagnosis not present

## 2024-05-31 DIAGNOSIS — N3040 Irradiation cystitis without hematuria: Secondary | ICD-10-CM | POA: Diagnosis not present

## 2024-06-03 ENCOUNTER — Encounter: Payer: Self-pay | Admitting: Physician Assistant

## 2024-06-03 ENCOUNTER — Encounter: Admitting: Physician Assistant

## 2024-06-04 ENCOUNTER — Encounter: Payer: Self-pay | Admitting: Physician Assistant

## 2024-06-04 ENCOUNTER — Encounter: Admitting: Physician Assistant

## 2024-06-05 ENCOUNTER — Encounter: Admitting: Physician Assistant

## 2024-06-05 ENCOUNTER — Encounter: Payer: Self-pay | Admitting: Physician Assistant

## 2024-06-06 ENCOUNTER — Encounter: Payer: Self-pay | Admitting: Internal Medicine

## 2024-06-06 ENCOUNTER — Encounter: Admitting: Physician Assistant

## 2024-06-07 ENCOUNTER — Encounter: Payer: Self-pay | Admitting: Internal Medicine

## 2024-06-10 ENCOUNTER — Encounter: Payer: Self-pay | Admitting: Physician Assistant

## 2024-06-10 ENCOUNTER — Encounter: Admitting: Physician Assistant

## 2024-06-11 ENCOUNTER — Encounter: Admitting: Physician Assistant

## 2024-06-11 ENCOUNTER — Encounter: Payer: Self-pay | Admitting: Physician Assistant

## 2024-06-12 ENCOUNTER — Encounter: Admitting: Physician Assistant

## 2024-06-12 ENCOUNTER — Encounter: Payer: Self-pay | Admitting: Physician Assistant

## 2024-06-13 ENCOUNTER — Encounter: Payer: Self-pay | Admitting: Physician Assistant

## 2024-06-13 ENCOUNTER — Encounter: Admitting: Physician Assistant

## 2024-06-14 ENCOUNTER — Encounter: Payer: Self-pay | Admitting: Physician Assistant

## 2024-06-17 ENCOUNTER — Encounter: Payer: Self-pay | Admitting: Physician Assistant

## 2024-06-17 ENCOUNTER — Encounter: Admitting: Physician Assistant

## 2024-06-18 ENCOUNTER — Encounter: Payer: Self-pay | Admitting: Physician Assistant

## 2024-06-18 ENCOUNTER — Encounter: Admitting: Physician Assistant

## 2024-06-19 ENCOUNTER — Encounter: Admitting: Physician Assistant

## 2024-06-19 ENCOUNTER — Encounter: Payer: Self-pay | Admitting: Physician Assistant

## 2024-06-20 ENCOUNTER — Encounter: Admitting: Physician Assistant

## 2024-06-20 ENCOUNTER — Encounter: Payer: Self-pay | Admitting: Physician Assistant

## 2024-06-21 ENCOUNTER — Encounter: Payer: Self-pay | Admitting: Physician Assistant

## 2024-06-24 ENCOUNTER — Encounter: Admitting: Physician Assistant

## 2024-06-25 ENCOUNTER — Encounter: Admitting: Physician Assistant

## 2024-06-26 ENCOUNTER — Encounter: Admitting: Physician Assistant

## 2024-06-28 ENCOUNTER — Encounter: Payer: Self-pay | Admitting: Physician Assistant

## 2024-07-03 ENCOUNTER — Ambulatory Visit: Payer: Self-pay | Admitting: Urology

## 2024-07-03 VITALS — BP 127/87 | HR 100 | Ht 68.0 in | Wt 238.0 lb

## 2024-07-03 DIAGNOSIS — N393 Stress incontinence (female) (male): Secondary | ICD-10-CM

## 2024-07-03 DIAGNOSIS — C61 Malignant neoplasm of prostate: Secondary | ICD-10-CM

## 2024-07-03 MED ORDER — SILDENAFIL CITRATE 100 MG PO TABS: 100.0000 mg | ORAL_TABLET | Freq: Every day | ORAL | 6 refills | Status: AC | PRN

## 2024-07-03 NOTE — Progress Notes (Signed)
   07/03/2024 12:58 PM   Tyler Peters 27-Sep-1960 [id number]  Reason for visit: Follow up prostate cancer, radiation cystitis  History: High risk prostate cancer treated with RALP+LN 12/12/2022, extraprostatic extension and positive margin, postop PSA 0.5 and underwent salvage radiation(completed September 2024) and 1 time 86-month ADT injection PSA undetectable since that time, most recently 03/11/2024 UTI June 2025 treated with antibiotics, some persistent gross hematuria and dysuria despite negative culture Cystoscopy August 2025 consistent with radiation cystitis at the bladder neck, referred for hyperbaric oxygen treatments  Physical Exam: BP 127/87 (BP Location: Left Arm, Patient Position: Sitting, Cuff Size: Large)   Pulse 100   Ht 5' 8 (1.727 m)   Wt 238 lb (108 kg)   SpO2 96%   BMI 36.19 kg/m   Imaging/labs: PSA September 2025 0.02  Today: Urinary symptoms of dysuria, frequency/urgency, gross hematuria have all completely resolved after hyperbaric oxygen treatments.  He only completed approximately 50% of the treatments as his insurance ran out\ Mild stress incontinence, not particularly bothersome Persistent ED refractory to daily Cialis , interested in other treatment options  Plan:   Prostate cancer: High risk, treated with radical prostatectomy with extraprostatic extension with positive margin, salvage radiation completed September 2024 with one-time 10-month ADT injection.  Most recent PSA undetectable, reviewed the importance of ongoing close monitoring every 4 months for the next year Urinary symptoms/radiation cystitis: Resolved after hyperbaric oxygen treatments Stress incontinence: Mild, improving, continue Kegel exercises ED: No improvement on Cialis , opted for trial of sildenafil, we reviewed other options like penile injections or penile prosthesis but he is not interested in other options at this time outside of PDE5 inhibitors Repeat PSA next month with  radiation oncology, RTC virtual visit ~6 months and will check PSA prior(currently without insurance and prefers virtual visits/lab visits to keep costs reasonable)   Tyler JAYSON Burnet, MD  [facility] [address] [place], [place] [address] [phone]

## 2024-07-03 NOTE — Patient Instructions (Addendum)
 Dr Lovie [email or web address] for more information about penile prosthesis  Your medication has been sent to [facility]. This is an therapist, occupational that offers medication at a significantly discounted price. You will need to go to the website ([email or web address]) to sign up for an account, give your address and enter your payment method.

## 2024-07-05 ENCOUNTER — Encounter: Payer: Self-pay | Admitting: Physician Assistant

## 2024-07-12 ENCOUNTER — Encounter: Payer: Self-pay | Admitting: Physician Assistant

## 2024-07-19 ENCOUNTER — Encounter: Payer: Self-pay | Admitting: Physician Assistant

## 2024-09-06 ENCOUNTER — Inpatient Hospital Stay: Payer: Self-pay

## 2024-09-06 DIAGNOSIS — C61 Malignant neoplasm of prostate: Secondary | ICD-10-CM

## 2024-09-06 LAB — PSA: Prostatic Specific Antigen: 0.06 ng/mL (ref 0.00–4.00)

## 2024-09-13 ENCOUNTER — Ambulatory Visit: Admitting: Radiation Oncology

## 2024-10-07 ENCOUNTER — Ambulatory Visit: Admitting: Internal Medicine

## 2025-01-01 ENCOUNTER — Telehealth: Payer: Self-pay | Admitting: Urology
